# Patient Record
Sex: Female | Born: 1960 | Race: Black or African American | Hispanic: No
Health system: Midwestern US, Community
[De-identification: ages and names within clinical notes are randomized; demographics above are authoritative.]

## PROBLEM LIST (undated history)

## (undated) DIAGNOSIS — Z1231 Encounter for screening mammogram for malignant neoplasm of breast: Secondary | ICD-10-CM

## (undated) DIAGNOSIS — I4892 Unspecified atrial flutter: Secondary | ICD-10-CM

## (undated) DIAGNOSIS — G43909 Migraine, unspecified, not intractable, without status migrainosus: Secondary | ICD-10-CM

## (undated) DIAGNOSIS — T7840XA Allergy, unspecified, initial encounter: Secondary | ICD-10-CM

## (undated) DIAGNOSIS — F319 Bipolar disorder, unspecified: Secondary | ICD-10-CM

## (undated) DIAGNOSIS — K5790 Diverticulosis of intestine, part unspecified, without perforation or abscess without bleeding: Secondary | ICD-10-CM

## (undated) DIAGNOSIS — I2510 Atherosclerotic heart disease of native coronary artery without angina pectoris: Secondary | ICD-10-CM

## (undated) DIAGNOSIS — R0600 Dyspnea, unspecified: Secondary | ICD-10-CM

## (undated) DIAGNOSIS — J302 Other seasonal allergic rhinitis: Secondary | ICD-10-CM

## (undated) DIAGNOSIS — M069 Rheumatoid arthritis, unspecified: Secondary | ICD-10-CM

## (undated) DIAGNOSIS — B019 Varicella without complication: Secondary | ICD-10-CM

## (undated) DIAGNOSIS — F32A Depression, unspecified: Secondary | ICD-10-CM

## (undated) DIAGNOSIS — K760 Fatty (change of) liver, not elsewhere classified: Secondary | ICD-10-CM

## (undated) DIAGNOSIS — F329 Major depressive disorder, single episode, unspecified: Secondary | ICD-10-CM

## (undated) DIAGNOSIS — M1990 Unspecified osteoarthritis, unspecified site: Secondary | ICD-10-CM

## (undated) DIAGNOSIS — R7611 Nonspecific reaction to tuberculin skin test without active tuberculosis: Secondary | ICD-10-CM

## (undated) DIAGNOSIS — M2550 Pain in unspecified joint: Secondary | ICD-10-CM

## (undated) DIAGNOSIS — I219 Acute myocardial infarction, unspecified: Secondary | ICD-10-CM

## (undated) HISTORY — PX: TUBAL LIGATION: SHX77

## (undated) HISTORY — DX: Bipolar disorder, unspecified: F31.9

## (undated) HISTORY — DX: Other seasonal allergic rhinitis: J30.2

## (undated) HISTORY — DX: Depression, unspecified: F32.A

## (undated) HISTORY — PX: BREAST CYST EXCISION: SHX579

## (undated) HISTORY — PX: APPENDECTOMY: SHX54

## (undated) HISTORY — DX: Fatty (change of) liver, not elsewhere classified: K76.0

## (undated) HISTORY — DX: Pain in unspecified joint: M25.50

## (undated) HISTORY — DX: Dyspnea, unspecified: R06.00

## (undated) HISTORY — PX: INNER EAR SURGERY: SHX679

## (undated) HISTORY — DX: Major depressive disorder, single episode, unspecified: F32.9

## (undated) HISTORY — DX: Migraine, unspecified, not intractable, without status migrainosus: G43.909

## (undated) HISTORY — DX: Allergy, unspecified, initial encounter: T78.40XA

## (undated) HISTORY — DX: Acute myocardial infarction, unspecified: I21.9

## (undated) HISTORY — PX: ABDOMINAL HYSTERECTOMY: SHX81

## (undated) HISTORY — DX: Varicella without complication: B01.9

## (undated) HISTORY — DX: Nonspecific reaction to tuberculin skin test without active tuberculosis: R76.11

## (undated) HISTORY — PX: MYOMECTOMY: SHX85

---

## 2005-08-15 ENCOUNTER — Emergency Department (HOSPITAL_COMMUNITY): Admission: EM | Admit: 2005-08-15 | Discharge: 2005-08-15 | Payer: Self-pay | Admitting: Emergency Medicine

## 2006-02-05 ENCOUNTER — Emergency Department (HOSPITAL_COMMUNITY): Admission: EM | Admit: 2006-02-05 | Discharge: 2006-02-06 | Payer: Self-pay | Admitting: Emergency Medicine

## 2006-10-25 ENCOUNTER — Inpatient Hospital Stay (HOSPITAL_COMMUNITY): Admission: AD | Admit: 2006-10-25 | Discharge: 2006-10-25 | Payer: Self-pay | Admitting: Gynecology

## 2007-06-01 ENCOUNTER — Emergency Department (HOSPITAL_COMMUNITY): Admission: EM | Admit: 2007-06-01 | Discharge: 2007-06-01 | Payer: Self-pay | Admitting: Family Medicine

## 2008-04-06 ENCOUNTER — Emergency Department (HOSPITAL_COMMUNITY): Admission: EM | Admit: 2008-04-06 | Discharge: 2008-04-06 | Payer: Self-pay | Admitting: Emergency Medicine

## 2008-09-24 ENCOUNTER — Emergency Department (HOSPITAL_COMMUNITY): Admission: EM | Admit: 2008-09-24 | Discharge: 2008-09-24 | Payer: Self-pay | Admitting: Emergency Medicine

## 2009-03-11 ENCOUNTER — Emergency Department (HOSPITAL_COMMUNITY): Admission: EM | Admit: 2009-03-11 | Discharge: 2009-03-11 | Payer: Self-pay | Admitting: Emergency Medicine

## 2009-08-25 ENCOUNTER — Emergency Department (HOSPITAL_COMMUNITY): Admission: EM | Admit: 2009-08-25 | Discharge: 2009-08-25 | Payer: Self-pay | Admitting: Emergency Medicine

## 2009-10-15 ENCOUNTER — Emergency Department (HOSPITAL_COMMUNITY): Admission: EM | Admit: 2009-10-15 | Discharge: 2009-10-15 | Payer: Self-pay | Admitting: Emergency Medicine

## 2010-02-15 ENCOUNTER — Emergency Department (HOSPITAL_COMMUNITY): Admission: EM | Admit: 2010-02-15 | Discharge: 2010-02-15 | Payer: Self-pay | Admitting: Emergency Medicine

## 2010-06-18 ENCOUNTER — Emergency Department (HOSPITAL_COMMUNITY): Admission: EM | Admit: 2010-06-18 | Discharge: 2010-06-18 | Payer: Self-pay | Admitting: Emergency Medicine

## 2010-06-20 ENCOUNTER — Emergency Department (HOSPITAL_COMMUNITY): Admission: EM | Admit: 2010-06-20 | Discharge: 2010-06-20 | Payer: Self-pay | Admitting: Emergency Medicine

## 2010-06-22 ENCOUNTER — Emergency Department (HOSPITAL_COMMUNITY): Admission: EM | Admit: 2010-06-22 | Discharge: 2010-06-22 | Payer: Self-pay | Admitting: Emergency Medicine

## 2010-08-28 ENCOUNTER — Emergency Department (HOSPITAL_COMMUNITY): Admission: EM | Admit: 2010-08-28 | Discharge: 2010-08-28 | Payer: Self-pay | Admitting: Emergency Medicine

## 2010-09-10 ENCOUNTER — Emergency Department (HOSPITAL_COMMUNITY): Admission: EM | Admit: 2010-09-10 | Discharge: 2010-09-10 | Payer: Self-pay | Admitting: Emergency Medicine

## 2011-01-09 ENCOUNTER — Encounter: Payer: Self-pay | Admitting: Internal Medicine

## 2011-02-12 ENCOUNTER — Emergency Department (HOSPITAL_COMMUNITY)
Admission: EM | Admit: 2011-02-12 | Discharge: 2011-02-12 | Disposition: A | Discharge status: Home / Self Care | Attending: Emergency Medicine | Admitting: Emergency Medicine

## 2011-02-12 ENCOUNTER — Emergency Department (HOSPITAL_COMMUNITY)

## 2011-02-12 DIAGNOSIS — R071 Chest pain on breathing: Secondary | ICD-10-CM | POA: Insufficient documentation

## 2011-02-12 DIAGNOSIS — J45909 Unspecified asthma, uncomplicated: Secondary | ICD-10-CM | POA: Insufficient documentation

## 2011-02-12 DIAGNOSIS — E119 Type 2 diabetes mellitus without complications: Secondary | ICD-10-CM | POA: Insufficient documentation

## 2011-02-12 DIAGNOSIS — Z79899 Other long term (current) drug therapy: Secondary | ICD-10-CM | POA: Insufficient documentation

## 2011-02-12 DIAGNOSIS — F319 Bipolar disorder, unspecified: Secondary | ICD-10-CM | POA: Insufficient documentation

## 2011-02-12 LAB — DIFFERENTIAL
Basophils Relative: 0 % (ref 0–1)
Eosinophils Absolute: 0.2 10*3/uL (ref 0.0–0.7)
Eosinophils Relative: 1 % (ref 0–5)
Lymphs Abs: 5.3 10*3/uL — ABNORMAL HIGH (ref 0.7–4.0)
Neutrophils Relative %: 50 % (ref 43–77)

## 2011-02-12 LAB — POCT CARDIAC MARKERS
CKMB, poc: <1 ng/mL — ABNORMAL LOW (ref 1.0–8.0)
Myoglobin, poc: 50 ng/mL (ref 12–200)
Troponin i, poc: <0.05 ng/mL (ref 0.00–0.09)

## 2011-02-12 LAB — CBC
MCV: 84.4 fL (ref 78.0–100.0)
Platelets: 222 10*3/uL (ref 150–400)
RDW: 13.4 % (ref 11.5–15.5)
WBC: 12.5 10*3/uL — ABNORMAL HIGH (ref 4.0–10.5)

## 2011-02-12 LAB — BASIC METABOLIC PANEL
BUN: 17 mg/dL (ref 6–23)
Creatinine, Ser: 0.77 mg/dL (ref 0.4–1.2)
GFR calc Af Amer: >60 mL/min (ref >60)
GFR calc non Af Amer: >60 mL/min (ref >60)
Potassium: 3.8 mEq/L (ref 3.5–5.1)

## 2011-02-22 ENCOUNTER — Emergency Department (HOSPITAL_COMMUNITY)

## 2011-02-22 ENCOUNTER — Emergency Department (HOSPITAL_COMMUNITY)
Admission: EM | Admit: 2011-02-22 | Discharge: 2011-02-22 | Disposition: A | Discharge status: Home / Self Care | Attending: Emergency Medicine | Admitting: Emergency Medicine

## 2011-02-22 DIAGNOSIS — R112 Nausea with vomiting, unspecified: Secondary | ICD-10-CM | POA: Insufficient documentation

## 2011-02-22 DIAGNOSIS — F319 Bipolar disorder, unspecified: Secondary | ICD-10-CM | POA: Insufficient documentation

## 2011-02-22 DIAGNOSIS — R079 Chest pain, unspecified: Secondary | ICD-10-CM | POA: Insufficient documentation

## 2011-02-22 DIAGNOSIS — J45909 Unspecified asthma, uncomplicated: Secondary | ICD-10-CM | POA: Insufficient documentation

## 2011-02-22 DIAGNOSIS — Z79899 Other long term (current) drug therapy: Secondary | ICD-10-CM | POA: Insufficient documentation

## 2011-02-22 DIAGNOSIS — E119 Type 2 diabetes mellitus without complications: Secondary | ICD-10-CM | POA: Insufficient documentation

## 2011-02-22 DIAGNOSIS — R1011 Right upper quadrant pain: Secondary | ICD-10-CM | POA: Insufficient documentation

## 2011-02-22 LAB — CK TOTAL AND CKMB (NOT AT ARMC)
Relative Index: 0.6 (ref 0.0–2.5)
Total CK: 160 U/L (ref 7–177)

## 2011-02-22 LAB — DIFFERENTIAL
Basophils Absolute: 0 K/uL (ref 0.0–0.1)
Basophils Relative: 0 % (ref 0–1)
Eosinophils Absolute: 0.1 10*3/uL (ref 0.0–0.7)
Eosinophils Relative: 1 % (ref 0–5)
Lymphocytes Relative: 37 % (ref 12–46)
Lymphs Abs: 3.6 10*3/uL (ref 0.7–4.0)
Monocytes Absolute: 0.6 K/uL (ref 0.1–1.0)
Monocytes Relative: 6 % (ref 3–12)
Neutro Abs: 5.5 10*3/uL (ref 1.7–7.7)
Neutrophils Relative %: 56 % (ref 43–77)

## 2011-02-22 LAB — URINALYSIS, ROUTINE W REFLEX MICROSCOPIC
Bilirubin Urine: NEGATIVE
Glucose, UA: NEGATIVE mg/dL
Hgb urine dipstick: NEGATIVE
Ketones, ur: NEGATIVE mg/dL
Leukocytes, UA: NEGATIVE
Nitrite: NEGATIVE
Protein, ur: 30 mg/dL — AB
Specific Gravity, Urine: 1.023 (ref 1.005–1.030)
Urobilinogen, UA: 0.2 mg/dL (ref 0.0–1.0)
pH: 8 (ref 5.0–8.0)

## 2011-02-22 LAB — COMPREHENSIVE METABOLIC PANEL WITH GFR
BUN: 9 mg/dL (ref 6–23)
CO2: 28 meq/L (ref 19–32)
Calcium: 9.5 mg/dL (ref 8.4–10.5)
Chloride: 105 meq/L (ref 96–112)
Creatinine, Ser: 0.79 mg/dL (ref 0.4–1.2)
GFR calc Af Amer: >60 mL/min (ref >60)
GFR calc non Af Amer: >60 mL/min (ref >60)
Glucose, Bld: 126 mg/dL — ABNORMAL HIGH (ref 70–99)
Total Bilirubin: 0.8 mg/dL (ref 0.3–1.2)

## 2011-02-22 LAB — CBC
HCT: 40.7 % (ref 36.0–46.0)
Hemoglobin: 13.3 g/dL (ref 12.0–15.0)
MCH: 27.2 pg (ref 26.0–34.0)
MCHC: 32.7 g/dL (ref 30.0–36.0)
MCV: 83.2 fL (ref 78.0–100.0)
Platelets: 216 10*3/uL (ref 150–400)
RBC: 4.89 MIL/uL (ref 3.87–5.11)
RDW: 13 % (ref 11.5–15.5)
WBC: 9.8 10*3/uL (ref 4.0–10.5)

## 2011-02-22 LAB — LIPASE, BLOOD: Lipase: 23 U/L (ref 11–59)

## 2011-02-22 LAB — COMPREHENSIVE METABOLIC PANEL
ALT: 29 U/L (ref 0–35)
AST: 23 U/L (ref 0–37)
Albumin: 3.8 g/dL (ref 3.5–5.2)
Alkaline Phosphatase: 75 U/L (ref 39–117)
Potassium: 3.9 mEq/L (ref 3.5–5.1)
Sodium: 141 mEq/L (ref 135–145)
Total Protein: 7.2 g/dL (ref 6.0–8.3)

## 2011-02-22 LAB — URINE MICROSCOPIC-ADD ON

## 2011-02-22 LAB — POCT PREGNANCY, URINE: Preg Test, Ur: NEGATIVE

## 2011-02-22 LAB — TROPONIN I: Troponin I: 0.01 ng/mL (ref 0.00–0.06)

## 2011-03-02 LAB — PULMONARY FUNCTION TEST

## 2011-03-06 LAB — URINALYSIS, ROUTINE W REFLEX MICROSCOPIC
Bilirubin Urine: NEGATIVE
Ketones, ur: NEGATIVE mg/dL
Nitrite: NEGATIVE
pH: 5.5 (ref 5.0–8.0)

## 2011-03-06 LAB — POCT CARDIAC MARKERS
CKMB, poc: <1 ng/mL — ABNORMAL LOW (ref 1.0–8.0)
CKMB, poc: <1 ng/mL — ABNORMAL LOW (ref 1.0–8.0)
Myoglobin, poc: 56.1 ng/mL (ref 12–200)
Troponin i, poc: <0.05 ng/mL (ref 0.00–0.09)

## 2011-03-06 LAB — HEPATIC FUNCTION PANEL
Albumin: 3.5 g/dL (ref 3.5–5.2)
Indirect Bilirubin: 0.4 mg/dL (ref 0.3–0.9)
Total Bilirubin: 0.7 mg/dL (ref 0.3–1.2)
Total Protein: 6.8 g/dL (ref 6.0–8.3)

## 2011-03-06 LAB — POCT I-STAT, CHEM 8
Creatinine, Ser: 0.9 mg/dL (ref 0.4–1.2)
HCT: 41 % (ref 36.0–46.0)
Hemoglobin: 13.9 g/dL (ref 12.0–15.0)
Potassium: 3.8 mEq/L (ref 3.5–5.1)
Sodium: 141 mEq/L (ref 135–145)

## 2011-03-06 LAB — CBC
HCT: 42.1 % (ref 36.0–46.0)
Hemoglobin: 14.2 g/dL (ref 12.0–15.0)
RBC: 4.95 MIL/uL (ref 3.87–5.11)
WBC: 12.5 10*3/uL — ABNORMAL HIGH (ref 4.0–10.5)

## 2011-03-06 LAB — DIFFERENTIAL
Lymphocytes Relative: 31 % (ref 12–46)
Monocytes Absolute: 0.7 10*3/uL (ref 0.1–1.0)
Monocytes Relative: 5 % (ref 3–12)
Neutro Abs: 7.8 10*3/uL — ABNORMAL HIGH (ref 1.7–7.7)
Neutrophils Relative %: 62 % (ref 43–77)

## 2011-03-06 LAB — BASIC METABOLIC PANEL
GFR calc Af Amer: >60 mL/min (ref >60)
GFR calc non Af Amer: >60 mL/min (ref >60)
Glucose, Bld: 130 mg/dL — ABNORMAL HIGH (ref 70–99)
Potassium: 3.7 mEq/L (ref 3.5–5.1)
Sodium: 138 mEq/L (ref 135–145)

## 2011-03-06 LAB — LIPASE, BLOOD: Lipase: 48 U/L (ref 11–59)

## 2011-03-06 LAB — URINE MICROSCOPIC-ADD ON

## 2011-03-15 ENCOUNTER — Emergency Department (HOSPITAL_COMMUNITY)
Admission: EM | Admit: 2011-03-15 | Discharge: 2011-03-15 | Disposition: A | Discharge status: Home / Self Care | Attending: Emergency Medicine | Admitting: Emergency Medicine

## 2011-03-15 ENCOUNTER — Emergency Department (HOSPITAL_COMMUNITY)

## 2011-03-15 DIAGNOSIS — L298 Other pruritus: Secondary | ICD-10-CM | POA: Insufficient documentation

## 2011-03-15 DIAGNOSIS — J309 Allergic rhinitis, unspecified: Secondary | ICD-10-CM | POA: Insufficient documentation

## 2011-03-15 DIAGNOSIS — L2989 Other pruritus: Secondary | ICD-10-CM | POA: Insufficient documentation

## 2011-03-15 DIAGNOSIS — R071 Chest pain on breathing: Secondary | ICD-10-CM | POA: Insufficient documentation

## 2011-03-15 DIAGNOSIS — J45909 Unspecified asthma, uncomplicated: Secondary | ICD-10-CM | POA: Insufficient documentation

## 2011-03-15 DIAGNOSIS — Z79899 Other long term (current) drug therapy: Secondary | ICD-10-CM | POA: Insufficient documentation

## 2011-03-15 DIAGNOSIS — F319 Bipolar disorder, unspecified: Secondary | ICD-10-CM | POA: Insufficient documentation

## 2011-03-15 DIAGNOSIS — R05 Cough: Secondary | ICD-10-CM | POA: Insufficient documentation

## 2011-03-15 DIAGNOSIS — E119 Type 2 diabetes mellitus without complications: Secondary | ICD-10-CM | POA: Insufficient documentation

## 2011-03-15 DIAGNOSIS — R059 Cough, unspecified: Secondary | ICD-10-CM | POA: Insufficient documentation

## 2011-03-15 DIAGNOSIS — R0989 Other specified symptoms and signs involving the circulatory and respiratory systems: Secondary | ICD-10-CM | POA: Insufficient documentation

## 2011-03-15 DIAGNOSIS — J3489 Other specified disorders of nose and nasal sinuses: Secondary | ICD-10-CM | POA: Insufficient documentation

## 2011-03-15 DIAGNOSIS — R0609 Other forms of dyspnea: Secondary | ICD-10-CM | POA: Insufficient documentation

## 2011-03-20 ENCOUNTER — Encounter (HOSPITAL_COMMUNITY): Payer: Self-pay | Admitting: Radiology

## 2011-03-20 ENCOUNTER — Emergency Department (HOSPITAL_COMMUNITY)

## 2011-03-20 ENCOUNTER — Emergency Department (HOSPITAL_COMMUNITY)
Admission: EM | Admit: 2011-03-20 | Discharge: 2011-03-20 | Disposition: A | Discharge status: Home / Self Care | Attending: Emergency Medicine | Admitting: Emergency Medicine

## 2011-03-20 DIAGNOSIS — R0989 Other specified symptoms and signs involving the circulatory and respiratory systems: Secondary | ICD-10-CM | POA: Insufficient documentation

## 2011-03-20 DIAGNOSIS — E119 Type 2 diabetes mellitus without complications: Secondary | ICD-10-CM | POA: Insufficient documentation

## 2011-03-20 DIAGNOSIS — Z79899 Other long term (current) drug therapy: Secondary | ICD-10-CM | POA: Insufficient documentation

## 2011-03-20 DIAGNOSIS — F319 Bipolar disorder, unspecified: Secondary | ICD-10-CM | POA: Insufficient documentation

## 2011-03-20 DIAGNOSIS — R0602 Shortness of breath: Secondary | ICD-10-CM | POA: Insufficient documentation

## 2011-03-20 DIAGNOSIS — R071 Chest pain on breathing: Secondary | ICD-10-CM | POA: Insufficient documentation

## 2011-03-20 DIAGNOSIS — R0609 Other forms of dyspnea: Secondary | ICD-10-CM | POA: Insufficient documentation

## 2011-03-20 DIAGNOSIS — J45909 Unspecified asthma, uncomplicated: Secondary | ICD-10-CM | POA: Insufficient documentation

## 2011-03-20 MED ORDER — IOHEXOL 300 MG/ML  SOLN
100.0000 mL | Freq: Once | INTRAMUSCULAR | Status: AC | PRN
  Administered 2011-03-20: 100 mL via INTRAVENOUS

## 2011-03-24 LAB — URINE CULTURE: Colony Count: 30000

## 2011-03-24 LAB — URINALYSIS, ROUTINE W REFLEX MICROSCOPIC
Bilirubin Urine: NEGATIVE
Ketones, ur: 15 mg/dL — AB
Leukocytes, UA: NEGATIVE
Nitrite: NEGATIVE
Urobilinogen, UA: 1 mg/dL (ref 0.0–1.0)
pH: 5.5 (ref 5.0–8.0)

## 2011-03-24 LAB — PREGNANCY, URINE: Preg Test, Ur: NEGATIVE

## 2011-03-24 LAB — URINE MICROSCOPIC-ADD ON

## 2011-03-31 LAB — POCT CARDIAC MARKERS
Myoglobin, poc: 47.8 ng/mL (ref 12–200)
Troponin i, poc: <0.05 ng/mL (ref 0.00–0.09)
Troponin i, poc: <0.05 ng/mL (ref 0.00–0.09)

## 2011-03-31 LAB — POCT I-STAT, CHEM 8
BUN: 16 mg/dL (ref 6–23)
Calcium, Ion: 1.18 mmol/L (ref 1.12–1.32)
Chloride: 105 mEq/L (ref 96–112)
Creatinine, Ser: 0.9 mg/dL (ref 0.4–1.2)
Glucose, Bld: 91 mg/dL (ref 70–99)
TCO2: 27 mmol/L (ref 0–100)

## 2011-03-31 LAB — CBC
HCT: 39.4 % (ref 36.0–46.0)
Hemoglobin: 13.5 g/dL (ref 12.0–15.0)
RBC: 4.66 MIL/uL (ref 3.87–5.11)
WBC: 8.9 10*3/uL (ref 4.0–10.5)

## 2011-03-31 LAB — DIFFERENTIAL
Eosinophils Absolute: 0.1 10*3/uL (ref 0.0–0.7)
Eosinophils Relative: 2 % (ref 0–5)
Lymphocytes Relative: 34 % (ref 12–46)
Lymphs Abs: 3 10*3/uL (ref 0.7–4.0)
Monocytes Absolute: 0.6 10*3/uL (ref 0.1–1.0)
Monocytes Relative: 7 % (ref 3–12)

## 2011-03-31 LAB — POCT PREGNANCY, URINE: Preg Test, Ur: NEGATIVE

## 2011-03-31 LAB — D-DIMER, QUANTITATIVE: D-Dimer, Quant: 0.23 ug/mL-FEU (ref 0.00–0.48)

## 2011-04-12 ENCOUNTER — Encounter: Payer: Self-pay | Admitting: Pulmonary Disease

## 2011-04-13 ENCOUNTER — Encounter: Payer: Self-pay | Admitting: Pulmonary Disease

## 2011-04-13 ENCOUNTER — Ambulatory Visit (INDEPENDENT_AMBULATORY_CARE_PROVIDER_SITE_OTHER): Admitting: Pulmonary Disease

## 2011-04-13 VITALS — BP 114/64 | HR 73 | Temp 98.4°F

## 2011-04-13 DIAGNOSIS — R0600 Dyspnea, unspecified: Secondary | ICD-10-CM

## 2011-04-13 DIAGNOSIS — R0609 Other forms of dyspnea: Secondary | ICD-10-CM

## 2011-04-13 HISTORY — DX: Dyspnea, unspecified: R06.00

## 2011-04-13 NOTE — Assessment & Plan Note (Addendum)
It is really unclear from the pt's history whether she truly has asthma, or whether this is more of an issue with reflux disease given her atypical chest pain and now hoarseness (although the dysphonia could be from qvar).  I would be hard pressed to commit her to lifelong asthma therapy unless we have something to indicate she has airflow obstruction.  Her recent spiro is normal.  At this point, would like to treat her more aggressively for LPR, and will also set up for methacholine challenge testing after she has been off qvar for 2 weeks. The pt is agreeable to this approach.

## 2011-04-13 NOTE — Progress Notes (Signed)
  Subjective:    Patient ID: Kerri Osborn, female    DOB: [birth year], 50 y.o.   MRN: [id number]  HPI The pt is a 49y/o female who I have been asked to see for dyspnea and atypical cp.  She has a h/o "asthma" dating back 29yrs, but doesn't remember if she has ever had abnormal pfts/spiro.  She has been on prn albuterol only since that time, with breathing issues only periodically.  Starting in Feb of this year, she began to notice worsening sob along with sharp mid chest discomfort.  She had a ct chest recently with no PE, and totally clear lung fields.  She has had spirometry as part of recent Met-test, and these were normal.  She has upcoming cardiac evaluation with Dr. Jacinto Halim.  She has been tried on qvar most recently for last 2 1/2 weeks, and is really unsure if it has helped.  She currently describes a 1 1/2 block doe at moderate pace on flat ground, and will get winded bringing groceries in from the car but not sweeping.  She has had a recent cough that is dry, and now has developed hoarseness/dysphonia.  She denies reflux symptoms, but does have postnasal drip and feels there is something in her throat "rattling".  She denies significant sinus or allergy symptoms.  She does state her weight is up 35 pounds the last one year, but down 15 the last 3 mos.    Review of Systems  Constitutional: Positive for unexpected weight change. Negative for fever.  HENT: Negative for ear pain, nosebleeds, congestion, sore throat, rhinorrhea, sneezing, trouble swallowing, dental problem, postnasal drip and sinus pressure.   Eyes: Negative for redness and itching.  Respiratory: Positive for cough and shortness of breath. Negative for chest tightness and wheezing.   Cardiovascular: Positive for chest pain. Negative for palpitations and leg swelling.  Gastrointestinal: Negative for nausea and vomiting.  Genitourinary: Negative for dysuria.  Musculoskeletal: Negative for joint swelling.  Skin: Negative for rash.    Neurological: Negative for headaches.  Hematological: Does not bruise/bleed easily.  Psychiatric/Behavioral: Positive for dysphoric mood. The patient is nervous/anxious.        Objective:   Physical Exam Constitutional:  Obese female, no acute distress  HENT:  Nares patent without discharge, but deviated septum to right with inflammed mucosa bilat  Oropharynx without exudate, palate and uvula are normal  Eyes:  Perrla, eomi, no scleral icterus  Neck:  No JVD, no TMG  Cardiovascular:  Normal rate, regular rhythm, no rubs or gallops.  No murmurs        Intact distal pulses  Pulmonary :  Normal breath sounds, no stridor or respiratory distress   No rales, rhonchi, or wheezing  Abdominal:  Soft, nondistended, bowel sounds present.  No tenderness noted.   Musculoskeletal:  No lower extremity edema noted.  Lymph Nodes:  No cervical lymphadenopathy noted  Skin:  No cyanosis noted  Neurologic:  Alert, appropriate, moves all 4 extremities without obvious deficit.         Assessment & Plan:

## 2011-04-13 NOTE — Patient Instructions (Signed)
Stop qvar, but can continue albuterol if needed nexium 40mg  one in am and pm for next 2 weeks Take chlorpheniramine 8mg  at bedtime each night for next 2 weeks.  Will schedule for methacholine challenge testing to put the issue of asthma to rest. I will call you with results. Please call if you have worsening breathing issues leading up to your testing.

## 2011-04-18 ENCOUNTER — Ambulatory Visit (INDEPENDENT_AMBULATORY_CARE_PROVIDER_SITE_OTHER): Admitting: Pulmonary Disease

## 2011-04-18 ENCOUNTER — Encounter: Payer: Self-pay | Admitting: Pulmonary Disease

## 2011-04-18 ENCOUNTER — Telehealth: Payer: Self-pay | Admitting: Pulmonary Disease

## 2011-04-18 VITALS — BP 122/70 | HR 82 | Temp 98.5°F | Ht 63.0 in | Wt 204.0 lb

## 2011-04-18 DIAGNOSIS — R0989 Other specified symptoms and signs involving the circulatory and respiratory systems: Secondary | ICD-10-CM

## 2011-04-18 DIAGNOSIS — R05 Cough: Secondary | ICD-10-CM

## 2011-04-18 DIAGNOSIS — R0600 Dyspnea, unspecified: Secondary | ICD-10-CM

## 2011-04-18 MED ORDER — HYDROCOD POLST-CPM POLST ER 10-8 MG PO CP12: 1.0000 | ORAL_CAPSULE | Freq: Two times a day (BID) | ORAL | Status: DC | PRN

## 2011-04-18 MED ORDER — BENZONATATE 100 MG PO CAPS: 200.0000 mg | ORAL_CAPSULE | Freq: Four times a day (QID) | ORAL | Status: AC | PRN

## 2011-04-18 NOTE — Telephone Encounter (Signed)
Pt walked in and requested to be seen.  Therefore pt put on Kerri Osborn's schedule to see Kerri Osborn at 2:15pm.

## 2011-04-18 NOTE — Patient Instructions (Addendum)
See cyclical cough protocol Keep hard candy in your mouth at all times Will cancel methacholine challenge testing Please call me with how things are going one week after completing protocol

## 2011-04-18 NOTE — Progress Notes (Signed)
  Subjective:    Patient ID: Kerri Osborn, female    DOB: Dec 08, 1961, 50 y.o.   MRN: [id number]  HPI The pt comes in today for an acute sick visit.  Last visit, she had symptoms that were primarily suggestive of upper airway disease, but also had a longstanding h/o "asthma".  She was treated for LPR and PND, and scheduled for a methacholine challenge test off qvar.  She comes in today where she has had worsening dry cough, to the point of vomiting.  She has also had both chest heaviness and sharp pain, and feels she is getting more sob.  She has been taking PPI and antihistamine religiously .    Review of Systems  Constitutional: Negative for fever and unexpected weight change.  HENT: Positive for sore throat and sneezing. Negative for ear pain, nosebleeds, congestion, rhinorrhea, trouble swallowing, dental problem, postnasal drip and sinus pressure.   Eyes: Negative for redness and itching.  Respiratory: Positive for cough, chest tightness, shortness of breath and wheezing.   Cardiovascular: Positive for leg swelling. Negative for palpitations.  Gastrointestinal: Positive for nausea. Negative for vomiting.  Genitourinary: Negative for dysuria.  Musculoskeletal: Positive for joint swelling.  Skin: Positive for rash.  Neurological: Positive for light-headedness and headaches.  Hematological: Does not bruise/bleed easily.  Psychiatric/Behavioral: Positive for dysphoric mood. The patient is nervous/anxious.        Objective:   Physical Exam Ow female in nad Nares without purulence or discharge Chest with mild decrease in bs, no wheezing or rhonchi Cor with rrr,  LE without edema, no cyanosis noted.  Alert and oriented, moves all 4        Assessment & Plan:

## 2011-04-18 NOTE — Telephone Encounter (Signed)
Spoke w/ pt and she states she is not feeling any better from 04/13/11 OV w/ KC. Pt c/o hoarseness, dry cough, chest tightness, wheezing. Pt states her cough is constant and does not go away. Pt states she stopped her QVAR, is taking Nexium bid, chlorpheniramine 8 mg at bedtime. Pt states she has been using her albuterol prn but feels it's not really helping. Please advise recs for pt. Thanks  Allergies  Allergen Reactions  . Amoxicillin     Carver Fila, CMA

## 2011-04-24 NOTE — Assessment & Plan Note (Signed)
I suspect is related to her constant coughing and MSK chest discomfort.

## 2011-04-24 NOTE — Assessment & Plan Note (Signed)
I continue to believe the pt's cough is upper airway in origin, and now has taken on a cyclical nature.  I have found no evidence to support the diagnosis of asthma thus far.  I would like her to continue with her PPI and antihistamine, and will now put her on the cyclical cough protocol to try and "break the cycle".  With her current severe coughing, she will not be able to perform a methacholine challenge test, and therefore will cancel for now.

## 2011-04-27 ENCOUNTER — Encounter (HOSPITAL_COMMUNITY)

## 2011-08-29 ENCOUNTER — Emergency Department (HOSPITAL_COMMUNITY)
Admission: EM | Admit: 2011-08-29 | Discharge: 2011-08-30 | Disposition: A | Discharge status: Home / Self Care | Attending: Emergency Medicine | Admitting: Emergency Medicine

## 2011-08-29 DIAGNOSIS — X58XXXA Exposure to other specified factors, initial encounter: Secondary | ICD-10-CM | POA: Insufficient documentation

## 2011-08-29 DIAGNOSIS — R109 Unspecified abdominal pain: Secondary | ICD-10-CM | POA: Insufficient documentation

## 2011-08-29 DIAGNOSIS — E119 Type 2 diabetes mellitus without complications: Secondary | ICD-10-CM | POA: Insufficient documentation

## 2011-08-29 DIAGNOSIS — J45909 Unspecified asthma, uncomplicated: Secondary | ICD-10-CM | POA: Insufficient documentation

## 2011-08-29 DIAGNOSIS — IMO0002 Reserved for concepts with insufficient information to code with codable children: Secondary | ICD-10-CM | POA: Insufficient documentation

## 2011-08-29 DIAGNOSIS — F319 Bipolar disorder, unspecified: Secondary | ICD-10-CM | POA: Insufficient documentation

## 2011-09-04 ENCOUNTER — Emergency Department (HOSPITAL_COMMUNITY)
Admission: EM | Admit: 2011-09-04 | Discharge: 2011-09-04 | Disposition: A | Discharge status: Home / Self Care | Attending: Emergency Medicine | Admitting: Emergency Medicine

## 2011-09-04 DIAGNOSIS — E119 Type 2 diabetes mellitus without complications: Secondary | ICD-10-CM | POA: Insufficient documentation

## 2011-09-04 DIAGNOSIS — F319 Bipolar disorder, unspecified: Secondary | ICD-10-CM | POA: Insufficient documentation

## 2011-09-04 DIAGNOSIS — Z79899 Other long term (current) drug therapy: Secondary | ICD-10-CM | POA: Insufficient documentation

## 2011-09-04 LAB — DIFFERENTIAL
Lymphs Abs: 5.2 10*3/uL — ABNORMAL HIGH (ref 0.7–4.0)
Monocytes Absolute: 0.6 10*3/uL (ref 0.1–1.0)
Monocytes Relative: 6 % (ref 3–12)
Neutro Abs: 3.7 10*3/uL (ref 1.7–7.7)
Neutrophils Relative %: 38 % — ABNORMAL LOW (ref 43–77)

## 2011-09-04 LAB — POCT I-STAT, CHEM 8
BUN: 11 mg/dL (ref 6–23)
Calcium, Ion: 1.28 mmol/L (ref 1.12–1.32)
Creatinine, Ser: 0.6 mg/dL (ref 0.50–1.10)
Glucose, Bld: 214 mg/dL — ABNORMAL HIGH (ref 70–99)
TCO2: 27 mmol/L (ref 0–100)

## 2011-09-04 LAB — CBC
HCT: 43.2 % (ref 36.0–46.0)
Hemoglobin: 14.8 g/dL (ref 12.0–15.0)
MCH: 28 pg (ref 26.0–34.0)
MCHC: 34.3 g/dL (ref 30.0–36.0)
MCV: 81.7 fL (ref 78.0–100.0)
RBC: 5.29 MIL/uL — ABNORMAL HIGH (ref 3.87–5.11)

## 2011-09-04 LAB — URINALYSIS, ROUTINE W REFLEX MICROSCOPIC
Bilirubin Urine: NEGATIVE
Nitrite: NEGATIVE
Protein, ur: 100 mg/dL — AB
Urobilinogen, UA: 0.2 mg/dL (ref 0.0–1.0)

## 2011-09-04 LAB — GLUCOSE, CAPILLARY: Glucose-Capillary: 297 mg/dL — ABNORMAL HIGH (ref 70–99)

## 2011-09-04 LAB — URINE MICROSCOPIC-ADD ON

## 2011-10-27 ENCOUNTER — Encounter (HOSPITAL_COMMUNITY): Payer: Self-pay | Admitting: *Deleted

## 2011-10-27 ENCOUNTER — Emergency Department (HOSPITAL_COMMUNITY)
Admission: EM | Admit: 2011-10-27 | Discharge: 2011-10-27 | Disposition: A | Discharge status: Home / Self Care | Attending: Emergency Medicine | Admitting: Emergency Medicine

## 2011-10-27 DIAGNOSIS — K029 Dental caries, unspecified: Secondary | ICD-10-CM | POA: Insufficient documentation

## 2011-10-27 DIAGNOSIS — F319 Bipolar disorder, unspecified: Secondary | ICD-10-CM | POA: Insufficient documentation

## 2011-10-27 DIAGNOSIS — E119 Type 2 diabetes mellitus without complications: Secondary | ICD-10-CM | POA: Insufficient documentation

## 2011-10-27 DIAGNOSIS — J45909 Unspecified asthma, uncomplicated: Secondary | ICD-10-CM | POA: Insufficient documentation

## 2011-10-27 DIAGNOSIS — K089 Disorder of teeth and supporting structures, unspecified: Secondary | ICD-10-CM | POA: Insufficient documentation

## 2011-10-27 MED ORDER — HYDROCODONE-ACETAMINOPHEN 5-325 MG PO TABS: 1.0000 | ORAL_TABLET | ORAL | Status: AC | PRN

## 2011-10-27 NOTE — ED Provider Notes (Signed)
History     CSN: [id number] Arrival date & time: 10/27/2011  4:40 PM   First MD Initiated Contact with Patient 10/27/11 1807      Chief Complaint  Patient presents with  . Dental Pain    (Consider location/radiation/quality/duration/timing/severity/associated sxs/prior treatment) Patient is a 50 y.o. female presenting with tooth pain. The history is provided by the patient.  Dental PainMouth pain: patient states she has had tooth pain for a week now.  By her dentist, who had her set up with [facility] for a root canal and now will be done tomorrow.  She.   she is here because the pain got significant and would like pain control until she can see them tomorrow.  Patient denies any throat swelling difficulty breathing, difficulty swallowing, chest pain, shortness of breath, fever, nausea, vomiting, or diarrhea.    Past Medical History  Diagnosis Date  . Diabetes mellitus   . Bipolar disorder   . Asthma     Past Surgical History  Procedure Date  . Abdominal hysterectomy   . Tubal ligation   . Appendectomy   . Myomectomy   . Cesarean section   . Breast cyst excision   . Inner ear surgery     tubes in ears    Family History  Problem Relation Age of Onset  . Allergies Sister   . Allergies Daughter   . Allergies Daughter   . Asthma Daughter   . Asthma Daughter     History  Substance Use Topics  . Smoking status: Never Smoker   . Smokeless tobacco: Not on file  . Alcohol Use: No    OB History    Grav Para Term Preterm Abortions TAB SAB Ect Mult Living                  Review of Systems  All other systems reviewed and are negative.    Allergies  Amoxicillin  Home Medications   Current Outpatient Rx  Name Route Sig Dispense Refill  . ASPIRIN 81 MG PO TABS Oral Take 81 mg by mouth daily.      [name] Kitchen ESTRADIOL 0.5 MG/0.5GM TD GEL Topical Apply 1 application topically daily.     . IBUPROFEN 200 MG PO TABS Oral Take 400 mg by mouth every 6 (six) hours as  needed. For pain     . LAMOTRIGINE 100 MG PO TABS Oral Take 100 mg by mouth daily.      [name] Kitchen METOPROLOL SUCCINATE 25 MG PO TB24 Oral Take 25 mg by mouth daily.      [name] Kitchen NAPROXEN SODIUM 220 MG PO CAPS Oral Take 1 capsule by mouth daily as needed. For pain    . PIOGLITAZONE HCL 15 MG PO TABS Oral Take 15 mg by mouth daily.      . SERTRALINE HCL 25 MG PO TABS Oral Take 25 mg by mouth daily.      [name] Kitchen SITAGLIPTIN-METFORMIN HCL 50-1000 MG PO TABS Oral Take 1 tablet by mouth 2 (two) times daily with a meal.      . ALBUTEROL SULFATE HFA 108 (90 BASE) MCG/ACT IN AERS Inhalation Inhale 2 puffs into the lungs 2 (two) times daily as needed. For shortness of breath    . ALBUTEROL SULFATE (2.5 MG/3ML) 0.083% IN NEBU Nebulization Take 2.5 mg by nebulization every 6 (six) hours as needed. For shortness of breath      BP 187/95  Pulse 87  Resp 22  SpO2 99%  Physical  Exam  Constitutional: She appears well-developed and well-nourished. She appears distressed.  HENT:  Head: Normocephalic and atraumatic. No trismus in the jaw.  Mouth/Throat: Uvula is midline. Dental caries present. No uvula swelling.      ED Course  Procedures (including critical care time)  Labs Reviewed - No data to display No results found.   No diagnosis found.  The patient will be seen tomorrow by a dentist.  MDM  The patient has no abscess noted. No swelling in the mouth.        Carlyle Dolly, [place] 10/27/11 1825

## 2011-10-27 NOTE — ED Notes (Signed)
Patient complains of toothache on the left side that started today.  Patient state the pain is in her upper tooth.  She is scheduled for root canal tomorrow but states she cannot wait

## 2011-10-27 NOTE — ED Provider Notes (Signed)
Medical screening examination/treatment/procedure(s) were performed by non-physician practitioner and as supervising physician I was immediately available for consultation/collaboration.   Celene Kras, MD 10/27/11 [phone]

## 2011-10-27 NOTE — ED Notes (Signed)
Family at bedside. 

## 2011-12-19 ENCOUNTER — Emergency Department (HOSPITAL_COMMUNITY)
Admission: EM | Admit: 2011-12-19 | Discharge: 2011-12-19 | Disposition: A | Discharge status: Home / Self Care | Attending: Emergency Medicine | Admitting: Emergency Medicine

## 2011-12-19 ENCOUNTER — Encounter (HOSPITAL_COMMUNITY): Payer: Self-pay | Admitting: *Deleted

## 2011-12-19 DIAGNOSIS — F319 Bipolar disorder, unspecified: Secondary | ICD-10-CM | POA: Insufficient documentation

## 2011-12-19 DIAGNOSIS — R209 Unspecified disturbances of skin sensation: Secondary | ICD-10-CM | POA: Insufficient documentation

## 2011-12-19 DIAGNOSIS — M2550 Pain in unspecified joint: Secondary | ICD-10-CM

## 2011-12-19 DIAGNOSIS — M25539 Pain in unspecified wrist: Secondary | ICD-10-CM | POA: Insufficient documentation

## 2011-12-19 DIAGNOSIS — L298 Other pruritus: Secondary | ICD-10-CM | POA: Insufficient documentation

## 2011-12-19 DIAGNOSIS — M79609 Pain in unspecified limb: Secondary | ICD-10-CM | POA: Insufficient documentation

## 2011-12-19 DIAGNOSIS — H5789 Other specified disorders of eye and adnexa: Secondary | ICD-10-CM | POA: Insufficient documentation

## 2011-12-19 DIAGNOSIS — E119 Type 2 diabetes mellitus without complications: Secondary | ICD-10-CM | POA: Insufficient documentation

## 2011-12-19 DIAGNOSIS — J45909 Unspecified asthma, uncomplicated: Secondary | ICD-10-CM | POA: Insufficient documentation

## 2011-12-19 DIAGNOSIS — H11429 Conjunctival edema, unspecified eye: Secondary | ICD-10-CM | POA: Insufficient documentation

## 2011-12-19 DIAGNOSIS — L2989 Other pruritus: Secondary | ICD-10-CM | POA: Insufficient documentation

## 2011-12-19 DIAGNOSIS — Z794 Long term (current) use of insulin: Secondary | ICD-10-CM | POA: Insufficient documentation

## 2011-12-19 LAB — BASIC METABOLIC PANEL
CO2: 26 mEq/L (ref 19–32)
Calcium: 9.4 mg/dL (ref 8.4–10.5)
GFR calc Af Amer: >90 mL/min (ref >90)
GFR calc non Af Amer: >90 mL/min (ref >90)
Sodium: 140 mEq/L (ref 135–145)

## 2011-12-19 LAB — CBC
MCV: 84.2 fL (ref 78.0–100.0)
Platelets: 215 10*3/uL (ref 150–400)
RDW: 13.4 % (ref 11.5–15.5)
WBC: 12.7 10*3/uL — ABNORMAL HIGH (ref 4.0–10.5)

## 2011-12-19 LAB — CK: Total CK: 228 U/L — ABNORMAL HIGH (ref 7–177)

## 2011-12-19 LAB — DIFFERENTIAL
Basophils Absolute: 0 10*3/uL (ref 0.0–0.1)
Eosinophils Absolute: 0.2 10*3/uL (ref 0.0–0.7)
Eosinophils Relative: 2 % (ref 0–5)
Lymphocytes Relative: 45 % (ref 12–46)

## 2011-12-19 LAB — SEDIMENTATION RATE: Sed Rate: 9 mm/hr (ref 0–22)

## 2011-12-19 MED ORDER — OXYCODONE-ACETAMINOPHEN 5-325 MG PO TABS: 1.0000 | ORAL_TABLET | Freq: Four times a day (QID) | ORAL | Status: AC | PRN

## 2011-12-19 MED ORDER — OXYCODONE-ACETAMINOPHEN 5-325 MG PO TABS
1.0000 | ORAL_TABLET | Freq: Once | ORAL | Status: AC
  Administered 2011-12-19: 1 via ORAL
  Filled 2011-12-19: qty 1

## 2011-12-19 MED ORDER — PREDNISONE 20 MG PO TABS: 40.0000 mg | ORAL_TABLET | Freq: Every day | ORAL | Status: AC

## 2011-12-19 NOTE — ED Provider Notes (Signed)
History     CSN: [id number]  Arrival date & time 12/19/11  1218   First MD Initiated Contact with Patient 12/19/11 1500      Chief Complaint  Patient presents with  . Arm Pain  . Numbness    (Consider location/radiation/quality/duration/timing/severity/associated sxs/prior treatment) HPI Comments: Patient with worsening joint pain most severe in the bilateral hands and wrists that is now a 9/10 with stiffness in the morning then improves as the day goes on. Pain with the use but no specific weakness or numbness. She states at times her hands feel tight and swollen and better when she raises them. Also several days ago she developed redness and itching of her right eye. She denies any visual changes, fever, cough, shortness of breath, abdominal pain, nausea or vomiting. She states her blood sugar has been good.  Patient is a 50 y.o. female presenting with arm pain. The history is provided by the patient.  Arm Pain This is a new problem. The current episode started more than 1 week ago. The problem occurs constantly. The problem has been gradually worsening.    Past Medical History  Diagnosis Date  . Diabetes mellitus   . Bipolar disorder   . Asthma     Past Surgical History  Procedure Date  . Abdominal hysterectomy   . Tubal ligation   . Appendectomy   . Myomectomy   . Cesarean section   . Breast cyst excision   . Inner ear surgery     tubes in ears    Family History  Problem Relation Age of Onset  . Allergies Sister   . Allergies Daughter   . Allergies Daughter   . Asthma Daughter   . Asthma Daughter     History  Substance Use Topics  . Smoking status: Never Smoker   . Smokeless tobacco: Not on file  . Alcohol Use: No    OB History    Grav Para Term Preterm Abortions TAB SAB Ect Mult Living                  Review of Systems  Eyes: Positive for pain, redness and itching. Negative for discharge.  All other systems reviewed and are  negative.    Allergies  Amoxicillin  Home Medications   Current Outpatient Rx  Name Route Sig Dispense Refill  . ALBUTEROL SULFATE HFA 108 (90 BASE) MCG/ACT IN AERS Inhalation Inhale 2 puffs into the lungs 2 (two) times daily as needed. For shortness of breath    . ASPIRIN 81 MG PO TABS Oral Take 81 mg by mouth daily.      [name] Kitchen ESTRADIOL 0.5 MG/0.5GM TD GEL Topical Apply 1 application topically daily.     . INSULIN DETEMIR 100 UNIT/ML [place] SOLN Subcutaneous Inject into the skin at bedtime. Sliding scale     . LAMOTRIGINE 100 MG PO TABS Oral Take 100 mg by mouth daily.      [name] Kitchen NAPROXEN SODIUM 220 MG PO CAPS Oral Take 1 capsule by mouth daily as needed. For pain    . PIOGLITAZONE HCL 15 MG PO TABS Oral Take 15 mg by mouth daily.      . SERTRALINE HCL 25 MG PO TABS Oral Take 25 mg by mouth daily.      [name] Kitchen SITAGLIPTIN-METFORMIN HCL 50-1000 MG PO TABS Oral Take 1 tablet by mouth 2 (two) times daily with a meal.        BP 108/69  Pulse 67  Temp(Src) 98.1  F (36.7 C) (Oral)  Resp 16  SpO2 100%  Physical Exam  Nursing note and vitals reviewed. Constitutional: She is oriented to person, place, and time. She appears well-developed and well-nourished. No distress.  HENT:  Head: Normocephalic and atraumatic.  Eyes: EOM are normal. Pupils are equal, round, and reactive to light. Right eye exhibits chemosis. Right conjunctiva is injected.  Cardiovascular: Normal rate, regular rhythm, normal heart sounds and intact distal pulses.  Exam reveals no friction rub.   No murmur heard. Pulmonary/Chest: Effort normal and breath sounds normal. She has no wheezes. She has no rales.  Abdominal: Soft. Bowel sounds are normal. She exhibits no distension. There is no tenderness. There is no rebound and no guarding.  Musculoskeletal: Normal range of motion. She exhibits tenderness. She exhibits no edema.       No edema. Tenderness over MCP and PIP joints in both hands and wrists. No notable swelling   Neurological: She is alert and oriented to person, place, and time. No cranial nerve deficit.  Skin: Skin is warm and dry. No rash noted.  Psychiatric: She has a normal mood and affect. Her behavior is normal.    ED Course  Procedures (including critical care time)  Labs Reviewed  CBC - Abnormal; Notable for the following:    WBC 12.7 (*)    All other components within normal limits  DIFFERENTIAL - Abnormal; Notable for the following:    Lymphs Abs 5.8 (*)    All other components within normal limits  BASIC METABOLIC PANEL - Abnormal; Notable for the following:    Glucose, Bld 102 (*)    All other components within normal limits  CK - Abnormal; Notable for the following:    Total CK 228 (*)    All other components within normal limits  SEDIMENTATION RATE   No results found.   No diagnosis found.    MDM   Pain shape with joint pain worse in the hands and wrists, mild swelling and evidence of right episcleritis. Concern for possible autoimmune etiology such as lupus or rheumatoid as patient's has not had any infectious symptoms, trauma or other issues.  Sedimentation rate, BMP, CBC within normal limits. The CK only mildly elevated at 228. No recent medication changes or stopping medications. Will try a short course of prednisone and pain control to see if that improves symptoms. Patient is to followup with her doctor on Wednesday for further testing.      Gwyneth Sprout, MD 12/19/11 [phone]

## 2011-12-19 NOTE — ED Notes (Signed)
See triage note.  edp in seeing the pt  Now.  Pt alert no distress

## 2011-12-19 NOTE — ED Notes (Signed)
Pt waiting for a disposition.  familly

## 2011-12-19 NOTE — ED Notes (Signed)
The pts pain and discomfort is not  Much better.

## 2011-12-19 NOTE — ED Notes (Signed)
The pt is playing on her i-pad.  She says she has pain and numbness in both her arms.  Pain med given

## 2011-12-19 NOTE — ED Notes (Signed)
Pt reports having bilateral arm pain x 1 week, has progressed to a numb tingling to bil arms, now reports burning sensation and redness to hands, redness to bil eyes. No neuro deficits noted at triage.

## 2012-02-19 ENCOUNTER — Encounter (HOSPITAL_COMMUNITY): Payer: Self-pay | Admitting: *Deleted

## 2012-02-19 ENCOUNTER — Emergency Department (HOSPITAL_COMMUNITY)

## 2012-02-19 ENCOUNTER — Emergency Department (HOSPITAL_COMMUNITY)
Admission: EM | Admit: 2012-02-19 | Discharge: 2012-02-19 | Disposition: A | Discharge status: Home / Self Care | Attending: Emergency Medicine | Admitting: Emergency Medicine

## 2012-02-19 DIAGNOSIS — F319 Bipolar disorder, unspecified: Secondary | ICD-10-CM | POA: Insufficient documentation

## 2012-02-19 DIAGNOSIS — J45909 Unspecified asthma, uncomplicated: Secondary | ICD-10-CM | POA: Insufficient documentation

## 2012-02-19 DIAGNOSIS — M25449 Effusion, unspecified hand: Secondary | ICD-10-CM | POA: Insufficient documentation

## 2012-02-19 DIAGNOSIS — M2550 Pain in unspecified joint: Secondary | ICD-10-CM

## 2012-02-19 DIAGNOSIS — Z79899 Other long term (current) drug therapy: Secondary | ICD-10-CM | POA: Insufficient documentation

## 2012-02-19 DIAGNOSIS — Z794 Long term (current) use of insulin: Secondary | ICD-10-CM | POA: Insufficient documentation

## 2012-02-19 DIAGNOSIS — M25539 Pain in unspecified wrist: Secondary | ICD-10-CM | POA: Insufficient documentation

## 2012-02-19 DIAGNOSIS — Z7982 Long term (current) use of aspirin: Secondary | ICD-10-CM | POA: Insufficient documentation

## 2012-02-19 DIAGNOSIS — M25519 Pain in unspecified shoulder: Secondary | ICD-10-CM | POA: Insufficient documentation

## 2012-02-19 DIAGNOSIS — M25529 Pain in unspecified elbow: Secondary | ICD-10-CM | POA: Insufficient documentation

## 2012-02-19 DIAGNOSIS — E119 Type 2 diabetes mellitus without complications: Secondary | ICD-10-CM | POA: Insufficient documentation

## 2012-02-19 DIAGNOSIS — M7989 Other specified soft tissue disorders: Secondary | ICD-10-CM | POA: Insufficient documentation

## 2012-02-19 LAB — POCT I-STAT, CHEM 8
BUN: 22 mg/dL (ref 6–23)
Calcium, Ion: 1.26 mmol/L (ref 1.12–1.32)
Creatinine, Ser: 0.5 mg/dL (ref 0.50–1.10)
Glucose, Bld: 279 mg/dL — ABNORMAL HIGH (ref 70–99)
TCO2: 26 mmol/L (ref 0–100)

## 2012-02-19 MED ORDER — HYDROCODONE-ACETAMINOPHEN 5-325 MG PO TABS: 2.0000 | ORAL_TABLET | Freq: Four times a day (QID) | ORAL | Status: AC | PRN

## 2012-02-19 MED ORDER — HYDROCODONE-ACETAMINOPHEN 5-325 MG PO TABS
1.0000 | ORAL_TABLET | Freq: Once | ORAL | Status: AC
  Administered 2012-02-19: 1 via ORAL
  Filled 2012-02-19: qty 1

## 2012-02-19 MED ORDER — NAPROXEN 500 MG PO TABS: 500.0000 mg | ORAL_TABLET | Freq: Two times a day (BID) | ORAL | Status: DC

## 2012-02-19 NOTE — ED Notes (Signed)
PT reports pain started today  When she reached to pick up her Bible . Pt reports she felt something pop in her wrist. Pt now reports pain from the RT shoulder into the elbow and wrist.  Pt reports she can not raise her arm above her head.

## 2012-02-19 NOTE — Discharge Instructions (Signed)
Your lab tests and x-ray today have not shown any signs for concerning or emergent cause of your symptoms. At this time your providers recommend symptomatic treatment with rest, ice, compression and elevation. You have also been given a prescription for medication to use for your pain and inflammation. Please followup with your primary care provider for continued evaluation and treatment of your symptoms.    Arthralgia Your caregiver has diagnosed you as suffering from an arthralgia. Arthralgia means there is pain in a joint. This can come from many reasons including:  Bruising the joint which causes soreness (inflammation) in the joint.   Wear and tear on the joints which occur as we grow older (osteoarthritis).   Overusing the joint.   Various forms of arthritis.   Infections of the joint.  Regardless of the cause of pain in your joint, most of these different pains respond to anti-inflammatory drugs and rest. The exception to this is when a joint is infected, and these cases are treated with antibiotics, if it is a bacterial infection. HOME CARE INSTRUCTIONS   Rest the injured area for as long as directed by your caregiver. Then slowly start using the joint as directed by your caregiver and as the pain allows. Crutches as directed may be useful if the ankles, knees or hips are involved. If the knee was splinted or casted, continue use and care as directed. If an stretchy or elastic wrapping bandage has been applied today, it should be removed and re-applied every 3 to 4 hours. It should not be applied tightly, but firmly enough to keep swelling down. Watch toes and feet for swelling, bluish discoloration, coldness, numbness or excessive pain. If any of these problems (symptoms) occur, remove the ace bandage and re-apply more loosely. If these symptoms persist, contact your caregiver or return to this location.   For the first 24 hours, keep the injured extremity elevated on pillows while  lying down.   Apply ice for 15 to 20 minutes to the sore joint every couple hours while awake for the first half day. Then 3 to 4 times per day for the first 48 hours. Put the ice in a plastic bag and place a towel between the bag of ice and your skin.   Wear any splinting, casting, elastic bandage applications, or slings as instructed.   Only take over-the-counter or prescription medicines for pain, discomfort, or fever as directed by your caregiver. Do not use aspirin immediately after the injury unless instructed by your physician. Aspirin can cause increased bleeding and bruising of the tissues.   If you were given crutches, continue to use them as instructed and do not resume weight bearing on the sore joint until instructed.  Persistent pain and inability to use the sore joint as directed for more than 2 to 3 days are warning signs indicating that you should see a caregiver for a follow-up visit as soon as possible. Initially, a hairline fracture (break in bone) may not be evident on X-rays. Persistent pain and swelling indicate that further evaluation, non-weight bearing or use of the joint (use of crutches or slings as instructed), or further X-rays are indicated. X-rays may sometimes not show a small fracture until a week or 10 days later. Make a follow-up appointment with your own caregiver or one to whom we have referred you. A radiologist (specialist in reading X-rays) may read your X-rays. Make sure you know how you are to obtain your X-ray results. Do not assume everything  is normal if you do not hear from [place]. SEEK MEDICAL CARE IF: Bruising, swelling, or pain increases. SEEK IMMEDIATE MEDICAL CARE IF:   Your fingers or toes are numb or blue.   The pain is not responding to medications and continues to stay the same or get worse.   The pain in your joint becomes severe.   You develop a fever over 102 F (38.9 C).   It becomes impossible to move or use the joint.  MAKE SURE YOU:     Understand these instructions.   Will watch your condition.   Will get help right away if you are not doing well or get worse.  Document Released: 12/05/2005 Document Revised: 11/24/2011 Document Reviewed: 07/23/2008 [facility] Patient Information 2012 [place], [place].    RESOURCE GUIDE  Dental Problems  Patients with Medicaid: [facility] [id number]                                           [id number] Phone:  [phone]                                                  Phone:  [phone]  If unable to pay or uninsured, contact:  [facility] or [facility]. to become qualified for the adult dental clinic.  Chronic Pain Problems Contact [facility]  [phone] Patients need to be referred by their primary care doctor.  Insufficient Money for Medicine Contact [employer]:  call "211" or [facility] 640-590-0489.  No Primary Care Doctor Call [facility]  [phone] Other agencies that provide inexpensive medical care    [facility]  [phone]    [facility]  [phone]    [facility]  [phone]    [facility]  [phone]    [facility]  [phone]    [facility]  [phone]  Psychological Services [facility]  [phone] [facility]  [phone] [facility]   [phone] (emergency services [phone])  Substance Abuse Resources [facility]  ([phone] [facility] 731-321-9019 [facility] [place] ([phone] Floydene Flock [phone] Residential & Outpatient Substance Abuse Program  [phone]  Abuse/Neglect [facility] Child Abuse Hotline 8205915632 [facility] Child Abuse Hotline 6513610505 (After Hours)  Emergency Shelter [facility] [phone]  Maternity Homes [facility] [place] [facility] [phone] [facility] [phone]  MRSA Hotline #:   785-515-8112    [facility] Resources  [facility]     [employer]                          [facility] [address] [place]                       [address]      [address]  [address]  Sela Hua Phone:  [phone]                                   Phone:  [phone]                 Phone:  [place] Phone:  [place] [phone] (After Hours)

## 2012-02-19 NOTE — Progress Notes (Signed)
Orthopedic Tech Progress Note Patient Details:  Kerri Osborn 12-17-1961 [id number]  Other Ortho Devices Type of Ortho Device: Other (comment) (foam arm sling) Ortho Device Location: right arm Ortho Device Interventions: Application   Nikki Dom 02/19/2012, 10:46 PM

## 2012-02-19 NOTE — ED Notes (Signed)
Ortho tech notified of order  For Rt ARM sling

## 2012-02-19 NOTE — ED Provider Notes (Signed)
History     CSN: [id number]  Arrival date & time 02/19/12  2046   First MD Initiated Contact with Patient 02/19/12 2113      Chief Complaint  Patient presents with  . Arm Pain    HPI  History provided by the patient. Patient is a 51 year old African American female history of diabetes who presents with complaints of right upper extremity joint pains and any increasing today. Patient reports having multiple joint pains she was evaluated for the emergency room over one month ago patient states at that time she had pains throughout both knees ankles wrists and upper arms. Today patient states that she was at church and was holding a Bible in her right hand when she felt a pop in the wrist area causing increasing pains to her wrist and right elbow. Pain also radiates up to the shoulder area. Pain is a throbbing constant ache described as moderate to severe. Patient did not take any medications for her symptoms. Symptoms are made worse by movements and improved with rest. Patient also reports having some initial swelling to her hand and finger joints but states that this has improved with rest. She reports slight numbness to her fingertips on all fingers. Patient denies any other aggravating or alleviating factors. Patient denies any history of gout or rheumatoid arthritis.    Past Medical History  Diagnosis Date  . Diabetes mellitus   . Bipolar disorder   . Asthma     Past Surgical History  Procedure Date  . Abdominal hysterectomy   . Tubal ligation   . Appendectomy   . Myomectomy   . Cesarean section   . Breast cyst excision   . Inner ear surgery     tubes in ears    Family History  Problem Relation Age of Onset  . Allergies Sister   . Allergies Daughter   . Allergies Daughter   . Asthma Daughter   . Asthma Daughter     History  Substance Use Topics  . Smoking status: Never Smoker   . Smokeless tobacco: Not on file  . Alcohol Use: No    OB History    Grav Para Term  Preterm Abortions TAB SAB Ect Mult Living                  Review of Systems  Constitutional: Negative for fever and chills.  Musculoskeletal: Positive for joint swelling and arthralgias. Negative for back pain.  Skin: Negative for rash.  All other systems reviewed and are negative.    Allergies  Amoxicillin  Home Medications   Current Outpatient Rx  Name Route Sig Dispense Refill  . ALBUTEROL SULFATE HFA 108 (90 BASE) MCG/ACT IN AERS Inhalation Inhale 2 puffs into the lungs 2 (two) times daily as needed. For shortness of breath    . ASPIRIN 81 MG PO TABS Oral Take 81 mg by mouth daily.      [name] Kitchen ESTRADIOL 0.5 MG/0.5GM TD GEL Topical Apply 1 application topically daily.     . INSULIN DETEMIR 100 UNIT/ML [place] SOLN Subcutaneous Inject 12-22 Units into the skin at bedtime. Sliding scale    . LAMOTRIGINE 100 MG PO TABS Oral Take 100 mg by mouth daily.      [name] Kitchen NAPROXEN SODIUM 220 MG PO CAPS Oral Take 1 capsule by mouth daily as needed. For pain    . PIOGLITAZONE HCL 15 MG PO TABS Oral Take 15 mg by mouth daily.      [name] Kitchen  SERTRALINE HCL 25 MG PO TABS Oral Take 25 mg by mouth daily.      [name] Kitchen SITAGLIPTIN-METFORMIN HCL 50-1000 MG PO TABS Oral Take 1 tablet by mouth 2 (two) times daily with a meal.        BP 134/67  Pulse 63  Temp(Src) 97.7 F (36.5 C) (Oral)  Resp 18  SpO2 97%  Physical Exam  Nursing note and vitals reviewed. Constitutional: She is oriented to person, place, and time. She appears well-developed and well-nourished. No distress.  HENT:  Head: Normocephalic and atraumatic.  Neck: Normal range of motion. Neck supple.       No cervical midline tenderness  Cardiovascular: Normal rate and regular rhythm.   Pulmonary/Chest: Effort normal and breath sounds normal. No respiratory distress. She has no wheezes.  Abdominal: Soft.  Musculoskeletal: Normal range of motion. She exhibits no edema.       Tenderness to palpation over dorsal right wrist on the ulnar side. No deformity or  swelling. Normal range of motion. Normal distal sensations to light touch with normal cap refill less than 2 seconds. No deformity of elbow, normal range of motion, mild tenderness to palpation over medial epicondyle.  Neurological: She is alert and oriented to person, place, and time.  Skin: Skin is warm and dry. No rash noted.  Psychiatric: She has a normal mood and affect. Her behavior is normal.    ED Course  Procedures  Results for orders placed during the hospital encounter of 02/19/12  SEDIMENTATION RATE      Component Value Range   Sed Rate 15  0 - 22 (mm/hr)  URIC ACID      Component Value Range   Uric Acid, Serum 3.7  2.4 - 7.0 (mg/dL)  CK      Component Value Range   Total CK 180 (*) 7 - 177 (U/L)  POCT I-STAT, CHEM 8      Component Value Range   Sodium 140  135 - 145 (mEq/L)   Potassium 4.1  3.5 - 5.1 (mEq/L)   Chloride 105  96 - 112 (mEq/L)   BUN 22  6 - 23 (mg/dL)   Creatinine, Ser 1.47  0.50 - 1.10 (mg/dL)   Glucose, Bld 829 (*) 70 - 99 (mg/dL)   Calcium, Ion 5.62  1.30 - 1.32 (mmol/L)   TCO2 26  0 - 100 (mmol/L)   Hemoglobin 13.6  12.0 - 15.0 (g/dL)   HCT 86.5  78.4 - 69.6 (%)      Dg Wrist Complete Right  02/19/2012  *RADIOLOGY REPORT*  Clinical Data: Heard a pop in wrist after picking up a book, swelling, numbness, pain  RIGHT WRIST - COMPLETE 3+ VIEW  Comparison: None  Findings: Bone mineralization normal. Joint spaces preserved. No fracture, dislocation, or bone destruction.  IMPRESSION: No acute osseous abnormalities.  Original Report Authenticated By: Lollie Marrow, M.D.     1. Arthralgia       MDM  9:35 PM patient seen and evaluated. Patient in no acute distress.  Patient discussed with attending physician. Will add on i-STAT and CK. He agrees with workup and treatment plan.  Labs with hyperglycemia and slightly elevated CK but no other significant findings. Patient having improvement of symptoms after Vicodin. At this time will have patient  return home and followup with PCP. She was advised of elevated blood sugar. She will return home and use her medications.    Angus Seller, PA 02/20/12 0157

## 2012-02-20 NOTE — ED Provider Notes (Signed)
Medical screening examination/treatment/procedure(s) were performed by non-physician practitioner and as supervising physician I was immediately available for consultation/collaboration.   Glynn Octave, MD 02/20/12 [phone]

## 2012-03-25 ENCOUNTER — Encounter (HOSPITAL_COMMUNITY): Payer: Self-pay | Admitting: *Deleted

## 2012-03-25 ENCOUNTER — Emergency Department (HOSPITAL_COMMUNITY)
Admission: EM | Admit: 2012-03-25 | Discharge: 2012-03-25 | Disposition: A | Discharge status: Home / Self Care | Attending: Emergency Medicine | Admitting: Emergency Medicine

## 2012-03-25 DIAGNOSIS — M79609 Pain in unspecified limb: Secondary | ICD-10-CM | POA: Insufficient documentation

## 2012-03-25 DIAGNOSIS — I2510 Atherosclerotic heart disease of native coronary artery without angina pectoris: Secondary | ICD-10-CM | POA: Insufficient documentation

## 2012-03-25 DIAGNOSIS — J45909 Unspecified asthma, uncomplicated: Secondary | ICD-10-CM | POA: Insufficient documentation

## 2012-03-25 DIAGNOSIS — M6281 Muscle weakness (generalized): Secondary | ICD-10-CM | POA: Insufficient documentation

## 2012-03-25 DIAGNOSIS — M069 Rheumatoid arthritis, unspecified: Secondary | ICD-10-CM | POA: Insufficient documentation

## 2012-03-25 DIAGNOSIS — R29898 Other symptoms and signs involving the musculoskeletal system: Secondary | ICD-10-CM

## 2012-03-25 DIAGNOSIS — E119 Type 2 diabetes mellitus without complications: Secondary | ICD-10-CM | POA: Insufficient documentation

## 2012-03-25 DIAGNOSIS — F319 Bipolar disorder, unspecified: Secondary | ICD-10-CM | POA: Insufficient documentation

## 2012-03-25 HISTORY — DX: Atherosclerotic heart disease of native coronary artery without angina pectoris: I25.10

## 2012-03-25 MED ORDER — OXYCODONE-ACETAMINOPHEN 5-325 MG PO TABS
1.0000 | ORAL_TABLET | Freq: Once | ORAL | Status: AC
  Administered 2012-03-25: 1 via ORAL
  Filled 2012-03-25: qty 1

## 2012-03-25 MED ORDER — OXYCODONE-ACETAMINOPHEN 5-325 MG PO TABS: 1.0000 | ORAL_TABLET | ORAL | Status: AC | PRN

## 2012-03-25 NOTE — Discharge Instructions (Signed)
I believe you may possibly have a pinched nerve in your neck versus a problem with the nerves or joints in your forearm, wrist or hand.  The radiology Department will call you to schedule your MRI of your neck.  I believe you should also followup with the hand specialist Dr. Izora Ribas.  If you're weakness or numbness gets worse please seek reevaluation by your primary care physician or here in the emergency department.  Peripheral Nerve Problems Peripheral nerve disorders are problems with the nerves in your arms or legs. CAUSES  There are many different causes of these disorders. They include:  Injury.   Diabetes.   Chronic alcoholism.   Toxic chemicals and drugs.   Vitamin deficiencies.   Tumors.   Liver or kidney diseases.  SYMPTOMS  Some of the problems caused are:  Tingling, burning, pain, and numbness in the extremities and feet.   Weakness, loss of muscle tone, and size.  DIAGNOSIS  Sometimes blood tests and studies to examine nerve function are needed to make the diagnosis.  TREATMENT  Sometimes peripheral nerve problems can be treated with vitamins, medication, and avoiding known toxins such as alcohol. Please make a follow-up appointment to be sure you are getting better with treatment.  SEEK MEDICAL CARE IF:   You are not better after one week of treatment.   You have worsening of problems or have breathing trouble.  Document Released: 01/12/2005 Document Revised: 11/24/2011 Document Reviewed: 12/05/2005 [facility] Patient Information 2012 [place], [place].

## 2012-03-25 NOTE — ED Notes (Signed)
Pt is here with right arm pain that affects mid right forearm down to fingers.  Pt has pain with bending wrist

## 2012-03-25 NOTE — ED Provider Notes (Signed)
History     CSN: [id number]  Arrival date & time 03/25/12  1407   First MD Initiated Contact with Patient 03/25/12 1551      Chief Complaint  Patient presents with  . Arm Pain    (Consider location/radiation/quality/duration/timing/severity/associated sxs/prior treatment) HPI Comments: Patient with several days of right forearm wrist and hand pain.  Patient notes she's been getting worked up for joint pain by her primary care physician is and has been told she has rheumatoid arthritis.  Over the last 2 days patient notes that she has increasing ulnar wrist swelling on the right hand with swelling of her right hand.  No known injury or trauma.  Patient notes some weakness in that hand today as she dropped a pan she was trying to hold him drop the cup earlier this morning.  She notes pain associated with this that is worse with palpation over certain aspects of her forearm and with flexion and extension of her wrist.  No neck pain shoulder pain or upper arm pain.  No trauma to her elbow that she is aware of.  No other weakness or numbness  Patient is a 51 y.o. female presenting with arm pain. The history is provided by the patient. No language interpreter was used.  Arm Pain This is a new problem. The problem occurs constantly. The problem has been gradually worsening. Pertinent negatives include no chest pain, no abdominal pain, no headaches and no shortness of breath. The symptoms are aggravated by bending. The symptoms are relieved by nothing. She has tried nothing for the symptoms.    Past Medical History  Diagnosis Date  . Diabetes mellitus   . Bipolar disorder   . Asthma   . Coronary artery disease     Past Surgical History  Procedure Date  . Abdominal hysterectomy   . Tubal ligation   . Appendectomy   . Myomectomy   . Cesarean section   . Breast cyst excision   . Inner ear surgery     tubes in ears    Family History  Problem Relation Age of Onset  . Allergies Sister     . Allergies Daughter   . Allergies Daughter   . Asthma Daughter   . Asthma Daughter     History  Substance Use Topics  . Smoking status: Never Smoker   . Smokeless tobacco: Not on file  . Alcohol Use: No    OB History    Grav Para Term Preterm Abortions TAB SAB Ect Mult Living                  Review of Systems  Constitutional: Negative for fever and chills.  HENT: Negative.   Eyes: Negative.  Negative for discharge and redness.  Respiratory: Negative.  Negative for cough and shortness of breath.   Cardiovascular: Negative.  Negative for chest pain.  Gastrointestinal: Negative.  Negative for nausea, vomiting, abdominal pain and diarrhea.  Genitourinary: Negative.  Negative for dysuria and vaginal discharge.  Musculoskeletal: Positive for joint swelling. Negative for back pain.  Skin: Negative.  Negative for color change and rash.  Neurological: Positive for weakness. Negative for syncope and headaches.  Hematological: Negative.  Negative for adenopathy.  Psychiatric/Behavioral: Negative.  Negative for confusion.  All other systems reviewed and are negative.    Allergies  Amoxicillin  Home Medications   Current Outpatient Rx  Name Route Sig Dispense Refill  . ALBUTEROL SULFATE HFA 108 (90 BASE) MCG/ACT IN AERS Inhalation Inhale  2 puffs into the lungs 2 (two) times daily as needed. For shortness of breath    . ASPIRIN 81 MG PO CHEW Oral Chew 81 mg by mouth daily.    [name] Kitchen ESTRADIOL 0.5 MG/0.5GM TD GEL Topical Apply 1 application topically daily.     . INSULIN DETEMIR 100 UNIT/ML [place] SOLN Subcutaneous Inject 25 Units into the skin at bedtime. Sliding scale    . LAMOTRIGINE 100 MG PO TABS Oral Take 100 mg by mouth daily.      [name] Kitchen PIOGLITAZONE HCL 15 MG PO TABS Oral Take 15 mg by mouth daily.      . SERTRALINE HCL 25 MG PO TABS Oral Take 25 mg by mouth daily.      [name] Kitchen SITAGLIPTIN-METFORMIN HCL 50-1000 MG PO TABS Oral Take 1 tablet by mouth 2 (two) times daily with a meal.         BP 123/66  Pulse 93  Temp(Src) 98.4 F (36.9 C) (Oral)  Resp 20  SpO2 94%  Physical Exam  Nursing note and vitals reviewed. Constitutional: She is oriented to person, place, and time. She appears well-developed and well-nourished.  Non-toxic appearance. She does not have a sickly appearance.  HENT:  Head: Normocephalic and atraumatic.  Eyes: Conjunctivae, EOM and lids are normal. Pupils are equal, round, and reactive to light. No scleral icterus.  Neck: Trachea normal and normal range of motion. Neck supple.  Cardiovascular: Normal rate.   Pulmonary/Chest: Effort normal.  Abdominal: Normal appearance. There is no CVA tenderness.  Musculoskeletal: She exhibits edema and tenderness.       Patient has no C-spine tenderness.  No shoulder tenderness or upper arm or elbow tenderness.  Patient does have a noted area of swelling over the ulnar aspect of her wrist.  There is no erythema, warmth or crepitus there.  There is a palpable radial pulse.  Capillary refill is less than 2 seconds in her fingertips.  Patient does have diffuse swelling of her hands without redness or warmth.  She notes some subjectively decreased sensation on the ulnar aspect of her hand compared with the median nerve and radial nerve distributions.  Patient can oppose her thumb to all of her digits.  Her grip strength is not as strong as the left hand.  Her biceps strength is normal.  She has a 2+ biceps reflex on exam.  Patient does have some increased pain in her hand with flexion of the wrist  Neurological: She is alert and oriented to person, place, and time. She has normal strength.  Skin: Skin is warm, dry and intact. No rash noted.  Psychiatric: She has a normal mood and affect. Her behavior is normal. Judgment and thought content normal.    ED Course  Procedures (including critical care time)  Labs Reviewed - No data to display No results found.   No diagnosis found.    MDM  Patient with a possible  peripheral nerve problem versus possible rheumatoid arthritis.  Patient's symptoms are not consistent with a central cause for stroke is there localized to her hand and wrist and there is pain associated with her symptoms.  Patient shows no signs of infection as she has no redness, fevers or crepitus on exam.  Patient has no known injury or trauma.  Patient doesn't hand and wrist swelling which may be related to rheumatoid arthritis process.  I'm going to order an outpatient C-spine MRI to further evaluate the patient's peripheral nerve symptoms and rule out cervical radiculopathy.  I'm going to advise the patient to followup with a hand specialist we'll give her that information.  I will also give her information for a neurologist in case she needs to see a neurologist after the hand specialist.  Patient also is a primary care physician she can use to help coordinate her further care        Nat Christen, MD 03/25/12 1624

## 2012-03-25 NOTE — Progress Notes (Signed)
Orthopedic Tech Progress Note Patient Details:  Kerri Osborn 1961/10/01 [id number]  Type of Splint: Other (comment) (velcro wrist splint) Splint Location: (R) UE Splint Interventions: Application    Jennye Moccasin 03/25/2012, 4:32 PM

## 2012-03-25 NOTE — Progress Notes (Signed)
Orthopedic Tech Progress Note Patient Details:  Kerri Osborn 16-Jul-1961 [id number]  Patient ID: [name], female   DOB: 11-11-1961, 51 y.o.   MRN: [id number]   [name] 03/25/2012, 4:39 PManthony

## 2012-08-22 ENCOUNTER — Emergency Department (HOSPITAL_COMMUNITY)
Admission: EM | Admit: 2012-08-22 | Discharge: 2012-08-22 | Disposition: A | Discharge status: Home / Self Care | Attending: Emergency Medicine | Admitting: Emergency Medicine

## 2012-08-22 ENCOUNTER — Emergency Department (HOSPITAL_COMMUNITY)

## 2012-08-22 ENCOUNTER — Encounter (HOSPITAL_COMMUNITY): Payer: Self-pay

## 2012-08-22 DIAGNOSIS — Z79899 Other long term (current) drug therapy: Secondary | ICD-10-CM | POA: Insufficient documentation

## 2012-08-22 DIAGNOSIS — R071 Chest pain on breathing: Secondary | ICD-10-CM | POA: Insufficient documentation

## 2012-08-22 DIAGNOSIS — F319 Bipolar disorder, unspecified: Secondary | ICD-10-CM | POA: Insufficient documentation

## 2012-08-22 DIAGNOSIS — J45909 Unspecified asthma, uncomplicated: Secondary | ICD-10-CM | POA: Insufficient documentation

## 2012-08-22 DIAGNOSIS — E119 Type 2 diabetes mellitus without complications: Secondary | ICD-10-CM | POA: Insufficient documentation

## 2012-08-22 DIAGNOSIS — Z9089 Acquired absence of other organs: Secondary | ICD-10-CM | POA: Insufficient documentation

## 2012-08-22 DIAGNOSIS — I2510 Atherosclerotic heart disease of native coronary artery without angina pectoris: Secondary | ICD-10-CM | POA: Insufficient documentation

## 2012-08-22 DIAGNOSIS — R0789 Other chest pain: Secondary | ICD-10-CM

## 2012-08-22 LAB — CBC WITH DIFFERENTIAL/PLATELET
Basophils Absolute: 0 10*3/uL (ref 0.0–0.1)
Eosinophils Absolute: 0.2 10*3/uL (ref 0.0–0.7)
Eosinophils Relative: 2 % (ref 0–5)
MCH: 27.4 pg (ref 26.0–34.0)
MCV: 82.2 fL (ref 78.0–100.0)
Monocytes Absolute: 0.6 10*3/uL (ref 0.1–1.0)
Platelets: 206 10*3/uL (ref 150–400)
RDW: 13.4 % (ref 11.5–15.5)

## 2012-08-22 LAB — POCT I-STAT, CHEM 8
BUN: 15 mg/dL (ref 6–23)
Creatinine, Ser: 0.7 mg/dL (ref 0.50–1.10)
Hemoglobin: 14.3 g/dL (ref 12.0–15.0)
Potassium: 4.2 mEq/L (ref 3.5–5.1)
Sodium: 139 mEq/L (ref 135–145)

## 2012-08-22 MED ORDER — KETOROLAC TROMETHAMINE 30 MG/ML IJ SOLN
30.0000 mg | Freq: Once | INTRAMUSCULAR | Status: AC
  Administered 2012-08-22: 30 mg via INTRAMUSCULAR

## 2012-08-22 MED ORDER — ASPIRIN 81 MG PO CHEW
324.0000 mg | CHEWABLE_TABLET | Freq: Once | ORAL | Status: AC
  Administered 2012-08-22: 324 mg via ORAL
  Filled 2012-08-22 (×2): qty 4

## 2012-08-22 MED ORDER — GI COCKTAIL ~~LOC~~
30.0000 mL | Freq: Once | ORAL | Status: AC
  Administered 2012-08-22: 30 mL via ORAL
  Filled 2012-08-22: qty 30

## 2012-08-22 MED ORDER — KETOROLAC TROMETHAMINE 30 MG/ML IJ SOLN
30.0000 mg | Freq: Once | INTRAMUSCULAR | Status: DC
  Filled 2012-08-22: qty 1

## 2012-08-22 NOTE — ED Notes (Signed)
Patient upset with long wait times, apologized to patient, gave food and drink, Heather PA saw patient and will be discharging soon.

## 2012-08-22 NOTE — ED Notes (Signed)
Chart check

## 2012-08-22 NOTE — ED Provider Notes (Signed)
History     CSN: [id number]  Arrival date & time 08/22/12  1425   First MD Initiated Contact with Patient 08/22/12 2036      Chief Complaint  Patient presents with  . Chest Pain    (Consider location/radiation/quality/duration/timing/severity/associated sxs/prior treatment) HPI Comments: Patient with a history of DM presents today with a chief complaint of chest pain.  Chest pain has been constant since 1 PM.  Pain is located in the substernal area and does not radiate.  Nothing makes the pain better or worse. She has had similar pain in the past, but states that pain today is not as severe as pain she has had in the past.   Pain not associated with numbness, tingling, shortness of breath, dizziness, lightheadedness, or syncope.  She does not smoke.  No history of HTN or Hyperlipidemia.  No prior cardiac history.  No prior history of DVT or PE.  She is not on any estrogen containing medications.  She denies LE edema or pain.  No prolonged travel or surgery in the past 4 weeks.  No history of cancer.  She states that she has never had a stress test or cardiac cath.  She does not have a Development worker, international aid.  Her PCP is Velna Hatchet.  The history is provided by the patient.    Past Medical History  Diagnosis Date  . Diabetes mellitus   . Bipolar disorder   . Asthma   . Coronary artery disease     Past Surgical History  Procedure Date  . Abdominal hysterectomy   . Tubal ligation   . Appendectomy   . Myomectomy   . Cesarean section   . Breast cyst excision   . Inner ear surgery     tubes in ears    Family History  Problem Relation Age of Onset  . Allergies Sister   . Allergies Daughter   . Allergies Daughter   . Asthma Daughter   . Asthma Daughter     History  Substance Use Topics  . Smoking status: Never Smoker   . Smokeless tobacco: Not on file  . Alcohol Use: No    OB History    Grav Para Term Preterm Abortions TAB SAB Ect Mult Living                  Review of  Systems  Constitutional: Negative for fever and chills.  Respiratory: Positive for cough. Negative for shortness of breath and wheezing.   Cardiovascular: Positive for chest pain. Negative for palpitations and leg swelling.  Gastrointestinal: Negative for nausea, vomiting and abdominal pain.  Skin: Negative for rash.  Neurological: Negative for dizziness, syncope and light-headedness.    Allergies  Amoxicillin  Home Medications   Current Outpatient Rx  Name Route Sig Dispense Refill  . ALBUTEROL SULFATE HFA 108 (90 BASE) MCG/ACT IN AERS Inhalation Inhale 2 puffs into the lungs 2 (two) times daily as needed. For shortness of breath    . ASPIRIN 81 MG PO CHEW Oral Chew 81 mg by mouth daily.    [name] Kitchen ESTRADIOL 0.5 MG/0.5GM TD GEL Topical Apply 1 application topically daily.     . INSULIN DETEMIR 100 UNIT/ML [place] SOLN Subcutaneous Inject 25 Units into the skin at bedtime. Sliding scale    . LAMOTRIGINE 100 MG PO TABS Oral Take 100 mg by mouth daily.      [name] Kitchen PIOGLITAZONE HCL 15 MG PO TABS Oral Take 15 mg by mouth daily.      [name] Kitchen  SERTRALINE HCL 25 MG PO TABS Oral Take 25 mg by mouth daily.      [name] Kitchen SITAGLIPTIN-METFORMIN HCL 50-1000 MG PO TABS Oral Take 1 tablet by mouth 2 (two) times daily with a meal.        BP 133/72  Pulse 78  Temp 97.7 F (36.5 C) (Oral)  Resp 15  SpO2 100%  Physical Exam  Nursing note and vitals reviewed. Constitutional: She appears well-developed and well-nourished. No distress.  HENT:  Head: Normocephalic and atraumatic.  Mouth/Throat: Oropharynx is clear and moist.  Neck: Normal range of motion. Neck supple.  Cardiovascular: Normal rate, regular rhythm, normal heart sounds and intact distal pulses.   No murmur heard. Pulmonary/Chest: Effort normal and breath sounds normal. No respiratory distress. She has no wheezes. She has no rales. She exhibits tenderness.  Abdominal: Soft. There is no tenderness.  Musculoskeletal: Normal range of motion.  Neurological: She is  alert.  Skin: Skin is warm and dry. She is not diaphoretic.  Psychiatric: Her affect is angry. She is agitated.    ED Course  Procedures (including critical care time)  Labs Reviewed  CBC WITH DIFFERENTIAL - Abnormal; Notable for the following:    WBC 10.8 (*)     Lymphocytes Relative 48 (*)     Lymphs Abs 5.2 (*)     All other components within normal limits  POCT I-STAT, CHEM 8 - Abnormal; Notable for the following:    Glucose, Bld 225 (*)     All other components within normal limits  POCT I-STAT TROPONIN I   Dg Chest 2 View  08/22/2012  *RADIOLOGY REPORT*  Clinical Data: Chest pain, diabetes, history of asthma  CHEST - 2 VIEW  Comparison: Chest x-ray of 03/20/2011 and CT angio chest of the same date  Findings: The lungs are clear.  Mediastinal contours are stable. The heart is within normal limits in size.  No bony abnormality is seen.  IMPRESSION: No active lung disease.   Original Report Authenticated By: Juline Patch, M.D.      Date: 08/23/2012  Rate: 72  Rhythm: normal sinus rhythm  QRS Axis: normal  Intervals: normal  ST/T Wave abnormalities: normal  Conduction Disutrbances:none  Narrative Interpretation:   Old EKG Reviewed: unchanged   No diagnosis found.  Patient is requesting to be discharged.  She states that she is sick of waiting and does not want to wait any longer.  She was refusing any interventions until she can get food.    MDM  Patient is to be discharged with recommendation to follow up with PCP in regards to today's hospital visit. Chest wall tender to palpation.  Chest pain is not likely of cardiac or pulmonary etiology d/t presentation, perc negative, VSS, no tracheal deviation, no JVD or new murmur, RRR, breath sounds equal bilaterally, EKG without acute abnormalities, negative troponin, and negative CXR. Pt has been advised start a PPI and return to the ED is CP becomes exertional, associated with diaphoresis or nausea, radiates to left jaw/arm,  worsens or becomes concerning in any way. Pt appears reliable for follow up and is agreeable to discharge. She does have a PCP.  Case has been discussed with Dr. Rulon Abide who agrees with the above plan to discharge.         Pascal Lux [place], PA-C 08/23/12 1112  Pascal Lux [place], PA-C 08/23/12 1129

## 2012-08-22 NOTE — ED Notes (Signed)
Chest pain that started during class, sts dropped her to her knees, light headed, pressure in chest, problems breathing, and thirsty.

## 2012-08-23 NOTE — ED Provider Notes (Signed)
Medical screening examination/treatment/procedure(s) were performed by non-physician practitioner and as supervising physician I was immediately available for consultation/collaboration.  Jones Skene, M.D.     Jones Skene, MD 08/23/12 1310

## 2012-09-03 ENCOUNTER — Encounter (HOSPITAL_COMMUNITY): Payer: Self-pay

## 2012-09-03 ENCOUNTER — Emergency Department (HOSPITAL_COMMUNITY)
Admission: EM | Admit: 2012-09-03 | Discharge: 2012-09-03 | Disposition: A | Discharge status: Home / Self Care | Attending: Emergency Medicine | Admitting: Emergency Medicine

## 2012-09-03 DIAGNOSIS — I2510 Atherosclerotic heart disease of native coronary artery without angina pectoris: Secondary | ICD-10-CM | POA: Insufficient documentation

## 2012-09-03 DIAGNOSIS — E119 Type 2 diabetes mellitus without complications: Secondary | ICD-10-CM | POA: Insufficient documentation

## 2012-09-03 DIAGNOSIS — Z794 Long term (current) use of insulin: Secondary | ICD-10-CM | POA: Insufficient documentation

## 2012-09-03 DIAGNOSIS — J039 Acute tonsillitis, unspecified: Secondary | ICD-10-CM

## 2012-09-03 DIAGNOSIS — M129 Arthropathy, unspecified: Secondary | ICD-10-CM | POA: Insufficient documentation

## 2012-09-03 DIAGNOSIS — J45909 Unspecified asthma, uncomplicated: Secondary | ICD-10-CM | POA: Insufficient documentation

## 2012-09-03 DIAGNOSIS — J029 Acute pharyngitis, unspecified: Secondary | ICD-10-CM

## 2012-09-03 DIAGNOSIS — F319 Bipolar disorder, unspecified: Secondary | ICD-10-CM | POA: Insufficient documentation

## 2012-09-03 DIAGNOSIS — Z79899 Other long term (current) drug therapy: Secondary | ICD-10-CM | POA: Insufficient documentation

## 2012-09-03 HISTORY — DX: Unspecified osteoarthritis, unspecified site: M19.90

## 2012-09-03 LAB — RAPID STREP SCREEN (MED CTR MEBANE ONLY): Streptococcus, Group A Screen (Direct): NEGATIVE

## 2012-09-03 MED ORDER — CEPHALEXIN 500 MG PO CAPS: 500.0000 mg | ORAL_CAPSULE | Freq: Four times a day (QID) | ORAL | Status: DC

## 2012-09-03 MED ORDER — HYDROCODONE-ACETAMINOPHEN 5-325 MG PO TABS: 2.0000 | ORAL_TABLET | ORAL | Status: DC | PRN

## 2012-09-03 NOTE — ED Provider Notes (Signed)
Kerri Osborn is a 51 y.o. female who has had a sore throat for several days with a concretion on the right tonsil. She has malaise, and achiness is no trismus. On exam, she has peritonsillar erythema, and right tonsillar hypertrophy with a large concretion of the upper aspect of her right tonsil. Neck is supple.  Medical screening examination/treatment/procedure(s) were conducted as a shared visit with non-physician practitioner(s) and myself.  I personally evaluated the patient during the encounter  Flint Melter, MD 09/03/12 1704

## 2012-09-03 NOTE — ED Notes (Signed)
Patient states that she began having a sore throat 3 days ago and the pain has gotten progressively worse. Patient states swallowing is difficulty due to pain. Ptioent denies any problems breathing. Patient states she has taken Motrin and gargling with salt water with no relief.

## 2012-09-03 NOTE — ED Provider Notes (Signed)
History     CSN: [id number]  Arrival date & time 09/03/12  1012   First MD Initiated Contact with Patient 09/03/12 1224      Chief Complaint  Patient presents with  . Sore Throat    (Consider location/radiation/quality/duration/timing/severity/associated sxs/prior treatment) The history is provided by the patient and medical records.   Kerri Osborn is a 51 y.o. female presents to the emergency department complaining of sore throat.  The onset of the symptoms was  gradual starting 3 days ago.  The patient has associated painful swollowing and white nodule.  The symptoms have been  persistent, gradually worsened.  nothing makes the symptoms worse and nothing makes symptoms better.  The patient denies , chills, headache, neck pain, chest pain, breath, nausea, vomiting, diarrhea.  Pt has tried gargling with arm salt water, cold things neither of which helped.  Pt's daughter was sick at home with a cough, but no other sick contacts.  Pt does not work in a daycare.  Patient states she can see white spots on her tonsils. She scraped off it hurts and then came back.  She is concerned about strep throat  Past Medical History  Diagnosis Date  . Diabetes mellitus   . Bipolar disorder   . Asthma   . Coronary artery disease   . Arthritis     Past Surgical History  Procedure Date  . Abdominal hysterectomy   . Tubal ligation   . Appendectomy   . Myomectomy   . Cesarean section   . Breast cyst excision   . Inner ear surgery     tubes in ears    Family History  Problem Relation Age of Onset  . Allergies Sister   . Allergies Daughter   . Allergies Daughter   . Asthma Daughter   . Asthma Daughter     History  Substance Use Topics  . Smoking status: Never Smoker   . Smokeless tobacco: Never Used  . Alcohol Use: No    OB History    Grav Para Term Preterm Abortions TAB SAB Ect Mult Living                  Review of Systems  Constitutional: Negative for fever, chills and  fatigue.  HENT: Positive for sore throat. Negative for ear pain, congestion, facial swelling, rhinorrhea, drooling, mouth sores, trouble swallowing, neck pain, neck stiffness, dental problem, voice change and postnasal drip.   Eyes: Negative for pain.  Respiratory: Negative for cough, chest tightness and shortness of breath.   Cardiovascular: Negative for chest pain.  Gastrointestinal: Negative for nausea, vomiting and abdominal pain.  Musculoskeletal: Positive for myalgias.  Skin: Negative for rash.  Neurological: Negative for facial asymmetry and headaches.  Hematological: Negative for adenopathy.  Psychiatric/Behavioral: The patient is not nervous/anxious.     Allergies  Amoxicillin  Home Medications   Current Outpatient Rx  Name Route Sig Dispense Refill  . ALBUTEROL SULFATE HFA 108 (90 BASE) MCG/ACT IN AERS Inhalation Inhale 2 puffs into the lungs 2 (two) times daily as needed. For shortness of breath    . ALBUTEROL SULFATE (2.5 MG/3ML) 0.083% IN NEBU Nebulization Take 2.5 mg by nebulization every 6 (six) hours as needed. For shortness of breath or wheezing    . ASPIRIN EC 81 MG PO TBEC Oral Take 81 mg by mouth daily.    . INSULIN DETEMIR 100 UNIT/ML [place] SOLN Subcutaneous Inject 26 Units into the skin at bedtime. Sliding scale    .  LAMOTRIGINE 100 MG PO TABS Oral Take 100 mg by mouth daily.     . SERTRALINE HCL 25 MG PO TABS Oral Take 25 mg by mouth daily.     [name] Kitchen SITAGLIPTIN-METFORMIN HCL 50-1000 MG PO TABS Oral Take 1 tablet by mouth 2 (two) times daily with a meal.     . CEPHALEXIN 500 MG PO CAPS Oral Take 1 capsule (500 mg total) by mouth 4 (four) times daily. 40 capsule 0  . HYDROCODONE-ACETAMINOPHEN 5-325 MG PO TABS Oral Take 2 tablets by mouth every 4 (four) hours as needed for pain. 6 tablet 0    BP 144/76  Pulse 71  Temp 98.4 F (36.9 C) (Oral)  Resp 16  SpO2 99%  Physical Exam  Nursing note and vitals reviewed. Constitutional: She appears well-developed and  well-nourished. No distress.  HENT:  Head: Normocephalic and atraumatic.  Right Ear: Tympanic membrane, external ear and ear canal normal.  Left Ear: Tympanic membrane, external ear and ear canal normal.  Nose: Nose normal.  Mouth/Throat: Uvula is midline and mucous membranes are normal. No uvula swelling or lacerations. Posterior oropharyngeal erythema present. No oropharyngeal exudate, posterior oropharyngeal edema or tonsillar abscesses.    Eyes: Conjunctivae normal are normal. No scleral icterus.  Neck: Normal range of motion. Neck supple.  Cardiovascular: Normal rate, regular rhythm, normal heart sounds and intact distal pulses.  Exam reveals no gallop and no friction rub.   No murmur heard. Pulmonary/Chest: Effort normal and breath sounds normal. No respiratory distress. She has no wheezes.  Musculoskeletal: Normal range of motion. She exhibits no edema.  Neurological: She is alert.       Speech is clear and goal oriented Moves extremities without ataxia  Skin: Skin is warm and dry. She is not diaphoretic.  Psychiatric: She has a normal mood and affect.    ED Course  Procedures (including critical care time)   Labs Reviewed  RAPID STREP SCREEN   No results found.  Results for orders placed during the hospital encounter of 09/03/12  RAPID STREP SCREEN      Component Value Range   Streptococcus, Group A Screen (Direct) NEGATIVE  NEGATIVE   Dg Chest 2 View  08/22/2012  *RADIOLOGY REPORT*  Clinical Data: Chest pain, diabetes, history of asthma  CHEST - 2 VIEW  Comparison: Chest x-ray of 03/20/2011 and CT angio chest of the same date  Findings: The lungs are clear.  Mediastinal contours are stable. The heart is within normal limits in size.  No bony abnormality is seen.  IMPRESSION: No active lung disease.   Original Report Authenticated By: Juline Patch, M.D.    1. Pharyngitis   2. Tonsillitis       MDM  [name] resents with sore throat. Concern for strep versus  viral tonsillitis.  Strep test negative. Patient with exudate on the right tonsil. Mild tonsillar lymphadenopathy and tenderness.   Pt febrile with tonsillar exudate, cervical lymphadenopathy, & dysphagia; diagnosis of strep. Presentation non concerning for PTA or infxn spread to soft tissue. No trismus or uvula deviation. Specific return precautions discussed. Pt able to drink water in ED without difficulty with intact air way. Recommended PCP follow up. Encouraged gargling with warm salt water. Will give pain medication will treat with cephalexin as patient is pen allergic. I have also discussed reasons to return immediately to the ER.  Patient expresses understanding and agrees with plan.  1. Medications: Norco, cephalexin 2. Treatment: Warm salt gargle 3. Follow  Up: ENT if symptoms persist           Dierdre Forth, PA-C 09/03/12 1613

## 2012-10-21 ENCOUNTER — Emergency Department (HOSPITAL_COMMUNITY)
Admission: EM | Admit: 2012-10-21 | Discharge: 2012-10-22 | Disposition: A | Discharge status: Home / Self Care | Attending: Emergency Medicine | Admitting: Emergency Medicine

## 2012-10-21 ENCOUNTER — Encounter (HOSPITAL_COMMUNITY): Payer: Self-pay

## 2012-10-21 DIAGNOSIS — E119 Type 2 diabetes mellitus without complications: Secondary | ICD-10-CM | POA: Insufficient documentation

## 2012-10-21 DIAGNOSIS — Z79899 Other long term (current) drug therapy: Secondary | ICD-10-CM | POA: Insufficient documentation

## 2012-10-21 DIAGNOSIS — R7309 Other abnormal glucose: Secondary | ICD-10-CM | POA: Insufficient documentation

## 2012-10-21 DIAGNOSIS — Z7982 Long term (current) use of aspirin: Secondary | ICD-10-CM | POA: Insufficient documentation

## 2012-10-21 DIAGNOSIS — R739 Hyperglycemia, unspecified: Secondary | ICD-10-CM

## 2012-10-21 DIAGNOSIS — Z794 Long term (current) use of insulin: Secondary | ICD-10-CM | POA: Insufficient documentation

## 2012-10-21 DIAGNOSIS — J45909 Unspecified asthma, uncomplicated: Secondary | ICD-10-CM | POA: Insufficient documentation

## 2012-10-21 DIAGNOSIS — F319 Bipolar disorder, unspecified: Secondary | ICD-10-CM | POA: Insufficient documentation

## 2012-10-21 DIAGNOSIS — Z8739 Personal history of other diseases of the musculoskeletal system and connective tissue: Secondary | ICD-10-CM | POA: Insufficient documentation

## 2012-10-21 DIAGNOSIS — I2510 Atherosclerotic heart disease of native coronary artery without angina pectoris: Secondary | ICD-10-CM | POA: Insufficient documentation

## 2012-10-21 LAB — CBC WITH DIFFERENTIAL/PLATELET
Basophils Absolute: 0 10*3/uL (ref 0.0–0.1)
Basophils Relative: 0 % (ref 0–1)
Eosinophils Absolute: 0.2 10*3/uL (ref 0.0–0.7)
Eosinophils Relative: 2 % (ref 0–5)
MCH: 27.4 pg (ref 26.0–34.0)
MCHC: 33.7 g/dL (ref 30.0–36.0)
MCV: 81.4 fL (ref 78.0–100.0)
Platelets: 220 10*3/uL (ref 150–400)
RDW: 12.9 % (ref 11.5–15.5)
WBC: 10.2 10*3/uL (ref 4.0–10.5)

## 2012-10-21 LAB — GLUCOSE, CAPILLARY: Glucose-Capillary: 590 mg/dL (ref 70–99)

## 2012-10-21 MED ORDER — DEXTROSE-NACL 5-0.9 % IV SOLN: Freq: Once | INTRAVENOUS | Status: DC

## 2012-10-21 MED ORDER — SODIUM CHLORIDE 0.9 % IV BOLUS (SEPSIS)
1000.0000 mL | Freq: Once | INTRAVENOUS | Status: AC
Start: 2012-10-21 — End: 2012-10-22
  Administered 2012-10-21: 1000 mL via INTRAVENOUS

## 2012-10-21 MED ORDER — ONDANSETRON HCL 4 MG/2ML IJ SOLN: 4.0000 mg | Freq: Once | INTRAMUSCULAR | Status: DC

## 2012-10-21 NOTE — ED Notes (Addendum)
Patient reports urinary frequency x3-4 days, patient denies any pain or other urinary changes except increased frequency. Also, patient states she attempted to see her primary care for Rx for diabetic medications. Primary refused to see patient due to balance on account. Patient states she has been without diabetic medications or supplies to monitor blood sugar approx 30 days.

## 2012-10-21 NOTE — ED Notes (Signed)
RN to bedside. Patient not in room.

## 2012-10-21 NOTE — ED Notes (Signed)
Pt presents with NAD- c/o of urinary frequency x 3 days no other complaints

## 2012-10-22 LAB — URINE MICROSCOPIC-ADD ON

## 2012-10-22 LAB — URINALYSIS, ROUTINE W REFLEX MICROSCOPIC
Glucose, UA: >1000 mg/dL — AB
Leukocytes, UA: NEGATIVE
Nitrite: NEGATIVE
Protein, ur: NEGATIVE mg/dL
Urobilinogen, UA: 0.2 mg/dL (ref 0.0–1.0)

## 2012-10-22 LAB — BASIC METABOLIC PANEL
Calcium: 10.3 mg/dL (ref 8.4–10.5)
Creatinine, Ser: 0.53 mg/dL (ref 0.50–1.10)
GFR calc non Af Amer: >90 mL/min (ref >90)
Sodium: 132 mEq/L — ABNORMAL LOW (ref 135–145)

## 2012-10-22 MED ORDER — SODIUM CHLORIDE 0.9 % IV BOLUS (SEPSIS)
1000.0000 mL | Freq: Once | INTRAVENOUS | Status: AC
  Administered 2012-10-22: 1000 mL via INTRAVENOUS

## 2012-10-22 MED ORDER — BLOOD GLUCOSE TEST VI STRP: 1.0000 | ORAL_STRIP | Freq: Every day | Status: DC

## 2012-10-22 MED ORDER — INSULIN ASPART 100 UNIT/ML ~~LOC~~ SOLN
10.0000 [IU] | Freq: Once | SUBCUTANEOUS | Status: AC
  Administered 2012-10-22: 10 [IU] via SUBCUTANEOUS
  Filled 2012-10-22: qty 1

## 2012-10-22 MED ORDER — SODIUM CHLORIDE 0.9 % IV SOLN: INTRAVENOUS | Status: DC

## 2012-10-22 MED ORDER — INSULIN DETEMIR 100 UNIT/ML ~~LOC~~ SOLN: 26.0000 [IU] | Freq: Every day | SUBCUTANEOUS | Status: DC

## 2012-10-22 MED ORDER — SITAGLIPTIN PHOS-METFORMIN HCL 50-1000 MG PO TABS: 1.0000 | ORAL_TABLET | Freq: Two times a day (BID) | ORAL | Status: DC

## 2012-10-22 NOTE — ED Notes (Signed)
Patient requesting Rx for glucometer strips. She states her machine is "Precision Xtra"

## 2012-10-22 NOTE — ED Notes (Signed)
Discharge instructions reviewed. All questions answered. Rx given x3.

## 2012-10-22 NOTE — ED Provider Notes (Signed)
History     CSN: [id number]  Arrival date & time 10/21/12  2056   First MD Initiated Contact with Patient 10/21/12 2357      Chief Complaint  Patient presents with  . Urinary Frequency    (Consider location/radiation/quality/duration/timing/severity/associated sxs/prior treatment) HPI This 51 year old diabetic states her primary care doctor will not refill her insulin and her diabetes medicines until the patient pacer bill, the patient has been out of her diabetes medicine for several weeks and has had gradual onset of polydipsia and polyuria. She has no fever no confusion no vomiting no abdominal pain chest pain cough or shortness of breath. She has no dysuria. She started hysterectomy. She feels fine but knows her blood sugars out-of-control and she has no testing strips and no diabetes medicines. No treatment PTA. Past Medical History  Diagnosis Date  . Diabetes mellitus   . Bipolar disorder   . Asthma   . Coronary artery disease   . Arthritis     Past Surgical History  Procedure Date  . Abdominal hysterectomy   . Tubal ligation   . Appendectomy   . Myomectomy   . Cesarean section   . Breast cyst excision   . Inner ear surgery     tubes in ears    Family History  Problem Relation Age of Onset  . Allergies Sister   . Allergies Daughter   . Allergies Daughter   . Asthma Daughter   . Asthma Daughter     History  Substance Use Topics  . Smoking status: Never Smoker   . Smokeless tobacco: Never Used  . Alcohol Use: No    OB History    Grav Para Term Preterm Abortions TAB SAB Ect Mult Living                  Review of Systems 10 Systems reviewed and are negative for acute change except as noted in the HPI. Allergies  Amoxicillin  Home Medications   Current Outpatient Rx  Name  Route  Sig  Dispense  Refill  . ALBUTEROL SULFATE HFA 108 (90 BASE) MCG/ACT IN AERS   Inhalation   Inhale 2 puffs into the lungs 2 (two) times daily as needed. For shortness  of breath         . ALBUTEROL SULFATE (2.5 MG/3ML) 0.083% IN NEBU   Nebulization   Take 2.5 mg by nebulization every 6 (six) hours as needed. For shortness of breath or wheezing         . ASPIRIN EC 81 MG PO TBEC   Oral   Take 81 mg by mouth daily.         [name] Kitchen HYDROCODONE-ACETAMINOPHEN 5-325 MG PO TABS   Oral   Take 2 tablets by mouth every 4 (four) hours as needed for pain.   6 tablet   0   . LAMOTRIGINE 100 MG PO TABS   Oral   Take 100 mg by mouth daily.          . SERTRALINE HCL 25 MG PO TABS   Oral   Take 25 mg by mouth daily.          [name] Kitchen BLOOD GLUCOSE TEST VI STRP   Does not apply   1 each by Does not apply route daily.   100 each   0   . INSULIN DETEMIR 100 UNIT/ML [place] SOLN   Subcutaneous   Inject 26 Units into the skin at bedtime. Sliding scale   3  mL   10   . SITAGLIPTIN-METFORMIN HCL 50-1000 MG PO TABS   Oral   Take 1 tablet by mouth 2 (two) times daily with a meal.   60 tablet   1     BP 157/84  Pulse 85  Temp 98.3 F (36.8 C) (Oral)  Resp 16  Ht 5\' 3"  (1.6 m)  Wt 198 lb (89.812 kg)  BMI 35.07 kg/m2  SpO2 97%  Physical Exam  Nursing note and vitals reviewed. Constitutional:       Awake, alert, nontoxic appearance.  HENT:  Head: Atraumatic.  Eyes: Right eye exhibits no discharge. Left eye exhibits no discharge.  Neck: Neck supple.  Cardiovascular: Normal rate and regular rhythm.   No murmur heard. Pulmonary/Chest: Effort normal and breath sounds normal. No respiratory distress. She has no wheezes. She has no rales. She exhibits no tenderness.  Abdominal: Soft. There is no tenderness. There is no rebound.  Musculoskeletal: She exhibits no tenderness.       Baseline ROM, no obvious new focal weakness.  Neurological: She is alert.       Mental status and motor strength appears baseline for patient and situation.  Skin: No rash noted.  Psychiatric: She has a normal mood and affect.    ED Course  Procedures (including critical care  time) 0105: Lab reports still does not have Pt's urine collected at 2200 on chart. Labs Reviewed  URINALYSIS, ROUTINE W REFLEX MICROSCOPIC - Abnormal; Notable for the following:    Specific Gravity, Urine 1.039 (*)     Glucose, UA >1000 (*)     All other components within normal limits  GLUCOSE, CAPILLARY - Abnormal; Notable for the following:    Glucose-Capillary 590 (*)     All other components within normal limits  CBC WITH DIFFERENTIAL - Abnormal; Notable for the following:    Lymphs Abs 4.2 (*)     All other components within normal limits  BASIC METABOLIC PANEL - Abnormal; Notable for the following:    Sodium 132 (*)     Chloride 95 (*)     Glucose, Bld 574 (*)     All other components within normal limits  GLUCOSE, CAPILLARY - Abnormal; Notable for the following:    Glucose-Capillary 410 (*)     All other components within normal limits  URINE MICROSCOPIC-ADD ON   No results found.   1. Hyperglycemia   2. Diabetes mellitus       MDM   Pt stable in ED with no significant deterioration in condition.  Patient / Family / Caregiver informed of clinical course, understand medical decision-making process, and agree with plan.  I doubt any other EMC precluding discharge at this time including, but not necessarily limited to the following:DKA, SBI.      Hurman Horn, MD 10/22/12 1902

## 2012-10-22 NOTE — ED Notes (Signed)
Critical Glucose of 574. MD aware.

## 2013-01-02 ENCOUNTER — Emergency Department (HOSPITAL_COMMUNITY)

## 2013-01-02 ENCOUNTER — Emergency Department (HOSPITAL_COMMUNITY)
Admission: EM | Admit: 2013-01-02 | Discharge: 2013-01-02 | Disposition: A | Discharge status: Home / Self Care | Attending: Emergency Medicine | Admitting: Emergency Medicine

## 2013-01-02 ENCOUNTER — Encounter (HOSPITAL_COMMUNITY): Payer: Self-pay | Admitting: Emergency Medicine

## 2013-01-02 DIAGNOSIS — I2510 Atherosclerotic heart disease of native coronary artery without angina pectoris: Secondary | ICD-10-CM | POA: Insufficient documentation

## 2013-01-02 DIAGNOSIS — Y929 Unspecified place or not applicable: Secondary | ICD-10-CM | POA: Insufficient documentation

## 2013-01-02 DIAGNOSIS — W2209XA Striking against other stationary object, initial encounter: Secondary | ICD-10-CM | POA: Insufficient documentation

## 2013-01-02 DIAGNOSIS — S93609A Unspecified sprain of unspecified foot, initial encounter: Secondary | ICD-10-CM | POA: Insufficient documentation

## 2013-01-02 DIAGNOSIS — S93509A Unspecified sprain of unspecified toe(s), initial encounter: Secondary | ICD-10-CM

## 2013-01-02 DIAGNOSIS — M129 Arthropathy, unspecified: Secondary | ICD-10-CM | POA: Insufficient documentation

## 2013-01-02 DIAGNOSIS — Y9389 Activity, other specified: Secondary | ICD-10-CM | POA: Insufficient documentation

## 2013-01-02 DIAGNOSIS — Z7982 Long term (current) use of aspirin: Secondary | ICD-10-CM | POA: Insufficient documentation

## 2013-01-02 DIAGNOSIS — F319 Bipolar disorder, unspecified: Secondary | ICD-10-CM | POA: Insufficient documentation

## 2013-01-02 DIAGNOSIS — Z79899 Other long term (current) drug therapy: Secondary | ICD-10-CM | POA: Insufficient documentation

## 2013-01-02 DIAGNOSIS — Z794 Long term (current) use of insulin: Secondary | ICD-10-CM | POA: Insufficient documentation

## 2013-01-02 DIAGNOSIS — E119 Type 2 diabetes mellitus without complications: Secondary | ICD-10-CM | POA: Insufficient documentation

## 2013-01-02 DIAGNOSIS — J45909 Unspecified asthma, uncomplicated: Secondary | ICD-10-CM | POA: Insufficient documentation

## 2013-01-02 MED ORDER — IBUPROFEN 600 MG PO TABS: 600.0000 mg | ORAL_TABLET | Freq: Four times a day (QID) | ORAL | Status: DC | PRN

## 2013-01-02 NOTE — ED Provider Notes (Signed)
History     CSN: [id number]  Arrival date & time 01/02/13  1353   First MD Initiated Contact with Patient 01/02/13 1505      Chief Complaint  Patient presents with  . Toe Pain    (Consider location/radiation/quality/duration/timing/severity/associated sxs/prior treatment) HPI Comments: Patient is a 52 y.o. Women who presents to the ED complaining of right toe pain.  Patient states that at 8:00 am this morning she stubbed her right toe on her daughters snow boot.  Pain is radiating to the top of the foot.  Pain is rated as a 7/10.  The pain is relieved slightly with elevation, and is made worse with movement.  The patient also requests that she be given information for referral to another PCP for her diabetes management.    Patient is a 52 y.o. female presenting with toe pain. The history is provided by the patient. No language interpreter was used.  Toe Pain Associated symptoms include arthralgias.    Past Medical History  Diagnosis Date  . Diabetes mellitus   . Bipolar disorder   . Asthma   . Coronary artery disease   . Arthritis     Past Surgical History  Procedure Date  . Abdominal hysterectomy   . Tubal ligation   . Appendectomy   . Myomectomy   . Cesarean section   . Breast cyst excision   . Inner ear surgery     tubes in ears    Family History  Problem Relation Age of Onset  . Allergies Sister   . Allergies Daughter   . Allergies Daughter   . Asthma Daughter   . Asthma Daughter     History  Substance Use Topics  . Smoking status: Never Smoker   . Smokeless tobacco: Never Used  . Alcohol Use: No    OB History    Grav Para Term Preterm Abortions TAB SAB Ect Mult Living                  Review of Systems  Musculoskeletal: Positive for arthralgias.  All other systems reviewed and are negative.    Allergies  Amoxicillin  Home Medications   Current Outpatient Rx  Name  Route  Sig  Dispense  Refill  . ALBUTEROL SULFATE HFA 108 (90 BASE)  MCG/ACT IN AERS   Inhalation   Inhale 2 puffs into the lungs 2 (two) times daily as needed. For shortness of breath         . ALBUTEROL SULFATE (2.5 MG/3ML) 0.083% IN NEBU   Nebulization   Take 2.5 mg by nebulization every 6 (six) hours as needed. For shortness of breath or wheezing         . ASPIRIN EC 81 MG PO TBEC   Oral   Take 81 mg by mouth daily.         [name] Kitchen BLOOD GLUCOSE TEST VI STRP   Does not apply   1 each by Does not apply route daily.   100 each   0   . INSULIN DETEMIR 100 UNIT/ML [place] SOLN   Subcutaneous   Inject 26 Units into the skin at bedtime. Sliding scale   3 mL   10   . LAMOTRIGINE 100 MG PO TABS   Oral   Take 100 mg by mouth daily.          . SERTRALINE HCL 25 MG PO TABS   Oral   Take 25 mg by mouth daily.          [name] Kitchen  SITAGLIPTIN-METFORMIN HCL 50-1000 MG PO TABS   Oral   Take 1 tablet by mouth 2 (two) times daily with a meal.   60 tablet   1     BP 127/74  Pulse 81  Temp 98.2 F (36.8 C) (Oral)  Resp 18  SpO2 100%  Physical Exam  Nursing note and vitals reviewed. Constitutional: She is oriented to person, place, and time. She appears well-developed and well-nourished. No distress.  HENT:  Head: Normocephalic and atraumatic.  Eyes: Conjunctivae normal and EOM are normal. Right eye exhibits no discharge. Left eye exhibits no discharge. No scleral icterus.  Neck: Normal range of motion.  Cardiovascular: Normal rate, regular rhythm and intact distal pulses.   Pulmonary/Chest: Effort normal and breath sounds normal.  Musculoskeletal: She exhibits no edema.       Right ankle: Normal.       Feet:       Good capillary refill.  Neurological: She is alert and oriented to person, place, and time.  Skin: Skin is warm, dry and intact. No bruising and no ecchymosis noted.  Psychiatric: She has a normal mood and affect. Her behavior is normal.    ED Course  Procedures (including critical care time)  Labs Reviewed - No data to display Dg  Foot Complete Right  01/02/2013  *RADIOLOGY REPORT*  Clinical Data: Foot injury.  Pain in the third digit.  RIGHT FOOT COMPLETE - 3+ VIEW  Comparison: None.  Findings: No acute bone or soft tissue abnormality is present.  IMPRESSION: Negative right foot.   Original Report Authenticated By: Marin Roberts, M.D.      1. Toe sprain       MDM  -year-old female with right toe sprain. X-ray negative for any acute bony abnormality. Advised rest, ice and elevation. Postop boot and crutches given for comfort. Also advised ibuprofen every 6-8 hours. Resource list to establish care with a new primary care physician given. Return precautions discussed. Patient states her understanding of plan and is agreeable.        Trevor Mace, PA-C 01/02/13 1542

## 2013-01-02 NOTE — ED Notes (Signed)
Ortho tech at bedside 

## 2013-01-02 NOTE — ED Notes (Signed)
Ortho tech paged for post-op boot and crutches.

## 2013-01-02 NOTE — ED Notes (Signed)
Pt states that she stubbed her right 4th toe this morning.  C/o pain 7/10.

## 2013-01-02 NOTE — ED Provider Notes (Signed)
Medical screening examination/treatment/procedure(s) were performed by non-physician practitioner and as supervising physician I was immediately available for consultation/collaboration.   Dione Booze, MD 01/02/13 [phone]

## 2013-01-31 ENCOUNTER — Ambulatory Visit: Admitting: Family Medicine

## 2013-01-31 ENCOUNTER — Emergency Department (HOSPITAL_COMMUNITY)
Admission: EM | Admit: 2013-01-31 | Discharge: 2013-01-31 | Disposition: A | Discharge status: Home / Self Care | Attending: Emergency Medicine | Admitting: Emergency Medicine

## 2013-01-31 ENCOUNTER — Encounter (HOSPITAL_COMMUNITY): Payer: Self-pay | Admitting: Emergency Medicine

## 2013-01-31 DIAGNOSIS — F319 Bipolar disorder, unspecified: Secondary | ICD-10-CM | POA: Insufficient documentation

## 2013-01-31 DIAGNOSIS — I2510 Atherosclerotic heart disease of native coronary artery without angina pectoris: Secondary | ICD-10-CM | POA: Insufficient documentation

## 2013-01-31 DIAGNOSIS — Z79899 Other long term (current) drug therapy: Secondary | ICD-10-CM | POA: Insufficient documentation

## 2013-01-31 DIAGNOSIS — Z794 Long term (current) use of insulin: Secondary | ICD-10-CM | POA: Insufficient documentation

## 2013-01-31 DIAGNOSIS — M2550 Pain in unspecified joint: Secondary | ICD-10-CM

## 2013-01-31 DIAGNOSIS — J45909 Unspecified asthma, uncomplicated: Secondary | ICD-10-CM | POA: Insufficient documentation

## 2013-01-31 DIAGNOSIS — M2559 Pain in other specified joint: Secondary | ICD-10-CM | POA: Insufficient documentation

## 2013-01-31 DIAGNOSIS — Z7982 Long term (current) use of aspirin: Secondary | ICD-10-CM | POA: Insufficient documentation

## 2013-01-31 DIAGNOSIS — E119 Type 2 diabetes mellitus without complications: Secondary | ICD-10-CM | POA: Insufficient documentation

## 2013-01-31 MED ORDER — HYDROCODONE-ACETAMINOPHEN 5-325 MG PO TABS: ORAL_TABLET | ORAL | Status: DC

## 2013-01-31 MED ORDER — IBUPROFEN 600 MG PO TABS: 600.0000 mg | ORAL_TABLET | Freq: Four times a day (QID) | ORAL | Status: DC | PRN

## 2013-01-31 MED ORDER — KETOROLAC TROMETHAMINE 30 MG/ML IJ SOLN
30.0000 mg | Freq: Once | INTRAMUSCULAR | Status: AC
  Administered 2013-01-31: 30 mg via INTRAMUSCULAR
  Filled 2013-01-31: qty 1

## 2013-01-31 NOTE — ED Notes (Signed)
Pt states that she has rheumatoid arthritis and had an appt with her doctor this morning but it was cancelled due to the weather.  States that everything hurts.

## 2013-01-31 NOTE — ED Provider Notes (Signed)
History     CSN: [id number]  Arrival date & time 01/31/13  0844   First MD Initiated Contact with Patient 01/31/13 [phone]      Chief Complaint  Patient presents with  . Arthritis    (Consider location/radiation/quality/duration/timing/severity/associated sxs/prior treatment) HPI Comments: Patient with history of diabetes, normal renal function as of 3 months ago -- presents with complaint of joint pain. Patient states that she was diagnosed with rheumatoid arthritis approximately 3 months ago. She is not currently on any preventative medications for this. Patient states that she has had worsening joint pain over the past 3 days. The pain is worse in her bilateral wrists, elbows, shoulders, knees, and ankles. Patient states that she could not sleep last night to the pain. She's been taking 800 mg of ibuprofen which has helped temporarily. She denies fever, nausea, or vomiting. No skin rashes or redness. No joint swelling. Onset of symptoms gradual. Course is gradually worsening. Movement makes the symptoms worse.  Patient is a 52 y.o. female presenting with arthritis. The history is provided by the patient.  Arthritis Associated symptoms include arthralgias. Pertinent negatives include no fever, joint swelling, nausea, neck pain, numbness, rash, vomiting or weakness.    Past Medical History  Diagnosis Date  . Diabetes mellitus   . Bipolar disorder   . Asthma   . Coronary artery disease   . Arthritis     Past Surgical History  Procedure Laterality Date  . Abdominal hysterectomy    . Tubal ligation    . Appendectomy    . Myomectomy    . Cesarean section    . Breast cyst excision    . Inner ear surgery      tubes in ears    Family History  Problem Relation Age of Onset  . Allergies Sister   . Allergies Daughter   . Allergies Daughter   . Asthma Daughter   . Asthma Daughter     History  Substance Use Topics  . Smoking status: Never Smoker   . Smokeless tobacco: Never  Used  . Alcohol Use: No    OB History   Grav Para Term Preterm Abortions TAB SAB Ect Mult Living                  Review of Systems  Constitutional: Negative for fever and activity change.  HENT: Negative for neck pain.   Gastrointestinal: Negative for nausea and vomiting.  Musculoskeletal: Positive for arthralgias and arthritis. Negative for back pain and joint swelling.  Skin: Negative for color change, rash and wound.  Neurological: Negative for weakness and numbness.    Allergies  Amoxicillin  Home Medications   Current Outpatient Rx  Name  Route  Sig  Dispense  Refill  . albuterol (PROVENTIL HFA) 108 (90 BASE) MCG/ACT inhaler   Inhalation   Inhale 2 puffs into the lungs 2 (two) times daily as needed. For shortness of breath         . albuterol (PROVENTIL) (2.5 MG/3ML) 0.083% nebulizer solution   Nebulization   Take 2.5 mg by nebulization every 6 (six) hours as needed. For shortness of breath or wheezing         . aspirin EC 81 MG tablet   Oral   Take 81 mg by mouth daily.         . Glucose Blood (BLOOD GLUCOSE TEST STRIPS) STRP   Does not apply   1 each by Does not apply route daily.  100 each   0   . HYDROcodone-acetaminophen (NORCO/VICODIN) 5-325 MG per tablet      Take 1-2 tablets every 6 hours as needed for severe pain   10 tablet   0   . ibuprofen (ADVIL,MOTRIN) 600 MG tablet   Oral   Take 1 tablet (600 mg total) by mouth every 6 (six) hours as needed for pain.   20 tablet   0   . insulin detemir (LEVEMIR) 100 UNIT/ML injection   Subcutaneous   Inject 26 Units into the skin at bedtime. Sliding scale   3 mL   10   . lamoTRIgine (LAMICTAL) 100 MG tablet   Oral   Take 100 mg by mouth daily.          . sertraline (ZOLOFT) 25 MG tablet   Oral   Take 25 mg by mouth daily.          . sitaGLIPtan-metformin (JANUMET) 50-1000 MG per tablet   Oral   Take 1 tablet by mouth 2 (two) times daily with a meal.   60 tablet   1     BP  138/86  Pulse 94  Temp(Src) 98 F (36.7 C) (Oral)  Resp 16  SpO2 100%  Physical Exam  Nursing note and vitals reviewed. Constitutional: She appears well-developed and well-nourished.  HENT:  Head: Normocephalic and atraumatic.  Eyes: Pupils are equal, round, and reactive to light.  Neck: Normal range of motion. Neck supple.  Cardiovascular: Intact distal pulses.  Exam reveals no decreased pulses.   Musculoskeletal: She exhibits tenderness. She exhibits no edema.  Generalized tenderness with worsening pain with movement of bilateral wrists, elbows, shoulders, knees, and ankles. There is no swelling of these joints. There is no overlying redness or erythema. Patient has full active range of motion with pain.  Neurological: She is alert. No sensory deficit.  Motor, sensation, and vascular distal to the injury is fully intact.   Skin: Skin is warm and dry.  Psychiatric: She has a normal mood and affect.    ED Course  Procedures (including critical care time)  Labs Reviewed - No data to display No results found.   1. Joint pain    9:07 AM Patient seen and examined. Labs from 3 months ago reviewed. Normal crt at that time. Toradol in ED.    Vital signs reviewed and are as follows: Filed Vitals:   01/31/13 0849  BP: 138/86  Pulse: 94  Temp: 98 F (36.7 C)  Resp: 16   Patient counseled on use of narcotic pain medications. Counseled not to combine these medications with others containing tylenol. Urged not to drink alcohol, drive, or perform any other activities that requires focus while taking these medications. The patient verbalizes understanding and agrees with the plan.     MDM  Arthritis pain, possibly rheumatoid? No redness, fever, or joint swelling to suggest septic arthritis or systemic illness. Patient appears well.         [place], [place] 01/31/13 [phone]

## 2013-02-01 NOTE — ED Provider Notes (Signed)
Medical screening examination/treatment/procedure(s) were performed by non-physician practitioner and as supervising physician I was immediately available for consultation/collaboration.  Mckaylah Bettendorf, MD 02/01/13 0707 

## 2013-02-05 ENCOUNTER — Encounter: Payer: Self-pay | Admitting: Family Medicine

## 2013-02-05 ENCOUNTER — Ambulatory Visit (INDEPENDENT_AMBULATORY_CARE_PROVIDER_SITE_OTHER): Admitting: Family Medicine

## 2013-02-05 VITALS — BP 124/82 | HR 89 | Temp 98.2°F | Ht 64.75 in | Wt 205.0 lb

## 2013-02-05 DIAGNOSIS — M2550 Pain in unspecified joint: Secondary | ICD-10-CM

## 2013-02-05 DIAGNOSIS — Z7689 Persons encountering health services in other specified circumstances: Secondary | ICD-10-CM

## 2013-02-05 DIAGNOSIS — E1149 Type 2 diabetes mellitus with other diabetic neurological complication: Secondary | ICD-10-CM

## 2013-02-05 DIAGNOSIS — Z23 Encounter for immunization: Secondary | ICD-10-CM

## 2013-02-05 DIAGNOSIS — F319 Bipolar disorder, unspecified: Secondary | ICD-10-CM | POA: Insufficient documentation

## 2013-02-05 DIAGNOSIS — Z7189 Other specified counseling: Secondary | ICD-10-CM

## 2013-02-05 HISTORY — DX: Pain in unspecified joint: M25.50

## 2013-02-05 LAB — MICROALBUMIN / CREATININE URINE RATIO
Creatinine,U: 180.9 mg/dL
Microalb, Ur: 14.7 mg/dL — ABNORMAL HIGH (ref 0.0–1.9)

## 2013-02-05 LAB — BASIC METABOLIC PANEL
Calcium: 9.4 mg/dL (ref 8.4–10.5)
Creatinine, Ser: 0.6 mg/dL (ref 0.4–1.2)

## 2013-02-05 LAB — LIPID PANEL
HDL: 48.1 mg/dL (ref >39.00)
Total CHOL/HDL Ratio: 4
Triglycerides: 86 mg/dL (ref 0.0–149.0)
VLDL: 17.2 mg/dL (ref 0.0–40.0)

## 2013-02-05 LAB — HEMOGLOBIN A1C: Hgb A1c MFr Bld: 8 % — ABNORMAL HIGH (ref 4.6–6.5)

## 2013-02-05 MED ORDER — ONETOUCH DELICA LANCETS FINE MISC: 1.0000 | Freq: Two times a day (BID) | Status: DC

## 2013-02-05 MED ORDER — GLUCOSE BLOOD VI STRP: ORAL_STRIP | Status: DC

## 2013-02-05 MED ORDER — SITAGLIPTIN PHOS-METFORMIN HCL 50-1000 MG PO TABS: 1.0000 | ORAL_TABLET | Freq: Two times a day (BID) | ORAL | Status: DC

## 2013-02-05 MED ORDER — INSULIN DETEMIR 100 UNIT/ML ~~LOC~~ SOLN: 74.0000 [IU] | Freq: Every day | SUBCUTANEOUS | Status: DC

## 2013-02-05 MED ORDER — INSULIN PEN NEEDLE 32G X 4 MM MISC: Status: DC

## 2013-02-05 NOTE — Patient Instructions (Addendum)
-  We have ordered labs or studies at this visit. It can take up to 1-2 weeks for results and processing. We will contact you with instructions IF your results are abnormal. Normal results will be released to your [facility]. If you have not heard from [place] or can not find your results in [facility] in 2 weeks please contact our office.  -PLEASE SIGN UP FOR MYCHART TODAY   We recommend the following healthy lifestyle measures: - eat a healthy diet consisting of lots of vegetables, fruits, beans, nuts, seeds, healthy meats such as white chicken and fish and whole grains.  - avoid fried foods, fast food, processed foods, sodas, red meet and other fattening foods.  - get a least 150 minutes of aerobic exercise per week.   -We placed a referral for you as discussed. It usually takes about 1-2 weeks to process and schedule this referral. If you have not heard from [place] regarding this appointment in 2 weeks please contact our office.  Please keep log of blood sugars: FASTING (before eating anything in the morning) POSTPRANDIAL (2 hours after meals) Bring log to all appointments  Follow up in: 1 month

## 2013-02-05 NOTE — Progress Notes (Signed)
Chief Complaint  Patient presents with  . Establish Care  . Rheumatoid Arthritis    HPI:  Kerri PIGGOTT is here to establish care. Per review of chart she has frequented the Emergency Department for several years for many minor complaints. It appears per her report in notes that she was discharged from her prior PCP office for not paying her bills. Her bloods sugars on most checks in the ED have been very high for a long time. She reports she did exercise and a healthy diet but this did not work. She also was upset because she had to take 3 medications for her diabetes. She reports she preferred to try something herbal for her diabetes. She would like to see an endocrinologist.   Last PCP and physical: Velna Hatchet  Has the following chronic problems and concerns today:  Patient Active Problem List  Diagnosis  . Type II or unspecified type diabetes mellitus with neurological manifestations, uncontrolled(250.62)  . Bipolar affective disorder  . Polyarthralgia   DM: -taking janumet 50-1000 bid -levemir 76 units before bed -lifestyle: kickboxing and personal training MWF -vegetarian and trying to go vegan  -fasting 64-207 over last several week -on ASA  Polyarthralgia: -reports knee pain for years - and loss of cartilage in knees -over last year has had chronic pain in bilat knees, ankles, wrists, elbows and shoulders -about 2 years ago went to ED in [place] - for tingling in fingers, reports she was told she had RA - reports given oxycontin and vicodin - she doesn't want to take these -she wants to see a rheumatologist - she is sick of taking medications  Psych/Bipolar: -followed by [facility]  Health Maintenance: ROS: See pertinent positives and negatives per HPI.  Past Medical History  Diagnosis Date  . Diabetes mellitus   . Bipolar disorder   . Asthma   . Coronary artery disease   . Arthritis   . Dyspnea 04/13/2011    pfts 03/2011:  Normal FEV1 and FEV1/FVC Ct chest  2012:  No PE, normal parenchyma Cleda Daub 04/18/2011 with active symptoms:  Normal FEV1%, mild decrease in FVC due to restriction vs airtrapping, normal appearing       flow volume loop.    . Depression   . Chicken pox   . Seasonal allergies   . Migraines   . 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 Allergies Sister   . Allergies Daughter   . Allergies Daughter   . Asthma Daughter   . Asthma Daughter   . Arthritis Maternal Grandmother   . Hyperlipidemia Maternal Grandmother   . Hypertension Maternal Aunt   . Sudden death Mother   . Sudden death Father   . Kidney failure Mother   . Mental illness      History   Social History  . Marital Status: Married    Spouse Name: N/A    Number of Children: 2  . Years of Education: N/A   Occupational History  . massage therapist and Esthetician    Social History Main Topics  . Smoking status: Never Smoker   . Smokeless tobacco: Never Used  . Alcohol Use: No  . Drug Use: No  . Sexually Active: None   Other Topics Concern  . 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      Spiritual Beliefs:       Lifestyle: MWF does kickboxing, vegetarian - trying to go vegan             Current outpatient prescriptions:aspirin EC 81 MG tablet, Take 81 mg by mouth daily., Disp: , Rfl: ;  Glucose Blood (BLOOD GLUCOSE TEST STRIPS) STRP, 1 each by Does not apply route daily., Disp: 100 each, Rfl: 0;  insulin detemir (LEVEMIR) 100 UNIT/ML injection, Inject 74 Units into the skin at bedtime. Sliding scale, Disp: , Rfl: ;  lamoTRIgine (LAMICTAL) 100 MG tablet, Take 100 mg by mouth daily. , Disp: , Rfl:  sertraline (ZOLOFT) 25 MG tablet, Take 25 mg by mouth daily. , Disp: , Rfl: ;  sitaGLIPtan-metformin (JANUMET) 50-1000 MG per tablet, Take 1 tablet by mouth 2 (two) times daily with a meal., Disp:  60 tablet, Rfl: 1;  albuterol (PROVENTIL HFA) 108 (90 BASE) MCG/ACT inhaler, Inhale 2 puffs into the lungs 2 (two) times daily as needed. For shortness of breath, Disp: , Rfl:   EXAM:  Filed Vitals:   02/05/13 0904  BP: 124/82  Pulse: 89  Temp: 98.2 F (36.8 C)    Body mass index is 34.36 kg/(m^2).  GENERAL: vitals reviewed and listed above, alert, oriented, appears well hydrated and in no acute distress  HEENT: atraumatic, conjunttiva clear, no obvious abnormalities on inspection of external nose and ears  NECK: no obvious masses on inspection  LUNGS: clear to auscultation bilaterally, no wheezes, rales or rhonchi, good air movement  CV: HRRR, no peripheral edema  MS: moves all extremities without noticeable abnormality  PSYCH: pleasant and cooperative, no obvious depression or anxiety  Diabetic foot exam: see chart, abnormal  ASSESSMENT AND PLAN:  Discussed the following assessment and plan:  Type II or unspecified type diabetes mellitus with neurological manifestations, uncontrolled(250.62) - Plan: Hemoglobin A1c, Microalbumin/Creatinine Ratio, Urine  Bipolar affective disorder  Polyarthralgia - Plan: Ambulatory referral to Rheumatology  Establishing care with new doctor, encounter for - Plan: Lipid Panel, Basic metabolic panel -We reviewed the PMH, PSH, FH, SH, Meds and Allergies. -pneumovax given today -she is very frustrated with prior care for her diabetes and polyarthralgias and wants to see endocrine and rheum for recs. Referrals placed. -FASTING LABS per orders -will obtain records from workup in [place] and prior PCP -follow up in 1 month ->45 minutes spent in face to face interaction, most time spent on education regarding diabetes and diabetes management   -Patient advised to return or notify a doctor immediately if symptoms worsen or persist or new concerns arise.  Patient Instructions  -We have ordered labs or studies at this visit. It can take up  to 1-2 weeks for results and processing. We will contact you with instructions IF your results are abnormal. Normal results will be released to your [facility]. If you have not heard from [place] or can not find your results in [facility] in 2 weeks please contact our office.  -PLEASE SIGN UP FOR MYCHART TODAY   We recommend the following healthy lifestyle measures: - eat a healthy diet consisting of lots of vegetables, fruits, beans, nuts, seeds, healthy meats such as white chicken and fish and whole grains.  - avoid fried foods, fast food, processed foods, sodas, red meet and other fattening foods.  - get a least 150 minutes of aerobic exercise per week.   -We placed a referral for you as discussed. It usually takes  about 1-2 weeks to process and schedule this referral. If you have not heard from [place] regarding this appointment in 2 weeks please contact our office.  Please keep log of blood sugars: FASTING (before eating anything in the morning) POSTPRANDIAL (2 hours after meals) Bring log to all appointments  Follow up in: 1 month      KIM, HANNAH R.

## 2013-02-05 NOTE — Addendum Note (Signed)
Addended by: Azucena Freed on: 02/05/2013 10:39 AM   Modules accepted: Orders, Medications

## 2013-02-05 NOTE — Addendum Note (Signed)
Addended by: Azucena Freed on: 02/05/2013 10:19 AM   Modules accepted: Orders

## 2013-02-06 ENCOUNTER — Telehealth: Payer: Self-pay | Admitting: Family Medicine

## 2013-02-06 DIAGNOSIS — E1149 Type 2 diabetes mellitus with other diabetic neurological complication: Secondary | ICD-10-CM

## 2013-02-06 NOTE — Telephone Encounter (Signed)
Please let her know:  Her HgbA1c (diabetes lab) is 8.0 (goal is less then 7) -this is likely an improvement looking back at her blood sugars in the past - so keep up the good work with the exercise and with eating healthy. As we discussed we had placed referral to the endocrinologist and they will help her further with her medications for her diabetes. Keep the log of her BS per instructions I gave her and take log to her appointments.  Cholesterol is high. We can discuss this at her follow up appointment with me.  Continue to work on increasing days of exercise and on the healthy diet.

## 2013-02-06 NOTE — Telephone Encounter (Signed)
Called and spoke with pt and pt is aware.  

## 2013-02-12 ENCOUNTER — Encounter: Payer: Self-pay | Admitting: Family Medicine

## 2013-02-12 ENCOUNTER — Ambulatory Visit (INDEPENDENT_AMBULATORY_CARE_PROVIDER_SITE_OTHER): Admitting: Internal Medicine

## 2013-02-12 ENCOUNTER — Encounter: Payer: Self-pay | Admitting: Internal Medicine

## 2013-02-12 VITALS — BP 134/88 | HR 86 | Temp 98.1°F | Resp 16 | Ht 64.0 in | Wt 205.0 lb

## 2013-02-12 DIAGNOSIS — E1149 Type 2 diabetes mellitus with other diabetic neurological complication: Secondary | ICD-10-CM

## 2013-02-12 NOTE — Progress Notes (Addendum)
Subjective:     Patient ID: Kerri Osborn, female   DOB: [birth year], 52 y.o.   MRN: [id number]  HPI Kerri Osborn is a 52 year old African American woman, referred by her PCP, Dr. Selena Batten, for management of DM2, without complications, uncontrolled, insulin-dependent.  Pt was dx with DM2 2008. She was part of a a diabetic study and has started Janumet which she has been on since then. She started insulin 2 years ago. Her last A1c was 8.0% on 02/05/2013. At that time BUN/creatinine ratio was 14/0.6, and an albumin to creatinine ratio was 8.1. She is on a regimen of: - Levemir 76-80, depending on morning sugars (!) - Janumet 50/1000 twice a day On this regimen, her fasting sugars are between: - am: 139-241, most 150s  She has hypoglycemia awareness at 60. Had 1 value at 60 in the last 2 months. She has had elevated blood sugars whenever she presented to the ED, which was fairly frequently for minor complaints in the last year.   She is exercising frequently, doing kickboxing and having a personal trainer on Monday, Wednesday, and Friday. She will start 2 days a week biking. She also tries to eat healthy, she is a vegetarian (started 2 years ago) but trying to get vegan. She is frustrated about her complicated diabetes regimen and would like to try herbal medicines or something simpler.   Meals: - breakfast: oatmeal, miso-soup, grits, oatmeal toast - lunch: salad + soy meats, noodles, miso soup, humus, veggies - dinner: largest meal of the day: soup + salad (+ dressing bought)  + soy meats - snacks: 2x a day - apple, protein shakes - whey proteins, humus, vegetables, almonds (not salted)  Last eye appt: >1 year. Reportedly, no DR. No CKD. She does have heel burning when touches the ground, no pain at night.  She also has a history of being bipolar, having rheumatoid arthritis, being overweight. having migraines, and asthma.  Review of Systems Constitutional: + weight gain, + fatigue, hot flushes,  poor sleep Eyes: no blurry vision, no xerophthalmia ENT: no sore throat, no nodules palpated in throat, no dysphagia/odynophagia, no hoarseness Cardiovascular: no CP/+SOB/palpitations/leg swelling Respiratory: no cough/+ SOB/ + wheezing Gastrointestinal: no + N/V/+D/C Musculoskeletal: + muscle/joint aches Skin: Rash Neurological: no tremors/numbness/tingling/dizziness, + HA Psychiatric: no depression/anxiety Low libido Past Medical History  Diagnosis Date  . Diabetes mellitus   . Bipolar disorder   . Asthma   . Coronary artery disease   . Arthritis   . Dyspnea 04/13/2011    pfts 03/2011:  Normal FEV1 and FEV1/FVC Ct chest 2012:  No PE, normal parenchyma Cleda Daub 04/18/2011 with active symptoms:  Normal FEV1%, mild decrease in FVC due to restriction vs airtrapping, normal appearing       flow volume loop.    . Depression   . Chicken pox   . Seasonal allergies   . Migraines   . 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 Abdominal hysterectomy    . Tubal ligation    . Appendectomy    . Myomectomy    . Cesarean section    . Breast cyst excision    . Inner ear surgery      tubes in ears   History   Social History  . Marital Status: Married    Spouse Name: N/A    Number of Children: 2  . Years of Education: N/A   Occupational  History  . massage therapist and Esthetician    Social History Main Topics  . Smoking status: Never Smoker   . Smokeless tobacco: Never Used  . Alcohol Use: No  . Drug Use: No  . Sexually Active: Not on file   Other Topics Concern  . 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      Spiritual Beliefs:       Lifestyle: MWF does kickboxing, vegetarian - trying to go vegan            Current Outpatient Prescriptions on File Prior to Visit  Medication Sig Dispense Refill  .  albuterol (PROVENTIL HFA) 108 (90 BASE) MCG/ACT inhaler Inhale 2 puffs into the lungs 2 (two) times daily as needed. For shortness of breath      . aspirin EC 81 MG tablet Take 81 mg by mouth daily.      [name] Kitchen glucose blood test strip Test twice daily.  100 each  12  . insulin detemir (LEVEMIR) 100 UNIT/ML injection Inject 74 Units into the skin at bedtime. Sliding scale  10 mL  3  . Insulin Pen Needle 32G X 4 MM MISC Use as directed.  100 each  12  . lamoTRIgine (LAMICTAL) 100 MG tablet Take 100 mg by mouth daily.       Letta Pate DELICA LANCETS FINE MISC 1 Device by Does not apply route 2 (two) times daily.  100 each  12  . sertraline (ZOLOFT) 25 MG tablet Take 25 mg by mouth daily.       . sitaGLIPtan-metformin (JANUMET) 50-1000 MG per tablet Take 1 tablet by mouth 2 (two) times daily with a meal.  60 tablet  3   No current facility-administered medications on file prior to visit.   Allergies  Allergen Reactions  . Amoxicillin Itching and Rash   Family History  Problem Relation Age of Onset  . Allergies Sister   . Allergies Daughter   . Allergies Daughter   . Asthma Daughter   . Asthma Daughter   . Arthritis Maternal Grandmother   . Hyperlipidemia Maternal Grandmother   . Hypertension Maternal Aunt   . Sudden death Mother   . Kidney failure Mother   . Sudden death Father   . Mental illness     Objective:   Physical Exam BP 134/88  Pulse 86  Temp(Src) 98.1 F (36.7 C) (Oral)  Resp 16  Ht 5\' 4"  (1.626 m)  Wt 205 lb (92.987 kg)  BMI 35.17 kg/m2  SpO2 97% Wt Readings from Last 3 Encounters:  02/12/13 205 lb (92.987 kg)  02/05/13 205 lb (92.987 kg)  10/21/12 198 lb (89.812 kg)  Constitutional: overweight, in NAD Eyes: PERRLA, EOMI, no exophthalmos ENT: moist mucous membranes, no thyromegaly, no cervical lymphadenopathy Cardiovascular: RRR, No MRG Respiratory: CTA B Gastrointestinal: abdomen soft, NT, ND, BS+ Musculoskeletal: no deformities, strength intact in all  4 Skin: moist, warm, no rashes Neurological: no tremor with outstretched hands, DTR normal in all 4  Assessment:     1. DM2, without apparent complications, uncontrolled, insulin-dependent    Plan:     1. Patient has uncontrolled DM2, on a fairly high dose of insulin daily, so we discussed that it is probably unlikely that she would be able to come off insulin completely, but decreasing the dose  would be a reasonable target. - For now, I would like to maintain her on her Janumet, and keep her on a stable dose of Levemir, 80 units nightly. I advised her not to vary the dose until I see her back. - I advised her to check her sugars at least twice a day, in a.m., and before meals or at bedtime, rotating check times - since she is interested in alternative medications for diabetes, I advised her to start cinnamon 1000 mg daily - I also suggested that she might try a VGo system and she appears very interested in this - the challenge is whether the maximum VGo reservoir of 40 units of insulin would be enough for her in the day - I discussed with Pincus Large with diabetes education; pt will be called with an appointment for training - I will see her back in 3 weeks with her log and hopefully by then she will have the VGo system in place - I strongly encouraged her to continue her healthy eating habits, and I offered her advice regarding food choices (also please see patient instruction section) - I offered her references for materials about a plant-based diet - I advised her to start a B complex vitamin  - I advised her to schedule a new ophthalmology appointment

## 2013-02-12 NOTE — Patient Instructions (Addendum)
Please take a B complex vitamin every day.  You can start taking Cinnamon 1000 mg daily. Please stay on Levemir 80 units and Janumet for now. DM education will call you with an appointment for the VGo system. Please return in 3 weeks for an appointment with me.  - substitute whole grain for white bread or pasta - substitute brown rice for white rice - substitute 90-calorie flat bread pieces for slices of bread when possible - substitute sweet potatoes or yams for white potatoes - substitute humus for margarine - substitute tofu for cheese when possible - substitute almond or rice milk for regular milk (would not drink soy milk daily due to concern for soy estrogen influence on breast cancer risk) - substitute dark chocolate for other sweets when possible - substitute water - can add lemon or orange slices for taste - for diet sodas (artificial sweeteners will trick your body that you can eat sweets without getting calories and will lead you to overeating and weight gain in the long run) - do not skip breakfast or other meals (this will slow down the metabolism and will result in more weight gain over time)  - can try smoothies made from fruit and almond/rice milk in am instead of regular breakfast - can also try old-fashioned (not instant) oatmeal made with almond/rice milk in am - order the dressing on the side when eating salad at a restaurant - eat as little meat as possible - can try juicing, but should not forget that juicing will get rid of the fiber, so would alternate with eating raw veg./fruits or drinking smoothies - use as little oil as possible, even when using olive oil - can dress a salad with a mix of balsamic vinegar and lemon juice, for e.g. - use agave nectar, stevia sugar, or regular sugar rather than artificial sweateners - steam or broil/roast veggies  - snack on veggies/fruit/nuts (unsalted, preferably) when possible, rather than processed foods - reduce or eliminate  aspartame in diet (it is in diet sodas, chewing gum, etc) Read the labels!  Please consider a plant-based diet. To help you with this, you can start by watching/reading the following: - lectures (you tube):  Lequita Asal: "Breaking the Food Seduction"  Doug Lisle: "How to Lose Weight, without Losing Your Mind"  Lucile Crater: "What is Insulin Resistance" [email or web address] - documentaries:  Supersize Me  [employer]  Forks over [employer], Sick and Nearly Dead  [employer] - books:  Lequita Asal: "Program for Reversing Diabetes"  Ferol Luz: "The [place] Study"  Konrad Penta: "Supermarket Vegan" (cookbook)      .

## 2013-02-12 NOTE — Progress Notes (Signed)
Labs from prior PCP scanned in. No rheum workup labs - but CK and ESR normal in 08/2011.

## 2013-02-15 ENCOUNTER — Encounter: Attending: Internal Medicine | Admitting: *Deleted

## 2013-02-15 NOTE — Progress Notes (Signed)
Introduction to Insulin Pump Therapy:  Appt start time: 1300 end time:  1430.  Assessment:  This patient has DM 2 and their primary concerns today: to start on V-Go Insulin Delivery System.   MEDICATIONS: Basal Insulin: 80 units of Levimir at night via pen Bolus Insulin: 0 units Total of insulin doses per day 80 units Other diabetes medications: Janumet  This patient is not currently adjusting bolus insulin based BG at a correction ratio  This patient is not currently adjusting bolus insulin based on carb intake at ratio   Patient states knowledge of Carb Counting is not yet assessed. Follow up visit planned for carb counting and further diabetes education.  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  V-Go Starter Kit and sample bottle of Humalog insulin was provided for the V-Go 40 which provides a constant basal rate of 1.67 units per hour for 24 hours a day and a total basal insulin dose of 40 units.  Since patient took her Levemir last night, we directed her to insert the first V-Go tomorrow AM by 8:00 so she can change it out each morning during her normal routine which was also her preference.  Patient also instructed to give a Correction Bolus of 1 unit/30 mg/dl above a target of 409 mg/dl. A correction insulin chart and Log Sheet was provided for patient to complete when they test pre-meal and at bedtime each day.   Patient will be contacted by Cristy Folks within 24 hours and again at 5 days to follow up with patient per V-Go company criteria  Patient to communicate with me  by Monday, 02/18/13 with BG data to assess BG control at current doses.   Handouts given during visit include:  V-Go Starter Kit  Follow up with [facility]  in 2 weeks for Carb Counting instruction and further BG data assessment.[name] Kitchen

## 2013-02-18 ENCOUNTER — Telehealth: Payer: Self-pay | Admitting: *Deleted

## 2013-02-20 ENCOUNTER — Other Ambulatory Visit: Payer: Self-pay | Admitting: Internal Medicine

## 2013-02-20 DIAGNOSIS — E1149 Type 2 diabetes mellitus with other diabetic neurological complication: Secondary | ICD-10-CM

## 2013-02-20 MED ORDER — V-GO 30 KIT: 1.0000 | PACK | Freq: Every day | Status: DC

## 2013-02-20 MED ORDER — INSULIN LISPRO 100 UNIT/ML ~~LOC~~ SOLN: 30.0000 [IU] | Freq: Three times a day (TID) | SUBCUTANEOUS | Status: DC

## 2013-03-04 ENCOUNTER — Encounter: Attending: Internal Medicine | Admitting: *Deleted

## 2013-03-04 DIAGNOSIS — Z713 Dietary counseling and surveillance: Secondary | ICD-10-CM | POA: Insufficient documentation

## 2013-03-04 DIAGNOSIS — E1149 Type 2 diabetes mellitus with other diabetic neurological complication: Secondary | ICD-10-CM | POA: Insufficient documentation

## 2013-03-04 NOTE — Progress Notes (Signed)
  Medical Nutrition Therapy:  Appt start time: 1630 end time:  1730.  Assessment:  Primary concerns today: patient here for follow up after starting on V-Go insulin delivery device almost 3 weeks ago and for further diabetes education. She is here with her husband and one of her grown daughters. Both appear very supportive. Patient continues to do very well with V-Go 30 insulin delivery device with reported BG's between 92 and 173 and most well under 150 mg/dl. She has not had to give any correction insulin yet, so is receiving 30 units / day of  Humalog via Basal delivery only. She states she has much more energy, is not hungry for food much anymore and has resumed exercising about an hour every day during the week.  MEDICATIONS: see list   DIETARY INTAKE:  Usual eating pattern includes 3 meals and 1-2 snacks per day.  Everyday foods include good variety of all food groups except for meat.  Avoided foods include meats, is vegetarian, sweets and sodas.    24-hr recall:  B ( AM): protein shake powder mixed with Almond Milk OR Miso soup OR grits x 1 cup OR unsweetened cereal with Almond Milk, water Snk ( AM): fresh fruit  L ( PM): salad with [place] meat, [place] dressing, water OR soup Snk ( PM): occasional Protein Bar D ( PM): vegetarian: pasta with tomato sauce OR [place] Salad etc Snk ( PM): not lately Beverages: water  Usual physical activity: Kick boxing 3 days a week for an hour, walks or bike riding on other days.,  Estimated energy needs: 1400 calories 158 g carbohydrates 105 g protein 39 g fat  Progress Towards Goal(s):  In progress.   Nutritional Diagnosis:  NB-1.1 Food and nutrition-related knowledge deficit As related to diabetes management.  As evidenced by A1c of 8.0 on 02/05/13.    Intervention:  Nutrition counseling and diabetes education initiated. Discussed basic physiology of diabetes, SMBG and rationale of checking BG at alternate times of day, A1c, Carb Counting and  reading food labels, and commended here on her increased activity level. She verbalized very good understanding of Carb Counting based on food groups and was excited to see information available from APP: Calorie Brooke Dare. Due to her improved BG management with V-Go 30 I would like to suggest that she move to the V-Go 20 with next Rx to allow for some Bolus insulin to be delivered at meal times with new understanding of Carb Counting or correction if needed. (the V-Go has to be completely filled so if she isn't using any Bolus insulin, it is discarded each day when changed out.)  Handouts given during visit include: Living Well with Diabetes Carb Counting and Food Label handouts Meal Plan Card  [employer] for phone  Monitoring/Evaluation:  Dietary intake, exercise, reading food labels, and body weight in 4 week(s).

## 2013-03-04 NOTE — Patient Instructions (Signed)
Plan:  Aim for 3 Carb Choices per meal (45 grams) +/- 1 either way  Aim for 0-1 Carbs per snack if hungry  This will provide you with about 1400 calories per day Consider reading food labels for Total Carbohydrate of foods Continue with your current activity level by kick boxing and walking  for 30-60 minutes daily as tolerated Continue checking BG at alternate times per day as directed by MD  Continue taking medication insulin via V-Go 30 as directed by MD

## 2013-03-06 ENCOUNTER — Ambulatory Visit: Admitting: Internal Medicine

## 2013-03-06 ENCOUNTER — Encounter: Admitting: Family Medicine

## 2013-03-06 DIAGNOSIS — Z0289 Encounter for other administrative examinations: Secondary | ICD-10-CM

## 2013-03-06 NOTE — Progress Notes (Signed)
No show  This encounter was created in error - please disregard.

## 2013-03-29 ENCOUNTER — Encounter: Payer: Self-pay | Admitting: Family Medicine

## 2013-03-29 NOTE — Progress Notes (Signed)
Received OV note from rheumatology (Dr. Dareen Piano) from 03/19/13. Polyarthralgia. Seronagative inflammatory arthritis of hand on [place]. Tx prednisone. ESR, CRP. CCP, RF ANA with reflex ordered. Has f/u in 2 weeks. Appreciate care.

## 2013-04-02 ENCOUNTER — Ambulatory Visit: Admitting: *Deleted

## 2013-04-08 ENCOUNTER — Encounter: Payer: Self-pay | Admitting: *Deleted

## 2013-04-08 ENCOUNTER — Telehealth: Payer: Self-pay | Admitting: Internal Medicine

## 2013-04-08 DIAGNOSIS — E1149 Type 2 diabetes mellitus with other diabetic neurological complication: Secondary | ICD-10-CM

## 2013-04-08 MED ORDER — INSULIN LISPRO 100 UNIT/ML ~~LOC~~ SOLN: 20.0000 [IU] | Freq: Three times a day (TID) | SUBCUTANEOUS | Status: DC

## 2013-04-08 MED ORDER — V-GO 20 KIT: 1.0000 | PACK | Freq: Every day | Status: DC

## 2013-04-08 NOTE — Telephone Encounter (Signed)
On last V-go, needs refill called in @ [facility] on [address]. Please call patient back to discuss dosing change before calling in refill  [phone]

## 2013-04-08 NOTE — Telephone Encounter (Signed)
Please read note below and advise.  

## 2013-04-10 ENCOUNTER — Encounter: Payer: Self-pay | Admitting: Family Medicine

## 2013-04-10 NOTE — Progress Notes (Signed)
Received OV note from Dr. Dareen Piano in Rheum from 03/27/13. Dx. Inflammatory arthritis. Decreased prednisone to 5mg  daily, added hydroxychloroquine, 2 tablets daily, she is to follow up with rheum in 2 months and get yearly retinal exams.

## 2013-04-12 ENCOUNTER — Ambulatory Visit (INDEPENDENT_AMBULATORY_CARE_PROVIDER_SITE_OTHER): Admitting: Internal Medicine

## 2013-04-12 ENCOUNTER — Encounter: Attending: Internal Medicine | Admitting: *Deleted

## 2013-04-12 ENCOUNTER — Encounter: Payer: Self-pay | Admitting: Internal Medicine

## 2013-04-12 ENCOUNTER — Encounter: Payer: Self-pay | Admitting: *Deleted

## 2013-04-12 VITALS — Wt 203.7 lb

## 2013-04-12 VITALS — BP 130/74 | HR 85 | Temp 98.2°F | Resp 10 | Wt 216.0 lb

## 2013-04-12 DIAGNOSIS — E1149 Type 2 diabetes mellitus with other diabetic neurological complication: Secondary | ICD-10-CM

## 2013-04-12 MED ORDER — INSULIN LISPRO 100 UNIT/ML ~~LOC~~ SOLN: 20.0000 [IU] | Freq: Three times a day (TID) | SUBCUTANEOUS | Status: DC

## 2013-04-12 NOTE — Progress Notes (Signed)
  Medical Nutrition Therapy:  Appt start time: 1200 end time:  1300.  Assessment:  Primary concerns today: patient here for follow up while being on V-Go insulin delivery device. She continues to be thrilled with the use of the V-Go and confirms that her Rx is now for the V-Go 20 starting today. She will now implement meal time insulin at 4 units at Breakfast and Lunch with 8 units before Supper. She does not have a Correction Dose yet. She expressed fair understanding of Carb Counting, needs further review. She continues with her exercise of kickboxing daily.  MEDICATIONS: see list. Humalog in V-Go insulin delivery device   DIETARY INTAKE:  Usual eating pattern includes 3 meals and 1-2 snacks per day.  Everyday foods include good variety of all food groups except for meat.  Avoided foods include meats- she is vegetarian, sweets and sodas.    24-hr recall:  B ( AM): protein shake powder mixed with Almond Milk OR Miso soup OR grits x 1 cup OR unsweetened cereal with Almond Milk, water Snk ( AM): fresh fruit  L ( PM): salad with [place] meat, [place] dressing, water OR soup Snk ( PM): occasional Protein Bar D ( PM): vegetarian: pasta with tomato sauce OR [place] Salad etc Snk ( PM): not lately Beverages: water  Usual physical activity: Kick boxing 3 days a week for an hour, walks or bike riding on other days.,  Estimated energy needs: 1400 calories 158 g carbohydrates 105 g protein 39 g fat  Progress Towards Goal(s):  In progress.   Nutritional Diagnosis:  NB-1.1 Food and nutrition-related knowledge deficit As related to diabetes management.  As evidenced by A1c of 8.0 on 02/05/13.    Intervention: Commended her on her continued improvement with her diabetes management. Discussed Carb Counting and reading food labels today and she demonstrated very good understanding by end of the visit. Also reviewed the difference between the basal delivery of the Humalog being non-food insulin and the  new bolus taking care of meals and potentially for correction doses. Also reminded her that each button push of V-Go will deliver 2 units of insulin.   Plan:  Aim for 3 Carb Choices per meal (45 grams) +/- 1 either way  Aim for 0-1 Carbs per snack if hungry  This will provide you with about 1400 calories per day Consider reading food labels for Total Carbohydrate of foods Continue with your current activity level by kick boxing and walking  for 30-60 minutes daily as tolerated Continue checking BG at alternate times per day as directed by MD  Continue taking medication insulin via V-Go 20 and add Bolus of 4 units Breakfast and Lunch, 8 units at supper as directed by MD REMEMBER that each time you push the V=Go button, you get 2 units of insulin.   Handouts given during visit include: Carb Counting and Food Label handouts Meal Plan Card  Monitoring/Evaluation:  Dietary intake, exercise, reading food labels, and body weight in 6 week(s).

## 2013-04-12 NOTE — Progress Notes (Signed)
Subjective:     Patient ID: Kerri Osborn, female   DOB: [birth year], 52 y.o.   MRN: [id number]  HPI Kerri Osborn is a 52 year old African American woman, referred by her PCP, Dr. Selena Batten, for management of DM2, dx. 2008, without complications, uncontrolled, insulin-dependent.  Pt was dx with DM2 2008. She was part of a a diabetic study and has started Janumet which she has been on since then. She started insulin 2 years ago. Her last A1c was 8.0% on 02/05/2013. At that time BUN/creatinine ratio was 14/0.6, and an albumin to creatinine ratio was 8.1.  Recently she was dx with RA by Dr. Dareen Piano (rheum.) and started on Prednisone 10 mg (03/19/2013), now decreased to 5 mg after 8 days. She is now on Plaquenil, too. She started bolusing with the V-Go on 03/19/2013.   [employer] does not cover the V-Go (240$/mo), but the V-[employer] company [employer]) covers it, with use of a coupon, so that she pays 50$/mo. She is still on the 30 reservoir, but will get the 20 today.  Does not bring a log, but did bring her meter: - am: 120-190 - before lunch: 140-160 - before dinner: 170-214 She boluses 8 units for dinner, none for b'fast and lunch.  She has hypoglycemia awareness at 60. No lows recently.  She is exercising frequently, doing kickboxing and having a personal trainer on Monday, Wednesday, and Friday. She is a vegetarian (started 2 years ago) but trying to get vegan.   Meals: - breakfast: grapefruit, yoghurt, grits  - lunch: salad + soy meats, noodles, miso soup, humus, veggies - dinner: largest meal of the day: soup + salad (+ dressing bought)  + soy meats - snacks: 2x a day - apple, protein shakes - whey proteins, humus, vegetables, almonds (not salted)  Last eye appt: >1 year. Reportedly, no DR. No CKD. She does have heel burning when touches the ground, no pain at night.  She also has a history of being bipolar, has rheumatoid arthritis, is overweight. She also has migraines, and asthma.   Review of  Systems Constitutional: no weight gain/loss, no fatigue, no subjective hyperthermia/hypothermia Eyes: no blurry vision, no xerophthalmia ENT: no sore throat, no nodules palpated in throat, no dysphagia/odynophagia, no hoarseness Cardiovascular: no CP/SOB/palpitations/leg swelling Respiratory: no cough/SOB Gastrointestinal: no N/V/D/C Musculoskeletal: no muscle/joint aches Skin: no rashes Neurological: no tremors/numbness/tingling/dizziness Psychiatric: no depression/anxiety  I reviewed pt's medications, allergies, PMH, social hx, family hx and no changes required, other than the addition of the VGo system and starting Prednison and Plaquenil.  Objective:   Physical Exam BP 130/74  Pulse 85  Temp(Src) 98.2 F (36.8 C) (Oral)  Resp 10  Wt 216 lb (97.977 kg)  BMI 37.06 kg/m2  SpO2 98% Wt Readings from Last 3 Encounters:  04/12/13 216 lb (97.977 kg)  02/12/13 205 lb (92.987 kg)  02/05/13 205 lb (92.987 kg)  Constitutional: overweight, in NAD Eyes: PERRLA, EOMI, no exophthalmos ENT: moist mucous membranes, no thyromegaly, no cervical lymphadenopathy Cardiovascular: RRR, No MRG Respiratory: CTA B Gastrointestinal: abdomen soft, NT, ND, BS+ Musculoskeletal: no deformities, strength intact in all 4  Assessment:     1. DM2, without apparent complications, uncontrolled, insulin-dependent    Plan:     1. Patient is doing much better after starting the VGo insulin pump, with most of her sugars at goal, per messages that I received from [facility] (diabetes education), however with recent increasing blood sugars due to starting the prednisone. Sugars improved recently after dropping  the prednisone dose from 10 mg to 5 mg, but they are still higher than before.  - She does not bring the sugar log, so it is hard to make decisions about her insulin doses, however, since she is not bolusing with breakfast and lunch and she has a staircase effect (increased sugars throughout the day),  will go ahead and add 4 units of insulin with breakfast and lunch, and will continue her 8 units with dinner. I will continue with the same basal rate for now, but she will soon change her VGo from the 30 to 20 units reservoir, and I think this basal rate might not be enough. I discussed with her about this, however she mentions that she would be taken off prednisone at the beginning of May, so she would still like to go ahead and refill the 20 units reservoir. - I advised her to send me sugars to my chart and we can make changes based on this - I would see her back in 3 months and I advised her not to forget her sugar log at that time - We'll check a hemoglobin A1c in 3 months

## 2013-04-12 NOTE — Patient Instructions (Signed)
Plan:  Aim for 3 Carb Choices per meal (45 grams) +/- 1 either way  Aim for 0-1 Carbs per snack if hungry  This will provide you with about 1400 calories per day Consider reading food labels for Total Carbohydrate of foods Continue with your current activity level by kick boxing and walking  for 30-60 minutes daily as tolerated Continue checking BG at alternate times per day as directed by MD  Continue taking medication insulin via V-Go 20 and add Bolus of 4 units Breakfast and Lunch, 8 units at supper as directed by MD REMEMBER that each time you push the V=Go button, you get 2 units of insulin.

## 2013-04-12 NOTE — Patient Instructions (Addendum)
Please join MyChart. Please come back in 3 months, do not forget your sugar log.

## 2013-04-15 ENCOUNTER — Ambulatory Visit: Admitting: *Deleted

## 2013-05-24 ENCOUNTER — Ambulatory Visit: Admitting: *Deleted

## 2013-05-29 ENCOUNTER — Ambulatory Visit: Admitting: *Deleted

## 2013-07-05 ENCOUNTER — Ambulatory Visit: Admitting: Internal Medicine

## 2013-07-22 ENCOUNTER — Ambulatory Visit: Admitting: Internal Medicine

## 2013-09-03 ENCOUNTER — Emergency Department (HOSPITAL_COMMUNITY)
Admission: EM | Admit: 2013-09-03 | Discharge: 2013-09-03 | Disposition: A | Discharge status: Home / Self Care | Attending: Emergency Medicine | Admitting: Emergency Medicine

## 2013-09-03 ENCOUNTER — Encounter (HOSPITAL_COMMUNITY): Payer: Self-pay

## 2013-09-03 DIAGNOSIS — Z8619 Personal history of other infectious and parasitic diseases: Secondary | ICD-10-CM | POA: Insufficient documentation

## 2013-09-03 DIAGNOSIS — F319 Bipolar disorder, unspecified: Secondary | ICD-10-CM | POA: Insufficient documentation

## 2013-09-03 DIAGNOSIS — F329 Major depressive disorder, single episode, unspecified: Secondary | ICD-10-CM | POA: Insufficient documentation

## 2013-09-03 DIAGNOSIS — G43909 Migraine, unspecified, not intractable, without status migrainosus: Secondary | ICD-10-CM

## 2013-09-03 DIAGNOSIS — E119 Type 2 diabetes mellitus without complications: Secondary | ICD-10-CM | POA: Insufficient documentation

## 2013-09-03 DIAGNOSIS — I2510 Atherosclerotic heart disease of native coronary artery without angina pectoris: Secondary | ICD-10-CM | POA: Insufficient documentation

## 2013-09-03 DIAGNOSIS — M542 Cervicalgia: Secondary | ICD-10-CM | POA: Insufficient documentation

## 2013-09-03 DIAGNOSIS — Z79899 Other long term (current) drug therapy: Secondary | ICD-10-CM | POA: Insufficient documentation

## 2013-09-03 DIAGNOSIS — Z794 Long term (current) use of insulin: Secondary | ICD-10-CM | POA: Insufficient documentation

## 2013-09-03 DIAGNOSIS — M069 Rheumatoid arthritis, unspecified: Secondary | ICD-10-CM

## 2013-09-03 DIAGNOSIS — Z880 Allergy status to penicillin: Secondary | ICD-10-CM | POA: Insufficient documentation

## 2013-09-03 DIAGNOSIS — Z7982 Long term (current) use of aspirin: Secondary | ICD-10-CM | POA: Insufficient documentation

## 2013-09-03 DIAGNOSIS — J45909 Unspecified asthma, uncomplicated: Secondary | ICD-10-CM | POA: Insufficient documentation

## 2013-09-03 DIAGNOSIS — F3289 Other specified depressive episodes: Secondary | ICD-10-CM | POA: Insufficient documentation

## 2013-09-03 MED ORDER — DIPHENHYDRAMINE HCL 50 MG/ML IJ SOLN
25.0000 mg | Freq: Once | INTRAMUSCULAR | Status: AC
  Administered 2013-09-03: 25 mg via INTRAVENOUS
  Filled 2013-09-03: qty 1

## 2013-09-03 MED ORDER — KETOROLAC TROMETHAMINE 30 MG/ML IJ SOLN
30.0000 mg | Freq: Once | INTRAMUSCULAR | Status: AC
  Administered 2013-09-03: 30 mg via INTRAVENOUS
  Filled 2013-09-03: qty 1

## 2013-09-03 MED ORDER — METOCLOPRAMIDE HCL 5 MG/ML IJ SOLN
10.0000 mg | Freq: Once | INTRAMUSCULAR | Status: AC
  Administered 2013-09-03: 10 mg via INTRAVENOUS
  Filled 2013-09-03: qty 2

## 2013-09-03 MED ORDER — HYDROCODONE-ACETAMINOPHEN 5-325 MG PO TABS
2.0000 | ORAL_TABLET | Freq: Once | ORAL | Status: AC
  Administered 2013-09-03: 2 via ORAL
  Filled 2013-09-03: qty 2

## 2013-09-03 MED ORDER — HYDROCODONE-ACETAMINOPHEN 5-325 MG PO TABS
1.0000 | ORAL_TABLET | Freq: Four times a day (QID) | ORAL | Status: DC | PRN
Start: 2013-09-03 — End: 2014-03-11

## 2013-09-03 NOTE — ED Notes (Signed)
Pt state she is a newly dx arthritis, c/o severe pain from neck to toes and migraine since last pm

## 2013-09-03 NOTE — ED Provider Notes (Signed)
CSN: [id number]     Arrival date & time 09/03/13  1347 History  This chart was scribed for non-physician practitioner, Magnus Sinning, PA-C working with Vida Roller, MD by Greggory Stallion, ED scribe. This patient was seen in room WTR7/WTR7 and the patient's care was started at 2:58 PM.   Chief Complaint  Patient presents with  . Arthritis   The history is provided by the patient. No language interpreter was used.    HPI Comments: Kerri Osborn is a 52 y.o. female with h/o rheumatoid arthritis who presents to the Emergency Department complaining of arthritis pain that started last night. She states she is having severe throbbing pain in all of her joints, neck pain and a migraine that started this morning. She was diagnosed with the arthritis 6 months ago. Pt has been taking Plaquenil daily for maintenance therapy.  She states that she has been compliant with the medication.   She reports that her headache today is similar to migraine headaches that she has had in the past.  She denies any head injury.  She has had associated nausea. Pt denies fever, neck stiffness, and visual disturbance as associated symptoms. She has tried to call her rheumatologist but can't get an appointment today.   PCP is Dr. Kriste Basque Rheumatologist is Dr. Dareen Piano  Past Medical History  Diagnosis Date  . Diabetes mellitus   . Bipolar disorder   . Asthma   . Coronary artery disease   . Arthritis   . Dyspnea 04/13/2011    pfts 03/2011:  Normal FEV1 and FEV1/FVC Ct chest 2012:  No PE, normal parenchyma Cleda Daub 04/18/2011 with active symptoms:  Normal FEV1%, mild decrease in FVC due to restriction vs airtrapping, normal appearing       flow volume loop.    . Depression   . Chicken pox   . Seasonal allergies   . Migraines   . 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 Abdominal hysterectomy    . Tubal ligation    . Appendectomy    . Myomectomy    .  Cesarean section    . Breast cyst excision    . Inner ear surgery      tubes in ears   Family History  Problem Relation Age of Onset  . Allergies Sister   . Allergies Daughter   . Allergies Daughter   . Asthma Daughter   . Asthma Daughter   . Arthritis Maternal Grandmother   . Hyperlipidemia Maternal Grandmother   . Hypertension Maternal Aunt   . Sudden death Mother   . Kidney failure Mother   . Sudden death Father   . Mental illness     History  Substance Use Topics  . Smoking status: Never Smoker   . Smokeless tobacco: Never Used  . Alcohol Use: No   OB History   Grav Para Term Preterm Abortions TAB SAB Ect Mult Living                 Review of Systems  Constitutional: Negative for fever.  HENT: Positive for neck pain.   Eyes: Negative for visual disturbance.  Gastrointestinal: Positive for nausea.  Musculoskeletal: Positive for arthralgias.  Neurological: Positive for headaches.  All other systems reviewed and are negative.    Allergies  Amoxicillin  Home Medications   Current Outpatient Rx  Name  Route  Sig  Dispense  Refill  . albuterol (PROVENTIL HFA) 108 (90 BASE) MCG/ACT inhaler   Inhalation   Inhale 2 puffs into the lungs 2 (two) times daily as needed. For shortness of breath         . aspirin EC 81 MG tablet   Oral   Take 81 mg by mouth daily.         . hydroxychloroquine (PLAQUENIL) 200 MG tablet   Oral   Take 400 mg by mouth daily.         . insulin lispro (HUMALOG) 100 UNIT/ML injection   Subcutaneous   Inject 4-8 Units into the skin 3 (three) times daily before meals.         . Insulin Pump Disposable (V-GO 20) KIT   Does not apply   1 Device by Does not apply route daily.   30 kit   3   . lamoTRIgine (LAMICTAL) 100 MG tablet   Oral   Take 200 mg by mouth daily.          . sertraline (ZOLOFT) 25 MG tablet   Oral   Take 25 mg by mouth daily.          . sitaGLIPtan-metformin (JANUMET) 50-1000 MG per tablet    Oral   Take 1 tablet by mouth 2 (two) times daily with a meal.   60 tablet   3    BP 148/76  Pulse 83  Temp(Src) 98.7 F (37.1 C) (Oral)  Resp 20  SpO2 98%  Physical Exam  Nursing note and vitals reviewed. Constitutional: She appears well-developed and well-nourished.  HENT:  Head: Normocephalic and atraumatic.  Mouth/Throat: Oropharynx is clear and moist.  Eyes: EOM are normal. Pupils are equal, round, and reactive to light.  Neck: Normal range of motion. Neck supple.  Full ROM  Cardiovascular: Normal rate, regular rhythm and normal heart sounds.   No murmur heard. Pulmonary/Chest: Effort normal and breath sounds normal. No respiratory distress. She has no wheezes. She has no rales.  Musculoskeletal: Normal range of motion.  Full ROM of all joints of both upper and lower extremities. No erythema, edema or warmth of the joints palpated.   Neurological: She is alert. She has normal strength. No sensory deficit. Coordination and gait normal.  Cranial nerves intact.   Skin: Skin is warm and dry.  Psychiatric: She has a normal mood and affect. Her behavior is normal.    ED Course  Procedures (including critical care time)  DIAGNOSTIC STUDIES: Oxygen Saturation is 98% on RA, normal by my interpretation.    COORDINATION OF CARE: 3:07 PM-Discussed treatment plan which includes IV medication for migraine and pain medication with pt at bedside and pt agreed to plan.   4:22 PM-Upon recheck, pt states her joint pain and migraine have both improved.   Labs Review Labs Reviewed - No data to display Imaging Review No results found.  MDM  No diagnosis found. Patient with a history of Rheumatoid Arthritis presents today with diffuse joint pain and a migraine headache.  No signs of infection of the joints.  Patient also with migraine headache, which she reports is similar to migraines that she has had in the past.  Normal neurological exam.  Headache improved after given migraine  cocktail.  Therefore, do not feel any imaging is indicated at this time.  Patient stable for discharge.  Patient instructed to follow up with her PCP and her Rheumatologist.  Return precautions given.  I personally performed the services described in  this documentation, which was scribed in my presence. The recorded information has been reviewed and is accurate.   Pascal Lux [place], PA-C 09/04/13 2044

## 2013-09-05 NOTE — ED Provider Notes (Signed)
Medical screening examination/treatment/procedure(s) were performed by non-physician practitioner and as supervising physician I was immediately available for consultation/collaboration.    Vida Roller, MD 09/05/13 2101

## 2013-10-15 ENCOUNTER — Other Ambulatory Visit: Payer: Self-pay | Admitting: Family Medicine

## 2013-11-18 LAB — HM DIABETES EYE EXAM

## 2013-11-28 ENCOUNTER — Encounter (HOSPITAL_COMMUNITY): Payer: Self-pay | Admitting: Emergency Medicine

## 2013-11-28 ENCOUNTER — Emergency Department (HOSPITAL_COMMUNITY)
Admission: EM | Admit: 2013-11-28 | Discharge: 2013-11-28 | Disposition: A | Discharge status: Home / Self Care | Attending: Emergency Medicine | Admitting: Emergency Medicine

## 2013-11-28 DIAGNOSIS — M2550 Pain in unspecified joint: Secondary | ICD-10-CM | POA: Insufficient documentation

## 2013-11-28 DIAGNOSIS — E119 Type 2 diabetes mellitus without complications: Secondary | ICD-10-CM | POA: Insufficient documentation

## 2013-11-28 DIAGNOSIS — Z7982 Long term (current) use of aspirin: Secondary | ICD-10-CM | POA: Insufficient documentation

## 2013-11-28 DIAGNOSIS — R209 Unspecified disturbances of skin sensation: Secondary | ICD-10-CM | POA: Insufficient documentation

## 2013-11-28 DIAGNOSIS — F319 Bipolar disorder, unspecified: Secondary | ICD-10-CM | POA: Insufficient documentation

## 2013-11-28 DIAGNOSIS — Z8619 Personal history of other infectious and parasitic diseases: Secondary | ICD-10-CM | POA: Insufficient documentation

## 2013-11-28 DIAGNOSIS — M069 Rheumatoid arthritis, unspecified: Secondary | ICD-10-CM | POA: Insufficient documentation

## 2013-11-28 DIAGNOSIS — J45909 Unspecified asthma, uncomplicated: Secondary | ICD-10-CM | POA: Insufficient documentation

## 2013-11-28 DIAGNOSIS — Z794 Long term (current) use of insulin: Secondary | ICD-10-CM | POA: Insufficient documentation

## 2013-11-28 DIAGNOSIS — I2510 Atherosclerotic heart disease of native coronary artery without angina pectoris: Secondary | ICD-10-CM | POA: Insufficient documentation

## 2013-11-28 DIAGNOSIS — Z79899 Other long term (current) drug therapy: Secondary | ICD-10-CM | POA: Insufficient documentation

## 2013-11-28 DIAGNOSIS — G43909 Migraine, unspecified, not intractable, without status migrainosus: Secondary | ICD-10-CM | POA: Insufficient documentation

## 2013-11-28 DIAGNOSIS — R350 Frequency of micturition: Secondary | ICD-10-CM | POA: Insufficient documentation

## 2013-11-28 MED ORDER — OXYCODONE-ACETAMINOPHEN 5-325 MG PO TABS
1.0000 | ORAL_TABLET | Freq: Once | ORAL | Status: AC
  Administered 2013-11-28: 1 via ORAL
  Filled 2013-11-28: qty 1

## 2013-11-28 MED ORDER — HYDROCODONE-ACETAMINOPHEN 5-325 MG PO TABS
1.0000 | ORAL_TABLET | Freq: Four times a day (QID) | ORAL | Status: DC | PRN
Start: 2013-11-28 — End: 2013-12-27

## 2013-11-28 MED ORDER — NAPROXEN 500 MG PO TABS: 500.0000 mg | ORAL_TABLET | Freq: Two times a day (BID) | ORAL | Status: DC

## 2013-11-28 MED ORDER — DEXAMETHASONE SODIUM PHOSPHATE 10 MG/ML IJ SOLN
10.0000 mg | Freq: Once | INTRAMUSCULAR | Status: AC
  Administered 2013-11-28: 10 mg via INTRAMUSCULAR
  Filled 2013-11-28: qty 1

## 2013-11-28 NOTE — ED Notes (Addendum)
Pt wants her CBG checked. CBG-328. Pt c/o frequent urination. Denies blurred vision.

## 2013-11-28 NOTE — ED Notes (Signed)
Pt c/o generalized body aches worse in hands x 2 days from RA

## 2013-11-28 NOTE — ED Provider Notes (Signed)
CSN: [id number]     Arrival date & time 11/28/13  1810 History  This chart was scribed for non-physician practitioner Jaynie Crumble, PA-C, working with Gerhard Munch, MD by Dorothey Baseman, ED Scribe. This patient was seen in room TR10C/TR10C and the patient's care was started at 6:55 PM.    Chief Complaint  Patient presents with  . Generalized Body Aches   The history is provided by the patient. No language interpreter was used.   HPI Comments: Kerri Osborn is a 52 y.o. Female with a history of rheumatoid arthritis who presents to the Emergency Department complaining of diffuse arthralgias and myalgias, worse in the bilateral hands, onset 3 days ago that has been progressively worsening. She states that her current pain feels similar to her past rheumatoid arthritis. She reports that the pain will intermittently shoot up the arm and the fingers will go numb, which is new for her. She reports that she attempted to follow up with her rheumatologist and PCP, but that they would not be able to see her until next month. She reports that she takes Plaquenil at home daily for her rheumatoid arthritis, but that she did not take it this morning. Patient denies taking any medications at home to treat her symptoms. She denies neck pain or fever. Patient also has a history of DM and CAD. Patient also states that she has eaten more sugar than usual today and did not take her DM medications this morning (insulin pump and Janumet). Patient also has a history of bipolar disorder and depression.   Patient also reports some urinary frequency. She denies dysuria.   Past Medical History  Diagnosis Date  . Diabetes mellitus   . Bipolar disorder   . Asthma   . Coronary artery disease   . Arthritis   . Dyspnea 04/13/2011    pfts 03/2011:  Normal FEV1 and FEV1/FVC Ct chest 2012:  No PE, normal parenchyma Cleda Daub 04/18/2011 with active symptoms:  Normal FEV1%, mild decrease in FVC due to restriction vs airtrapping,  normal appearing       flow volume loop.    . Depression   . Chicken pox   . Seasonal allergies   . Migraines   . 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 Abdominal hysterectomy    . Tubal ligation    . Appendectomy    . Myomectomy    . Cesarean section    . Breast cyst excision    . Inner ear surgery      tubes in ears   Family History  Problem Relation Age of Onset  . Allergies Sister   . Allergies Daughter   . Allergies Daughter   . Asthma Daughter   . Asthma Daughter   . Arthritis Maternal Grandmother   . Hyperlipidemia Maternal Grandmother   . Hypertension Maternal Aunt   . Sudden death Mother   . Kidney failure Mother   . Sudden death Father   . Mental illness     History  Substance Use Topics  . Smoking status: Never Smoker   . Smokeless tobacco: Never Used  . Alcohol Use: No   OB History   Grav Para Term Preterm Abortions TAB SAB Ect Mult Living                 Review of Systems  Constitutional: Negative for fever.  Genitourinary: Positive for frequency. Negative for  dysuria.  Musculoskeletal: Positive for arthralgias and myalgias. Negative for neck pain.  Neurological: Positive for numbness.  All other systems reviewed and are negative.    Allergies  Amoxicillin  Home Medications   Current Outpatient Rx  Name  Route  Sig  Dispense  Refill  . aspirin EC 81 MG tablet   Oral   Take 81 mg by mouth daily.         [name] Kitchen HYDROcodone-acetaminophen (NORCO/VICODIN) 5-325 MG per tablet   Oral   Take 1-2 tablets by mouth every 6 (six) hours as needed for pain.   20 tablet   0   . hydroxychloroquine (PLAQUENIL) 200 MG tablet   Oral   Take 400 mg by mouth daily.         . insulin lispro (HUMALOG) 100 UNIT/ML injection   Subcutaneous   Inject 4-8 Units into the skin 3 (three) times daily before meals.         . Insulin Pump Disposable (V-GO 20) KIT   Does not apply   1 Device by Does  not apply route daily.   30 kit   3   . lamoTRIgine (LAMICTAL) 100 MG tablet   Oral   Take 200 mg by mouth daily.          . sertraline (ZOLOFT) 25 MG tablet   Oral   Take 25 mg by mouth daily.          . sitaGLIPtan-metformin (JANUMET) 50-1000 MG per tablet   Oral   Take 1 tablet by mouth 2 (two) times daily with a meal.   60 tablet   3   . VENTOLIN HFA 108 (90 BASE) MCG/ACT inhaler      inhale 2 puffs by mouth every 6 hours if needed   18 g   0    Triage Vitals: BP 130/82  Pulse 87  Temp(Src) 98.2 F (36.8 C) (Oral)  Resp 18  Ht 5\' 3"  (1.6 m)  Wt 204 lb 11.2 oz (92.851 kg)  BMI 36.27 kg/m2  SpO2 98%  Physical Exam  Nursing note and vitals reviewed. Constitutional: She is oriented to person, place, and time. She appears well-developed and well-nourished. No distress.  HENT:  Head: Normocephalic and atraumatic.  Eyes: Conjunctivae are normal.  Neck: Normal range of motion. Neck supple.  Cardiovascular: Normal rate, regular rhythm and normal heart sounds.  Exam reveals no gallop and no friction rub.   No murmur heard. Pulmonary/Chest: Effort normal and breath sounds normal. No respiratory distress. She has no wheezes. She has no rales.  Abdominal: She exhibits no distension.  Musculoskeletal: Normal range of motion.  Tender over bilateral shoulder joints, elbow joints, wrist joints, all joints of the hands.   Neurological: She is alert and oriented to person, place, and time.  Normal strength against resistance of bilateral upper extremities, but with pain.   Skin: Skin is warm and dry.  Psychiatric: She has a normal mood and affect. Her behavior is normal.    ED Course  Procedures (including critical care time)  DIAGNOSTIC STUDIES: Oxygen Saturation is 98% on room air, normal by my interpretation.    COORDINATION OF CARE: 7:07 PM- Ordered a check of blood glucose. Will order an injection of Decadron and Percocet to manage symptoms. Will discharge  patient with pain medication. Discussed treatment plan with patient at bedside and patient verbalized agreement.   8:14 PM- Patient reports feeling much better and her pain is completely gone after receiving the  medications. Will discharge patient with Norco and Naprosyn to manage symptoms. Discussed treatment plan with patient at bedside and patient verbalized agreement.    Labs Review Labs Reviewed  GLUCOSE, CAPILLARY - Abnormal; Notable for the following:    Glucose-Capillary 328 (*)    All other components within normal limits   Imaging Review No results found.  EKG Interpretation   None       MDM   1. Polyarthralgia      Patient with generalized joint ache. On exam there is no swelling noted. She has pain with movement specifically of bilateral shoulders, elbows, wrists, hands. She does have history of RA. She's not taking any medications for pain at this time. She is on Plaquenil. She also states she has not taken her diabetes medication or insulin today. She did ask [place] to check her blood sugar in emergency department and it was 328. She stated that she will take her insulin and her other medications systems she gets home. She's afebrile. She is otherwise nontoxic appearing. I do not think there is any infection in her joints. She doesn't have any other systemic complaints. Will get her pain under control. She did receive a one-time Decadron shot in ED. The prednisone given for home given her she does have diabetes. Will start on NSAIDs and Norco for severe pain. Patient did receive 1 tablet of Percocet emergency dept. It took her pain completely away, but she was very sedated.    Filed Vitals:   11/28/13 1817 11/28/13 2023  BP: 130/82 165/90  Pulse: 87 80  Temp: 98.2 F (36.8 C) 97.5 F (36.4 C)  TempSrc: Oral Oral  Resp: 18 18  Height: 5\' 3"  (1.6 m)   Weight: 204 lb 11.2 oz (92.851 kg)   SpO2: 98% 95%     I personally performed the services described in this  documentation, which was scribed in my presence. The recorded information has been reviewed and is accurate.    Lottie Mussel, PA-C 11/28/13 2144

## 2013-11-29 NOTE — ED Provider Notes (Signed)
  Medical screening examination/treatment/procedure(s) were performed by non-physician practitioner and as supervising physician I was immediately available for consultation/collaboration.  EKG Interpretation   None          Gerhard Munch, MD 11/29/13 [phone]

## 2013-12-02 ENCOUNTER — Telehealth: Payer: Self-pay | Admitting: Family Medicine

## 2013-12-02 NOTE — Telephone Encounter (Signed)
Pt has bleeding from rectum. And stools are soft. Pain in rectum when she has BM. Pt would like to schedule a colonoscopy. Pt made appt for thurs if she needs. pls advise. Also transferred pt triage to speak w/ a nurse pls advise

## 2013-12-05 ENCOUNTER — Encounter: Payer: Self-pay | Admitting: Family Medicine

## 2013-12-05 ENCOUNTER — Ambulatory Visit (INDEPENDENT_AMBULATORY_CARE_PROVIDER_SITE_OTHER): Admitting: Family Medicine

## 2013-12-05 VITALS — BP 110/70 | Temp 98.6°F | Wt 201.0 lb

## 2013-12-05 DIAGNOSIS — K625 Hemorrhage of anus and rectum: Secondary | ICD-10-CM

## 2013-12-05 NOTE — Progress Notes (Signed)
Chief Complaint  Patient presents with  . Rectal Bleeding    HPI:  BRBPR: -started at least 6 months ago -was intermittent, but now with every BM for 3 months -has pain with BMs sometimes and hemorrhoids -now has bleeding with every bowel movement, on TP and on Stool -used hemorrhoid tx in the past, but not any more -denies: straining, melena, objects in rectum, vomiting, nausea, weight loss, fevers, chills, abd pain -wants to see gastroenterologist -denies the use of goodies, ibuprofen, asa, etc, not on prednisone any longer -she is sure it is coming from rectum and is NOT vaginal or urinary -reports she is going to follow up with Dr. Elvera Lennox about her diabetes which she reports is much better  ROS: See pertinent positives and negatives per HPI.  Past Medical History  Diagnosis Date  . Diabetes mellitus   . Bipolar disorder   . Asthma   . Coronary artery disease   . Arthritis   . Dyspnea 04/13/2011    pfts 03/2011:  Normal FEV1 and FEV1/FVC Ct chest 2012:  No PE, normal parenchyma Cleda Daub 04/18/2011 with active symptoms:  Normal FEV1%, mild decrease in FVC due to restriction vs airtrapping, normal appearing       flow volume loop.    . Depression   . Chicken pox   . Seasonal allergies   . Migraines   . 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 Abdominal hysterectomy    . Tubal ligation    . Appendectomy    . Myomectomy    . Cesarean section    . Breast cyst excision    . Inner ear surgery      tubes in ears    Family History  Problem Relation Age of Onset  . Allergies Sister   . Allergies Daughter   . Allergies Daughter   . Asthma Daughter   . Asthma Daughter   . Arthritis Maternal Grandmother   . Hyperlipidemia Maternal Grandmother   . Hypertension Maternal Aunt   . Sudden death Mother   . Kidney failure Mother   . Sudden death Father   . Mental illness      History   Social History  . Marital  Status: Married    Spouse Name: N/A    Number of Children: 2  . Years of Education: N/A   Occupational History  . massage therapist and Esthetician    Social History Main Topics  . Smoking status: Never Smoker   . Smokeless tobacco: Never Used  . Alcohol Use: No  . Drug Use: No  . Sexual Activity: None   Other Topics Concern  . 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      Spiritual Beliefs:       Lifestyle: MWF does kickboxing, vegetarian - trying to go vegan             Current outpatient prescriptions:APPLE CIDER VINEGAR PO, Take 1 tablet by mouth daily., Disp: , Rfl: ;  aspirin EC 81 MG tablet, Take 81 mg by mouth daily., Disp: , Rfl: ;  HYDROcodone-acetaminophen (NORCO) 5-325 MG per tablet, Take 1 tablet by mouth every 6 (six) hours as needed for moderate pain., Disp: 20 tablet, Rfl: 0 HYDROcodone-acetaminophen (NORCO/VICODIN) 5-325  MG per tablet, Take 1-2 tablets by mouth every 6 (six) hours as needed for pain., Disp: 20 tablet, Rfl: 0;  hydroxychloroquine (PLAQUENIL) 200 MG tablet, Take 400 mg by mouth daily., Disp: , Rfl: ;  Insulin Pump Disposable (V-GO 20) KIT, 1 Device by Does not apply route daily., Disp: 30 kit, Rfl: 3;  lamoTRIgine (LAMICTAL) 100 MG tablet, Take 200 mg by mouth daily. , Disp: , Rfl:  sertraline (ZOLOFT) 25 MG tablet, Take 25 mg by mouth daily. , Disp: , Rfl: ;  sitaGLIPtan-metformin (JANUMET) 50-1000 MG per tablet, Take 1 tablet by mouth 2 (two) times daily with a meal., Disp: 60 tablet, Rfl: 3;  VENTOLIN HFA 108 (90 BASE) MCG/ACT inhaler, inhale 2 puffs by mouth every 6 hours if needed, Disp: 18 g, Rfl: 0;  naproxen (NAPROSYN) 500 MG tablet, Take 1 tablet (500 mg total) by mouth 2 (two) times daily., Disp: 30 tablet, Rfl: 0  EXAM:  Filed Vitals:   12/05/13 1249  BP: 110/70  Temp: 98.6 F (37 C)    Body mass index is 35.61  kg/(m^2).  GENERAL: vitals reviewed and listed above, alert, oriented, appears well hydrated and in no acute distress  HEENT: atraumatic, conjunttiva clear, no obvious abnormalities on inspection of external nose and ears  NECK: no obvious masses on inspection  ABD: BS+, soft, NTTP  RECTAL: no external lesions noted, on DRE not lesions palpated, no blood on exam glove, hemoccult negative  MS: moves all extremities without noticeable abnormality  PSYCH: pleasant and cooperative, no obvious depression or anxiety  ASSESSMENT AND PLAN:  Discussed the following assessment and plan:  Rectal bleeding - Plan: POCT hemoglobin, Ambulatory referral to Gastroenterology  -Hgb fine (14.5), virals normal, exam and hemoccult neg - offered pelvic exam but she deferred as she is sure this bleeding was rectal -we discussed possible serious and likely etiologies, workup and treatment, treatment risks and return precautions -after this discussion, Legaci opted for referral to GI -of course, we advised Faline  to return or notify a doctor immediately if symptoms worsen or persist or new concerns arise. -she was advised to schedule follow up for her diabetes  Patient Instructions  --We placed a referral for you as discussed to the gastroenterologist for your bleeding. It usually takes about 1-2 weeks to process and schedule this referral. If you have not heard from [place] regarding this appointment in 2 weeks please contact our office.  -please follow up with Dr. Elvera Lennox regarding your diabetes  -seek care immediately if large amount of bleeding or worsening bleeding      Ema Hebner R.

## 2013-12-05 NOTE — Patient Instructions (Addendum)
--  We placed a referral for you as discussed to the gastroenterologist for your bleeding. It usually takes about 1-2 weeks to process and schedule this referral. If you have not heard from [place] regarding this appointment in 2 weeks please contact our office.  -please follow up with Dr. Elvera Lennox regarding your diabetes  -seek care immediately if large amount of bleeding or worsening bleeding

## 2013-12-05 NOTE — Progress Notes (Signed)
Pre visit review using our clinic review tool, if applicable. No additional management support is needed unless otherwise documented below in the visit note. 

## 2013-12-24 ENCOUNTER — Encounter: Payer: Self-pay | Admitting: Internal Medicine

## 2013-12-27 ENCOUNTER — Telehealth: Payer: Self-pay | Admitting: *Deleted

## 2013-12-27 ENCOUNTER — Encounter: Payer: Self-pay | Admitting: Internal Medicine

## 2013-12-27 ENCOUNTER — Ambulatory Visit (INDEPENDENT_AMBULATORY_CARE_PROVIDER_SITE_OTHER): Admitting: Internal Medicine

## 2013-12-27 VITALS — BP 130/90 | HR 88 | Ht 63.0 in | Wt 203.4 lb

## 2013-12-27 DIAGNOSIS — K625 Hemorrhage of anus and rectum: Secondary | ICD-10-CM

## 2013-12-27 MED ORDER — MOVIPREP 100 G PO SOLR: 1.0000 | Freq: Once | ORAL | Status: DC

## 2013-12-27 NOTE — Telephone Encounter (Signed)
Dear Kerri Osborn, I have not seen the pt since 03/2013, but if she is on the same settings on her V-Go pump, I would likely only keep the basal rate going the day before the colonoscopy (only bolus if she drinks something sweet). During the colonoscopy procedure, it is OK to keep the VGo on and the basal rate going, and she can start bolusing when she starts eating again after the procedure. Please let me know if you have any questions. Sincerely, Philemon Kingdom MD

## 2013-12-27 NOTE — Telephone Encounter (Signed)
Left message for patient to call back  

## 2013-12-27 NOTE — Telephone Encounter (Signed)
Date: December 27, 2013  Re: Kerri Osborn DOB: [birth year] MRN: [id number]  Dear Dr. Cruzita Lederer :  Dr. Delfin Edis has scheduled the above patient for a colonoscopy at [facility] on 01/31/14.  Our records show that she is on insulin therapy via an insulin pump.  Our colonoscopy prep protocol requires that:  the patient must be on a clear liquid diet the entire day prior to the procedure date as well as the morning of the procedure  the patient must be NPO for 3 hours prior to the procedure   the patient must consume a PEG 3350 solution to prepare for the procedure.  Please advise [place] of any adjustments that need to be made to the patient's insulin pump therapy prior to the above procedure date.    Please route your recommendation to Dixon Boos, CMA  Thank you for your help with this matter.  Sincerely,  Dixon Boos

## 2013-12-27 NOTE — Progress Notes (Signed)
Kerri Osborn 1961/10/24 [id number]   History of Present Illness:  This is a 53 year old African American female with painless rectal bleeding of low volume which has been occurring for past 6 months. The last episode was in mid December 2014. She has tried over-the-counter preparation H with modest response.. Patient denies history of hemorrhoids. Her bowel habits are regular. She denies straining or constipation. Her hemoglobin was 14.5. She has never had a colonoscopy.She is on a vegan diet.    Past Medical History  Diagnosis Date  . Diabetes mellitus   . Bipolar disorder   . Asthma   . Coronary artery disease   . Arthritis   . Dyspnea 04/13/2011    pfts 03/2011:  Normal FEV1 and FEV1/FVC Ct chest 2012:  No PE, normal parenchyma [name] 04/18/2011 with active symptoms:  Normal FEV1%, mild decrease in FVC due to restriction vs airtrapping, normal appearing       flow volume loop.    . Depression   . Chicken pox   . Seasonal allergies   . Migraines   . 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 Abdominal hysterectomy    . Tubal ligation    . Appendectomy    . Myomectomy    . Cesarean section    . Breast cyst excision    . Inner ear surgery      tubes in ears    Allergies  Allergen Reactions  . Amoxicillin Itching and Rash    Family history and social history have been reviewed.  Review of Systems:   The remainder of the 10 point ROS is negative except as outlined in the H&P  Physical Exam: General Appearance Well developed, in no distress Eyes  Non icteric  HEENT  Non traumatic, normocephalic  Mouth No lesion, tongue papillated, no cheilosis Neck Supple without adenopathy, thyroid not enlarged, no carotid bruits, no JVD Lungs Clear to auscultation bilaterally COR Normal S1, normal S2, regular rhythm, no murmur, quiet precordium Abdomen soft mildly obese abdomen. Nontender. Liver edge at costal margin.  Insulin pump in the right lower quadrant Rectal and anoscopic exam reveals normal perianal area. Normal rectal sphincter tone. First-grade hemorrhoids. No thrombosis or bleeding. Stool is Hemoccult negative Extremities  No pedal edema Skin No lesions Neurological Alert and oriented x 3 Psychological Normal mood and affect  Assessment and Plan:   Problem #19 53 year old [place] American female with painless low-volume rectal bleeding which initially responded to over-the-counter preparation H. but has recurred. She has no family history of colon cancer. She is concerned about the possibility of cancer. We will schedule her for a colonoscopy since she just turned 53 years old and is a candidate for screening colonoscopy. She will adjust her insulin during the prep for colonoscopy.I will hold off on any specific therapy pending results of  Colonoscopy.    Delfin Edis 12/27/2013

## 2013-12-27 NOTE — Patient Instructions (Addendum)
You have been scheduled for a colonoscopy with propofol. Please follow written instructions given to you at your visit today.  Please pick up your prep kit at the pharmacy within the next 1-3 days. If you use inhalers (even only as needed), please bring them with you on the day of your procedure. Your physician has requested that you go to www.startemmi.com and enter the access code given to you at your visit today. This web site gives a general overview about your procedure. However, you should still follow specific instructions given to you by our office regarding your preparation for the procedure.  CC :Dr Maudie Mercury, Dr Cruzita Lederer

## 2013-12-30 NOTE — Telephone Encounter (Signed)
I have spoken to patient and have given Dr Arman Filter recommendations regarding insulin pump instructions for procedures. Patient verbalizes understanding and has requested a copy of Dr Arman Filter recommendations. Copy mailed to patient and she has been asked to call with any further questions or concerns.

## 2014-01-29 ENCOUNTER — Telehealth: Payer: Self-pay | Admitting: Internal Medicine

## 2014-01-29 NOTE — Telephone Encounter (Signed)
Left a message for patient to call me. Instructions from  Dr. Cruzita Lederer printed.

## 2014-01-30 NOTE — Telephone Encounter (Signed)
Left message for patient to call me

## 2014-01-30 NOTE — Telephone Encounter (Signed)
Left message for patient to call me again. 

## 2014-01-30 NOTE — Telephone Encounter (Signed)
Spoke with patient and she states she misplaced all of her instructions. She has eaten bread and noodles today. Instructed patient not to eat solids for the rest of today. Faxed instructions and insulin pump instructions to patient at [phone]

## 2014-01-31 ENCOUNTER — Encounter: Payer: Self-pay | Admitting: Internal Medicine

## 2014-01-31 ENCOUNTER — Ambulatory Visit (AMBULATORY_SURGERY_CENTER): Admitting: Internal Medicine

## 2014-01-31 VITALS — BP 125/88 | HR 72 | Temp 97.7°F | Resp 14 | Ht 63.0 in | Wt 203.0 lb

## 2014-01-31 DIAGNOSIS — D126 Benign neoplasm of colon, unspecified: Secondary | ICD-10-CM

## 2014-01-31 DIAGNOSIS — K625 Hemorrhage of anus and rectum: Secondary | ICD-10-CM

## 2014-01-31 LAB — GLUCOSE, CAPILLARY
Glucose-Capillary: 96 mg/dL (ref 70–99)
Glucose-Capillary: 82 mg/dL (ref 70–99)
Glucose-Capillary: 86 mg/dL (ref 70–99)

## 2014-01-31 MED ORDER — SODIUM CHLORIDE 0.9 % IV SOLN: 500.0000 mL | INTRAVENOUS | Status: DC

## 2014-01-31 NOTE — Patient Instructions (Signed)
YOU HAD AN ENDOSCOPIC PROCEDURE TODAY AT THE [facility]: Refer to the procedure report that was given to you for any specific questions about what was found during the examination.  If the procedure report does not answer your questions, please call your gastroenterologist to clarify.  If you requested that your care partner not be given the details of your procedure findings, then the procedure report has been included in a sealed envelope for you to review at your convenience later.  YOU SHOULD EXPECT: Some feelings of bloating in the abdomen. Passage of more gas than usual.  Walking can help get rid of the air that was put into your GI tract during the procedure and reduce the bloating. If you had a lower endoscopy (such as a colonoscopy or flexible sigmoidoscopy) you may notice spotting of blood in your stool or on the toilet paper. If you underwent a bowel prep for your procedure, then you may not have a normal bowel movement for a few days.  DIET: Your first meal following the procedure should be a light meal and then it is ok to progress to your normal diet.  A half-sandwich or bowl of soup is an example of a good first meal.  Heavy or fried foods are harder to digest and may make you feel nauseous or bloated.  Likewise meals heavy in dairy and vegetables can cause extra gas to form and this can also increase the bloating.  Drink plenty of fluids but you should avoid alcoholic beverages for 24 hours.  ACTIVITY: Your care partner should take you home directly after the procedure.  You should plan to take it easy, moving slowly for the rest of the day.  You can resume normal activity the day after the procedure however you should NOT DRIVE or use heavy machinery for 24 hours (because of the sedation medicines used during the test).    SYMPTOMS TO REPORT IMMEDIATELY: A gastroenterologist can be reached at any hour.  During normal business hours, 8:30 AM to 5:00 PM Monday through Friday,  call [phone].  After hours and on weekends, please call the GI answering service at [phone] who will take a message and have the physician on call contact you.   Following lower endoscopy (colonoscopy or flexible sigmoidoscopy):  Excessive amounts of blood in the stool  Significant tenderness or worsening of abdominal pains  Swelling of the abdomen that is new, acute  Fever of 100F or higher    FOLLOW UP: If any biopsies were taken you will be contacted by phone or by letter within the next 1-3 weeks.  Call your gastroenterologist if you have not heard about the biopsies in 3 weeks.  Our staff will call the home number listed on your records the next business day following your procedure to check on you and address any questions or concerns that you may have at that time regarding the information given to you following your procedure. This is a courtesy call and so if there is no answer at the home number and we have not heard from you through the emergency physician on call, we will assume that you have returned to your regular daily activities without incident.  SIGNATURES/CONFIDENTIALITY: You and/or your care partner have signed paperwork which will be entered into your electronic medical record.  These signatures attest to the fact that that the information above on your After Visit Summary has been reviewed and is understood.  Full responsibility of the confidentiality   of this discharge information lies with you and/or your care-partner.  Polyp, diverticulosis and high fiber diet information given.  Dr. Olevia Perches will advise in a letter when to schedule next colonscopy.

## 2014-01-31 NOTE — Progress Notes (Signed)
Procedure ends, to recovery, report given and VSS. 

## 2014-01-31 NOTE — Progress Notes (Signed)
Called to room to assist during endoscopic procedure.  Patient ID and intended procedure confirmed with present staff. Received instructions for my participation in the procedure from the performing physician.  

## 2014-01-31 NOTE — Op Note (Signed)
[place]  [address] [place], [address]   COLONOSCOPY PROCEDURE REPORT  PATIENT: Kerri Osborn, Kerri Osborn  MR#: [id number] BIRTHDATE: 1961-08-23 , 52  yrs. old GENDER: Female ENDOSCOPIST: Lafayette Dragon, MD REFERRED QB:[id number] Maudie Mercury, [place]:  01/31/2014 PROCEDURE:   Colonoscopy with snare polypectomy First Screening Colonoscopy - Avg.  risk and is 50 yrs.  old or older Yes.  Prior Negative Screening - Now for repeat screening. N/A  History of Adenoma - Now for follow-up colonoscopy & has been > or = to 3 yrs.  N/A  Polyps Removed Today? Yes. ASA CLASS:   Class II INDICATIONS:Average risk patient for colon cancer. MEDICATIONS: MAC sedation, administered by CRNA and Propofol (Diprivan) 260 mg IV  DESCRIPTION OF PROCEDURE:   After the risks benefits and alternatives of the procedure were thoroughly explained, informed consent was obtained.  A digital rectal exam revealed no abnormalities of the rectum.   The LB [id number]  endoscope was introduced through the anus and advanced to the cecum, which was identified by both the appendix and ileocecal valve. No adverse events experienced.   The quality of the prep was good, using MoviPrep  The instrument was then slowly withdrawn as the colon was fully examined.      COLON FINDINGS: A smooth sessile polyp ranging between 5-48mm in size was found in the ascending colon.  A polypectomy was performed with a cold snare and with cold forceps.  The resection was complete and the polyp tissue was completely retrieved.   Mild diverticulosis was noted in the sigmoid colon.  Retroflexed views revealed no abnormalities. The time to cecum=minutes 0 seconds.  Withdrawal time=12 minutes 21 seconds.  The scope was withdrawn and the procedure completed. COMPLICATIONS: There were no complications.  ENDOSCOPIC IMPRESSION: 1.   Sessile polyp ranging between 5-47mm in size was found in the ascending colon; polypectomy was  performed with a cold snare and with cold forceps 2.   Mild diverticulosis was noted in the sigmoid colon  RECOMMENDATIONS: 1.  Await pathology results 2.  high-fiber diet Call colonoscopy pending biopsies   eSigned:  Lafayette Dragon, MD 01/31/2014 11:37 AM   cc:   PATIENT NAME:  [name], [name] MR#: [id number]

## 2014-01-31 NOTE — Progress Notes (Addendum)
PT HAS INSULIN PUMP TO HER LEFT LOWER ABDOMEN!!!!!!!!!!!!!!!!!! NO EGG OR SOY ALLERGY. EWM NO PROBLEMS WITH PAST SEDATION. EWM

## 2014-02-03 ENCOUNTER — Telehealth: Payer: Self-pay | Admitting: *Deleted

## 2014-02-03 NOTE — Telephone Encounter (Signed)
  Follow up Call-  Call back number 01/31/2014  Post procedure Call Back phone  # [phone]  Permission to leave phone message Yes     Patient questions:  Do you have a fever, pain , or abdominal swelling? no Pain Score  0 *  Have you tolerated food without any problems? yes  Have you been able to return to your normal activities? yes  Do you have any questions about your discharge instructions: Diet   no Medications  no Follow up visit  no  Do you have questions or concerns about your Care? no  Actions: * If pain score is 4 or above: No action needed, pain <4.

## 2014-02-04 ENCOUNTER — Encounter: Payer: Self-pay | Admitting: Internal Medicine

## 2014-02-13 ENCOUNTER — Other Ambulatory Visit: Payer: Self-pay | Admitting: Family Medicine

## 2014-02-14 NOTE — Telephone Encounter (Signed)
Needs appt for further refills.

## 2014-03-04 ENCOUNTER — Other Ambulatory Visit: Payer: Self-pay | Admitting: Family Medicine

## 2014-03-04 NOTE — Telephone Encounter (Signed)
Pt is waiting phar

## 2014-03-11 ENCOUNTER — Ambulatory Visit (INDEPENDENT_AMBULATORY_CARE_PROVIDER_SITE_OTHER): Admitting: Family Medicine

## 2014-03-11 ENCOUNTER — Ambulatory Visit (INDEPENDENT_AMBULATORY_CARE_PROVIDER_SITE_OTHER)
Admission: RE | Admit: 2014-03-11 | Discharge: 2014-03-11 | Disposition: A | Discharge status: Home / Self Care | Source: Ambulatory Visit | Attending: Family Medicine | Admitting: Family Medicine

## 2014-03-11 ENCOUNTER — Telehealth: Payer: Self-pay | Admitting: Family Medicine

## 2014-03-11 ENCOUNTER — Encounter: Payer: Self-pay | Admitting: Family Medicine

## 2014-03-11 VITALS — BP 120/76 | Temp 97.8°F | Wt 200.0 lb

## 2014-03-11 DIAGNOSIS — M79609 Pain in unspecified limb: Secondary | ICD-10-CM

## 2014-03-11 DIAGNOSIS — M79603 Pain in arm, unspecified: Secondary | ICD-10-CM

## 2014-03-11 DIAGNOSIS — E119 Type 2 diabetes mellitus without complications: Secondary | ICD-10-CM

## 2014-03-11 DIAGNOSIS — M79606 Pain in leg, unspecified: Secondary | ICD-10-CM

## 2014-03-11 NOTE — Progress Notes (Signed)
Chief Complaint  Patient presents with  . Arm Pain  . Leg Pain    HPI:  R arm pain: -sees Dr. Anderson for arthritis -started about 5 days ago -can not think of inciting even -R forearm, sore to touch -warm compress did not help -denies: weakness, numbness, fevers, malaise  R leg Pain: -hit leg on a cart about 4 days ago -did not fall, no problems with bearing weight, did not bruise  DM: -had hgba1c checked at [facility] last week and is 7.4, reports cholesterol was good -has been taking care of sister whom got run over by a dump truck and had leg amputation -still using the v-go and taking janumet -keeping log, ave fasting BS 145, low of 93, high of 372 (a few month ago) -diet is vegetarian, walking -eye exam: end of last year -has appt with diabetes educator  ROS: See pertinent positives and negatives per HPI.  Past Medical History  Diagnosis Date  . Diabetes mellitus   . Bipolar disorder   . Asthma   . Coronary artery disease   . Arthritis   . Dyspnea 04/13/2011    pfts 03/2011:  Normal FEV1 and FEV1/FVC Ct chest 2012:  No PE, normal parenchyma Spiro 04/18/2011 with active symptoms:  Normal FEV1%, mild decrease in FVC due to restriction vs airtrapping, normal appearing       flow volume loop.    . Depression   . Chicken pox   . Seasonal allergies   . Migraines   . 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 Allergy   . Myocardial infarction     Past Surgical History  Procedure Laterality Date  . Abdominal hysterectomy    . Tubal ligation    . Appendectomy    . Myomectomy    . Cesarean section    . Breast cyst excision    . Inner ear surgery      tubes in ears    Family History  Problem Relation Age of Onset  . Allergies Sister   . Allergies Daughter   . Allergies Daughter   . Asthma Daughter   . Asthma Daughter   . Arthritis Maternal Grandmother   . Hyperlipidemia Maternal Grandmother   . Diabetes Maternal Grandmother   . Hypertension  Maternal Aunt   . Sudden death Mother   . Kidney failure Mother   . Sudden death Father   . Mental illness    . Colon cancer Neg Hx   . Rectal cancer Neg Hx   . Stomach cancer Neg Hx     History   Social History  . Marital Status: Married    Spouse Name: N/A    Number of Children: 2  . Years of Education: N/A   Occupational History  . massage therapist and Esthetician    Social History Main Topics  . Smoking status: Never Smoker   . Smokeless tobacco: Never Used  . Alcohol Use: No  . Drug Use: No  . Sexual Activity: None   Other Topics Concern  . 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      Spiritual Beliefs:       Lifestyle: MWF does kickboxing, vegetarian - trying to go vegan               Current outpatient prescriptions:hydroxychloroquine (PLAQUENIL) 200 MG tablet, Take 400 mg by mouth daily., Disp: , Rfl: ;  Insulin Pump Disposable (V-GO 20) KIT, 1 Device by Does not apply route daily., Disp: 30 kit, Rfl: 3;  JANUMET 50-1000 MG per tablet, take 1 tablet by mouth twice a day with meals, Disp: 60 tablet, Rfl: 0;  lamoTRIgine (LAMICTAL) 100 MG tablet, Take 200 mg by mouth daily. , Disp: , Rfl:  ONETOUCH VERIO test strip, TEST twice a day, Disp: 100 each, Rfl: 10;  sertraline (ZOLOFT) 25 MG tablet, Take 25 mg by mouth daily. , Disp: , Rfl: ;  VENTOLIN HFA 108 (90 BASE) MCG/ACT inhaler, inhale 2 puffs by mouth every 6 hours if needed, Disp: 18 g, Rfl: 0  EXAM:  Filed Vitals:   03/11/14 0855  BP: 120/76  Temp: 97.8 F (36.6 C)    Body mass index is 35.44 kg/(m^2).  GENERAL: vitals reviewed and listed above, alert, oriented, appears well hydrated and in no acute distress  HEENT: atraumatic, conjunttiva clear, no obvious abnormalities on inspection of external nose and ears  NECK: no obvious masses on inspection  LUNGS: clear to auscultation bilaterally,  no wheezes, rales or rhonchi, good air movement  CV: HRRR, no peripheral edema  MS: moves all extremities without noticeable abnormality -normal gait -TTP R forearm flexors, no weakness, normal sensation -TTP L lower R leg, small hematoma, normal strength and sensation and NV intact distally  PSYCH: pleasant and cooperative, no obvious depression or anxiety  ASSESSMENT AND PLAN:  Discussed the following assessment and plan:  Leg pain - Plan: DG Tibia/Fibula Right  Arm pain  Diabetes mellitus  -soft tissue contusion of leg likely, plain film to exclude fib fx less likely -forearm strain, supportive care and HEP - follow up 1 month -needs physical - pt to schedule -advised to schedule follow up for her diabetes  -Patient advised to return or notify a doctor immediately if symptoms worsen or persist or new concerns arise.  Patient Instructions  -schedule follow up with Dr. Cruzita Lederer  -Please bring a copy of your recent labs to our office  -schedule physical  -for arm - heat for 15 minutes twice daily, tylenol 500-1058m up to 3 times daily as needed, exercises 4 times per week - follow u in 1 month   -for leg get plain films and ice for 15 minutes twice daily  -follow up for physical exam and leg an darm issues in about 1 month     KIM, HANNAH R.

## 2014-03-11 NOTE — Patient Instructions (Signed)
-  schedule follow up with Dr. Cruzita Lederer  -Please bring a copy of your recent labs to our office  -schedule physical  -for arm - heat for 15 minutes twice daily, tylenol 500-1000mg  up to 3 times daily as needed, exercises 4 times per week - follow u in 1 month   -for leg get plain films and ice for 15 minutes twice daily  -follow up for physical exam and leg an darm issues in about 1 month

## 2014-03-11 NOTE — Telephone Encounter (Signed)
Spoke with pt and pt is aware xray is normal. See result note.

## 2014-03-11 NOTE — Telephone Encounter (Signed)
Pt following up on xray results

## 2014-03-11 NOTE — Progress Notes (Signed)
Pre visit review using our clinic review tool, if applicable. No additional management support is needed unless otherwise documented below in the visit note. 

## 2014-03-30 ENCOUNTER — Other Ambulatory Visit: Payer: Self-pay | Admitting: Family Medicine

## 2014-04-10 ENCOUNTER — Other Ambulatory Visit: Payer: Self-pay | Admitting: *Deleted

## 2014-04-10 MED ORDER — V-GO 20 KIT: 1.0000 | PACK | Freq: Every day | Status: DC

## 2014-04-10 NOTE — Telephone Encounter (Signed)
Rx for Vgo was sent to [facility] on [address], needs to go to [employer]. Done.

## 2014-04-15 ENCOUNTER — Encounter: Payer: Self-pay | Admitting: Family Medicine

## 2014-04-15 ENCOUNTER — Ambulatory Visit (INDEPENDENT_AMBULATORY_CARE_PROVIDER_SITE_OTHER): Admitting: Family Medicine

## 2014-04-15 ENCOUNTER — Encounter: Admitting: Family Medicine

## 2014-04-15 VITALS — BP 116/82 | Temp 98.3°F | Ht 64.75 in | Wt 201.0 lb

## 2014-04-15 DIAGNOSIS — E1149 Type 2 diabetes mellitus with other diabetic neurological complication: Secondary | ICD-10-CM

## 2014-04-15 DIAGNOSIS — N951 Menopausal and female climacteric states: Secondary | ICD-10-CM

## 2014-04-15 DIAGNOSIS — E785 Hyperlipidemia, unspecified: Secondary | ICD-10-CM

## 2014-04-15 DIAGNOSIS — Z0000 Encounter for general adult medical examination without abnormal findings: Secondary | ICD-10-CM

## 2014-04-15 DIAGNOSIS — L909 Atrophic disorder of skin, unspecified: Secondary | ICD-10-CM

## 2014-04-15 DIAGNOSIS — L919 Hypertrophic disorder of the skin, unspecified: Secondary | ICD-10-CM

## 2014-04-15 DIAGNOSIS — E669 Obesity, unspecified: Secondary | ICD-10-CM

## 2014-04-15 DIAGNOSIS — L918 Other hypertrophic disorders of the skin: Secondary | ICD-10-CM

## 2014-04-15 DIAGNOSIS — F319 Bipolar disorder, unspecified: Secondary | ICD-10-CM

## 2014-04-15 DIAGNOSIS — M2550 Pain in unspecified joint: Secondary | ICD-10-CM

## 2014-04-15 LAB — LIPID PANEL
Cholesterol: 219 mg/dL — ABNORMAL HIGH (ref 0–200)
HDL: 40.8 mg/dL (ref >39.00)
LDL Cholesterol: 142 mg/dL — ABNORMAL HIGH (ref 0–99)
Total CHOL/HDL Ratio: 5
Triglycerides: 183 mg/dL — ABNORMAL HIGH (ref 0.0–149.0)
VLDL: 36.6 mg/dL (ref 0.0–40.0)

## 2014-04-15 LAB — BASIC METABOLIC PANEL
BUN: 18 mg/dL (ref 6–23)
CALCIUM: 9.4 mg/dL (ref 8.4–10.5)
CO2: 26 mEq/L (ref 19–32)
Chloride: 105 mEq/L (ref 96–112)
Creatinine, Ser: 0.6 mg/dL (ref 0.4–1.2)
GFR: 138.47 mL/min (ref >60.00)
Glucose, Bld: 131 mg/dL — ABNORMAL HIGH (ref 70–99)
POTASSIUM: 4 meq/L (ref 3.5–5.1)
SODIUM: 139 meq/L (ref 135–145)

## 2014-04-15 LAB — MICROALBUMIN / CREATININE URINE RATIO
CREATININE, U: 230 mg/dL
MICROALB/CREAT RATIO: 7.9 mg/g (ref 0.0–30.0)
Microalb, Ur: 18.1 mg/dL — ABNORMAL HIGH (ref 0.0–1.9)

## 2014-04-15 LAB — HEMOGLOBIN A1C: Hgb A1c MFr Bld: 7.1 % — ABNORMAL HIGH (ref 4.6–6.5)

## 2014-04-15 MED ORDER — PRAVASTATIN SODIUM 40 MG PO TABS: 40.0000 mg | ORAL_TABLET | Freq: Every day | ORAL | Status: DC

## 2014-04-15 NOTE — Progress Notes (Signed)
Pre visit review using our clinic review tool, if applicable. No additional management support is needed unless otherwise documented below in the visit note. 

## 2014-04-15 NOTE — Progress Notes (Signed)
No chief complaint on file.   HPI:  Here for CPE:  -Concerns today/follow up:  R forearm strain: -resolved  DM: -followed by dr. Cruzita Lederer in endocrine, has v-go, much improved, has appt the 5th  Bipolar: -followed by Ouida Sills Arthritis: Followed by dr. Ouida Sills, is to get yearly retinal exams Sees Dr. Ouida Sills - on plaquenil  Skin tag: Under R arm  Hotflashes: -mild, has ovaries, s/p hysterectomy, does not want to take medications  -Diet:planning on doing vegan  -not taking vitamin D and calcium  -Exercise: no regular exercise - but going to get back into kickboxing  -Diabetes and Dyslipidemia Screening: done last year, diabetes managed by endocrinologist  -Hx of HTN: no  -Vaccines: pneumonia vaccine 13 needed;   -pap history: s/p hysterectomy  -FDLMP: N/A  -sexual activity: yes, female partner, no new partners  -wants STI testing: not interested  -FH breast, colon or ovarian ca: see FH  -Alcohol, Tobacco, drug use: see social history  Review of Systems - negative 11 ROSexcept where stated above, has occ joint issues (sees Dr. Ouida Sills).  Past Medical History  Diagnosis Date  . Diabetes mellitus   . Bipolar disorder   . Asthma   . Coronary artery disease   . Arthritis   . Dyspnea 04/13/2011    pfts 03/2011:  Normal FEV1 and FEV1/FVC Ct chest 2012:  No PE, normal parenchyma [name] 04/18/2011 with active symptoms:  Normal FEV1%, mild decrease in FVC due to restriction vs airtrapping, normal appearing       flow volume loop.    . Depression   . Chicken pox   . Seasonal allergies   . Migraines   . 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 Allergy   . Myocardial infarction     Past Surgical History  Procedure Laterality Date  . Abdominal hysterectomy    . Tubal ligation    . Appendectomy    . Myomectomy    . Cesarean section    . Breast cyst excision    . Inner ear surgery      tubes in ears    Family History  Problem Relation Age  of Onset  . Allergies Sister   . Allergies Daughter   . Allergies Daughter   . Asthma Daughter   . Asthma Daughter   . Arthritis Maternal Grandmother   . Hyperlipidemia Maternal Grandmother   . Diabetes Maternal Grandmother   . Hypertension Maternal Aunt   . Sudden death Mother   . Kidney failure Mother   . Sudden death Father   . Mental illness    . Colon cancer Neg Hx   . Rectal cancer Neg Hx   . Stomach cancer Neg Hx     History   Social History  . Marital Status: Married    Spouse Name: N/A    Number of Children: 2  . Years of Education: N/A   Occupational History  . massage therapist and Esthetician    Social History Main Topics  . Smoking status: Never Smoker   . Smokeless tobacco: Never Used  . Alcohol Use: No  . Drug Use: No  . Sexual Activity: None   Other Topics Concern  . 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      Spiritual Beliefs:  Lifestyle: MWF does kickboxing, vegetarian - trying to go vegan             Current outpatient prescriptions:hydroxychloroquine (PLAQUENIL) 200 MG tablet, Take 400 mg by mouth daily., Disp: , Rfl: ;  Insulin Disposable Pump (V-GO 20) KIT, 1 Device by Does not apply route daily., Disp: 30 kit, Rfl: 3;  JANUMET 50-1000 MG per tablet, take 1 tablet by mouth twice a day with meals, Disp: 60 tablet, Rfl: 0;  lamoTRIgine (LAMICTAL) 100 MG tablet, Take 200 mg by mouth daily. , Disp: , Rfl:  ONETOUCH VERIO test strip, TEST twice a day, Disp: 100 each, Rfl: 10;  sertraline (ZOLOFT) 25 MG tablet, Take 25 mg by mouth daily. , Disp: , Rfl: ;  VENTOLIN HFA 108 (90 BASE) MCG/ACT inhaler, inhale 2 puffs by mouth every 6 hours if needed, Disp: 18 g, Rfl: 0  EXAM:  Filed Vitals:   04/15/14 0832  BP: 116/82  Temp: 98.3 F (36.8 C)    GENERAL: vitals reviewed and listed below, alert, oriented, appears well hydrated and  in no acute distress  HEENT: head atraumatic, PERRLA, normal appearance of eyes, ears, nose and mouth. moist mucus membranes.  NECK: supple, no masses or lymphadenopathy  LUNGS: clear to auscultation bilaterally, no rales, rhonchi or wheeze  CV: HRRR, no peripheral edema or cyanosis, normal pedal pulses  BREAST: declined  ABDOMEN: bowel sounds normal, soft, non tender to palpation, no masses, no rebound or guarding  GU: declined  RECTAL: refused  SKIN: no rash or abnormal lesions  MS: normal gait, moves all extremities normally  NEURO: normal gait PSYCH: normal affect, pleasant and cooperative  ASSESSMENT AND PLAN:  Discussed the following assessment and plan:  Visit for preventive health examination  -Discussed and advised all [place] preventive services health task force level A and B recommendations for age, sex and risks.  -Advised at least 150 minutes of exercise per week and a healthy diet low in saturated fats and sweets and consisting of fresh fruits and vegetables, lean meats such as fish and white chicken and whole grains.  -FASTIN labs, studies and vaccines per orders this encounter  Type II or unspecified type diabetes mellitus with neurological manifestations, uncontrolled(250.62) - Plan: Hemoglobin P4D, Basic metabolic panel, Microalbumin/Creatinine Ratio, Urine  Bipolar affective disorder -followed by psych - she will discuss meds in relation to hot flashes  Polyarthralgia -followed by rheumm  Obesity, unspecified - Plan: Lipid Panel -lifestyle recs  Perimenopause -discussed options, prefers not to do meds, lifestyle recs  Skin tag -discussed options, she prefers not to remove at this time   Orders Placed This Encounter  Procedures  . Hemoglobin A1c  . Basic metabolic panel  . Microalbumin/Creatinine Ratio, Urine  . Lipid Panel  . HM DIABETES EYE EXAM    This external order was created through the Results Console.    Patient Instructions   -schedule mammogram  -eye exam with an eye doctor yearly for the diabetes and the inflammatory arthritis  -1000 IU of Vit D3 daily and 1228m calcium from diet and supplement daily (about half usually comes from diet)  -schedule your mammogram  -We have ordered labs or studies at this visit. It can take up to 1-2 weeks for results and processing. We will contact you with instructions IF your results are abnormal. Normal [facility] will [facility] If you have not heard from [place] can not find your [facility] please contact our  office.  -PLEASE SIGN UP FOR MYCHART TODAY   We recommend the following healthy lifestyle measures: - eat a healthy diet consisting of lots of vegetables, fruits, beans, nuts, seeds, healthy meats such as white chicken and fish and whole grains.  - avoid fried foods, fast food, processed foods, sodas, red meet and other fattening foods.  - get a least 150 minutes of aerobic exercise per week.   Follow up in: 4-6 months and as needed       Patient advised to return to clinic immediately if symptoms worsen or persist or new concerns.    No Follow-up on file.  Kerri Osborn

## 2014-04-15 NOTE — Addendum Note (Signed)
Addended by: Lucretia Kern on: 04/15/2014 12:43 PM   Modules accepted: Orders

## 2014-04-15 NOTE — Patient Instructions (Signed)
-  schedule mammogram  -eye exam with an eye doctor yearly for the diabetes and the inflammatory arthritis  -1000 IU of Vit D3 daily and 1200mg  calcium from diet and supplement daily (about half usually comes from diet)  -schedule your mammogram  -We have ordered labs or studies at this visit. It can take up to 1-2 weeks for results and processing. We will contact you with instructions IF your results are abnormal. Normal results will be released to your [facility]. If you have not heard from [place] or can not find your results in [facility] in 2 weeks please contact our office.  -PLEASE SIGN UP FOR MYCHART TODAY   We recommend the following healthy lifestyle measures: - eat a healthy diet consisting of lots of vegetables, fruits, beans, nuts, seeds, healthy meats such as white chicken and fish and whole grains.  - avoid fried foods, fast food, processed foods, sodas, red meet and other fattening foods.  - get a least 150 minutes of aerobic exercise per week.   Follow up in: 4-6 months and as needed

## 2014-04-21 ENCOUNTER — Ambulatory Visit: Admitting: *Deleted

## 2014-05-15 ENCOUNTER — Other Ambulatory Visit: Payer: Self-pay | Admitting: Family Medicine

## 2014-06-04 ENCOUNTER — Emergency Department (HOSPITAL_COMMUNITY)

## 2014-06-04 ENCOUNTER — Emergency Department (HOSPITAL_COMMUNITY)
Admission: EM | Admit: 2014-06-04 | Discharge: 2014-06-04 | Disposition: A | Discharge status: Home / Self Care | Attending: Emergency Medicine | Admitting: Emergency Medicine

## 2014-06-04 ENCOUNTER — Telehealth: Payer: Self-pay | Admitting: Family Medicine

## 2014-06-04 ENCOUNTER — Encounter (HOSPITAL_COMMUNITY): Payer: Self-pay | Admitting: Emergency Medicine

## 2014-06-04 DIAGNOSIS — G43909 Migraine, unspecified, not intractable, without status migrainosus: Secondary | ICD-10-CM | POA: Insufficient documentation

## 2014-06-04 DIAGNOSIS — M25551 Pain in right hip: Secondary | ICD-10-CM

## 2014-06-04 DIAGNOSIS — M79605 Pain in left leg: Secondary | ICD-10-CM

## 2014-06-04 DIAGNOSIS — F319 Bipolar disorder, unspecified: Secondary | ICD-10-CM | POA: Insufficient documentation

## 2014-06-04 DIAGNOSIS — J45909 Unspecified asthma, uncomplicated: Secondary | ICD-10-CM | POA: Insufficient documentation

## 2014-06-04 DIAGNOSIS — M79604 Pain in right leg: Secondary | ICD-10-CM

## 2014-06-04 DIAGNOSIS — I252 Old myocardial infarction: Secondary | ICD-10-CM | POA: Insufficient documentation

## 2014-06-04 DIAGNOSIS — M25569 Pain in unspecified knee: Secondary | ICD-10-CM | POA: Insufficient documentation

## 2014-06-04 DIAGNOSIS — E119 Type 2 diabetes mellitus without complications: Secondary | ICD-10-CM | POA: Insufficient documentation

## 2014-06-04 DIAGNOSIS — M25559 Pain in unspecified hip: Secondary | ICD-10-CM | POA: Insufficient documentation

## 2014-06-04 DIAGNOSIS — M129 Arthropathy, unspecified: Secondary | ICD-10-CM | POA: Insufficient documentation

## 2014-06-04 DIAGNOSIS — M25552 Pain in left hip: Secondary | ICD-10-CM

## 2014-06-04 DIAGNOSIS — I2510 Atherosclerotic heart disease of native coronary artery without angina pectoris: Secondary | ICD-10-CM | POA: Insufficient documentation

## 2014-06-04 DIAGNOSIS — Z880 Allergy status to penicillin: Secondary | ICD-10-CM | POA: Insufficient documentation

## 2014-06-04 DIAGNOSIS — M25579 Pain in unspecified ankle and joints of unspecified foot: Secondary | ICD-10-CM | POA: Insufficient documentation

## 2014-06-04 DIAGNOSIS — Z8619 Personal history of other infectious and parasitic diseases: Secondary | ICD-10-CM | POA: Insufficient documentation

## 2014-06-04 MED ORDER — OXYCODONE-ACETAMINOPHEN 5-325 MG PO TABS
2.0000 | ORAL_TABLET | Freq: Once | ORAL | Status: AC
  Administered 2014-06-04: 2 via ORAL
  Filled 2014-06-04: qty 2

## 2014-06-04 MED ORDER — CYCLOBENZAPRINE HCL 10 MG PO TABS: 10.0000 mg | ORAL_TABLET | Freq: Two times a day (BID) | ORAL | Status: DC | PRN

## 2014-06-04 MED ORDER — OXYCODONE-ACETAMINOPHEN 5-325 MG PO TABS: 1.0000 | ORAL_TABLET | ORAL | Status: DC | PRN

## 2014-06-04 NOTE — ED Notes (Signed)
Pt states that she was walking yesterday and started having bilateral pain from ankle up the leg into there hip.  Pt states that she called her primary care and was told to "come immediately to the ER because it didn't sound good".

## 2014-06-04 NOTE — Discharge Instructions (Signed)
Take Percocet as needed for pain. Take Flexeril as needed for muscle spasm. You may take these medications together. Refer to attached documents for more information. Return to the ED with worsening or concerning symptoms.

## 2014-06-04 NOTE — Telephone Encounter (Signed)
Patient Information:  Caller Name: Kennedy  Phone: [phone]  Patient: Kerri Osborn, Kerri Osborn  Gender: Female  DOB: [birth year]  Age: 53 Years  PCP: Maudie Mercury (TEXT 1st, after 20 mins can call), [facility])  Office Follow Up:  Does the office need to follow up with this patient?: Yes  Instructions For The Office: There are no appts until the end of the day (the disposition is see now). Do you want this pt to be seen at the ED? Or if the end of the day is ok/please call pt back and schedule. Please call and advise pt.   Symptoms  Reason For Call & Symptoms: Pt had called for an appt today. Last night when she was getting off work/she had a stabbing pain going up both  legs to her pelvis. It starts in the ankle and shoots all the way up/both legs are the same.  She has had the pain all night when she tried to move in bed.  No known injury. Her legs do not appear swollen but her feet appear red this am. If she is sitting down there is no pain. It is only when she goes to walk. Pt states pain is severe/like childbirth.  Reviewed Health History In EMR: Yes  Reviewed Medications In EMR: Yes  Reviewed Allergies In EMR: Yes  Reviewed Surgeries / Procedures: Yes  Date of Onset of Symptoms: 06/03/2014  Guideline(s) Used:  Leg Pain  Disposition Per Guideline:   Go to Office Now  Reason For Disposition Reached:   Severe pain (e.g., excruciating, unable to do any normal activities)  Advice Given:  N/A  Patient Will Follow Care Advice:  YES

## 2014-06-04 NOTE — ED Provider Notes (Signed)
CSN: [id number]     Arrival date & time 06/04/14  1243 History  This chart was scribed for Alvina Chou, PA, working with Blanchard Kelch, MD, by Delphia Grates, ED Scribe. This patient was seen in room WTR6/WTR6 and the patient's care was started at 2:19 PM.     Chief Complaint  Patient presents with  . Foot Pain  . Leg Pain      Patient is a 53 y.o. female presenting with leg pain. The history is provided by the patient. No language interpreter was used.  Leg Pain Location:  Leg Leg location:  L leg and R leg Pain details:    Quality:  Shooting   Radiates to:  Groin   Severity:  Moderate   Onset quality:  Sudden   Duration:  1 day   Timing:  Constant   Progression:  Worsening Chronicity:  New Dislocation: no   Foreign body present:  No foreign bodies Associated symptoms: no fatigue, no fever, no itching and no neck pain     HPI Comments: Kerri Osborn is a 53 y.o. female who presents to the Emergency Department complaining of gradually worsening, bilateral leg pain that began yesterday. She reports intesne pain that radiates from her legs to her groin area when ambulating. She states she contacted primary care and was told to come to the ED. Patient states she was unable to lift her legs last night and required assistance to get out of the car and into the house. She reports pain with movement and pain at rest, except when laying supine. She denies calf pain, knee pain, back pain, prior falls or injuries. Patient also denies history of similar pain.     Past Medical History  Diagnosis Date  . Diabetes mellitus   . Bipolar disorder   . Asthma   . Coronary artery disease   . Arthritis   . Dyspnea 04/13/2011    pfts 03/2011:  Normal FEV1 and FEV1/FVC Ct chest 2012:  No PE, normal parenchyma [name] 04/18/2011 with active symptoms:  Normal FEV1%, mild decrease in FVC due to restriction vs airtrapping, normal appearing       flow volume loop.    . Depression   .  Chicken pox   . Seasonal allergies   . Migraines   . 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 Allergy   . Myocardial infarction    Past Surgical History  Procedure Laterality Date  . Abdominal hysterectomy    . Tubal ligation    . Appendectomy    . Myomectomy    . Cesarean section    . Breast cyst excision    . Inner ear surgery      tubes in ears   Family History  Problem Relation Age of Onset  . Allergies Sister   . Allergies Daughter   . Allergies Daughter   . Asthma Daughter   . Asthma Daughter   . Arthritis Maternal Grandmother   . Hyperlipidemia Maternal Grandmother   . Diabetes Maternal Grandmother   . Hypertension Maternal Aunt   . Sudden death Mother   . Kidney failure Mother   . Sudden death Father   . Mental illness    . Colon cancer Neg Hx   . Rectal cancer Neg Hx   . Stomach cancer Neg Hx    History  Substance Use Topics  . Smoking status: Never Smoker   . Smokeless tobacco: Never Used  .  Alcohol Use: No   OB History   Grav Para Term Preterm Abortions TAB SAB Ect Mult Living                 Review of Systems  Constitutional: Negative for fever and fatigue.  Musculoskeletal: Negative for neck pain.       Bilateral leg pain  Skin: Negative for itching.  All other systems reviewed and are negative.     Allergies  Amoxicillin  Home Medications   Prior to Admission medications   Medication Sig Start Date End Date Taking? Authorizing Marsden Zaino  albuterol (PROVENTIL HFA;VENTOLIN HFA) 108 (90 BASE) MCG/ACT inhaler Inhale 2 puffs into the lungs every 6 (six) hours as needed for wheezing or shortness of breath.   Yes Historical Iqra Rotundo, MD  hydroxychloroquine (PLAQUENIL) 200 MG tablet Take 400 mg by mouth daily.   Yes Historical Jireh Elmore, MD  Insulin Human (INSULIN PUMP) SOLN Inject 1 each into the skin continuous. Humalog insulin   Yes Historical Kael Keetch, MD  JANUMET 50-1000 MG per tablet take 1 tablet by mouth twice a day with meals  05/15/14  Yes Lucretia Kern, DO  lamoTRIgine (LAMICTAL) 100 MG tablet Take 200 mg by mouth daily.    Yes Historical Kenedie Dirocco, MD  pravastatin (PRAVACHOL) 40 MG tablet Take 1 tablet (40 mg total) by mouth daily. 04/15/14  Yes Lucretia Kern, DO  sertraline (ZOLOFT) 25 MG tablet Take 25 mg by mouth daily.    Yes Historical Skylie Hiott, MD   Triage Vitals: BP 138/67  Pulse 91  Temp(Src) 98.1 F (36.7 C) (Oral)  Resp 18  SpO2 100%  Physical Exam  Nursing note and vitals reviewed. Constitutional: She is oriented to person, place, and time. She appears well-developed and well-nourished. No distress.  HENT:  Head: Normocephalic and atraumatic.  Eyes: Conjunctivae and EOM are normal.  Neck: Neck supple. No tracheal deviation present.  Cardiovascular: Normal rate and intact distal pulses.   Pulmonary/Chest: Effort normal. No respiratory distress.  Abdominal: Soft. She exhibits no distension. There is no tenderness. There is no rebound.  Musculoskeletal: Normal range of motion.  Limited ROM of bilateral legs at the hip joints due to pain. No obvious deformity. Bilateral lateral hip tenderness to palpation.   Neurological: She is alert and oriented to person, place, and time.  Lower extremity strength slightly diminished bilaterally due to pain. Sensation equal and intact bilaterally.   Skin: Skin is warm and dry.  Psychiatric: She has a normal mood and affect. Her behavior is normal.    ED Course  Procedures (including critical care time)  DIAGNOSTIC STUDIES: Oxygen Saturation is 100% on room air, normal by my interpretation.    COORDINATION OF CARE: At 5701 Discussed treatment plan with patient which includes imaging. Patient agrees.   Labs Review Labs Reviewed - No data to display  Imaging Review Dg Lumbar Spine Complete  06/04/2014   CLINICAL DATA:  Shooting pain of bilateral femur radiates to the lower back for 2 days.  EXAM: LUMBAR SPINE - COMPLETE 4+ VIEW  COMPARISON:  None.   FINDINGS: There is no evidence of lumbar spine fracture. Alignment is normal. There is mild decreased intervertebral space at L5-S1.  IMPRESSION: No acute fracture dislocation. Mild degenerative joint changes of L5-S1.   Electronically Signed   By: Abelardo Diesel M.D.   On: 06/04/2014 15:27   Dg Pelvis 1-2 Views  06/04/2014   CLINICAL DATA:  Bilateral femur pain  EXAM: PELVIS - 1-2 VIEW  COMPARISON:  None.  FINDINGS: Hips are located. No pelvic fracture or sacral fracture. There is sclerosis at the pubic symphysis.  IMPRESSION: No acute osseous findings   Electronically Signed   By: Suzy Bouchard M.D.   On: 06/04/2014 15:17     EKG Interpretation None      MDM   Final diagnoses:  Bilateral hip pain  Bilateral leg pain    3:44 PM Patient's xrays unremarkable for acute changes. Patient was given percocet for pain which provided relief. Patient states her pain has improved. She is able to sit up in bed and was able to walk to the bathroom without difficulty. Patient potentially has bilateral hip bursitis, although these symptoms wouldn't start suddenly, she is tender over bilateral lateral hips without fracture. No saddle paresthesias or bladder/bowel incontinence. Patient advised to follow up with PCP. Patient instructed to return to the ED with worsening or concerning symptoms. Patient will be discharged with Percocet and Flexeril.   I personally performed the services described in this documentation, which was scribed in my presence. The recorded information has been reviewed and is accurate.    Alvina Chou, PA-C 06/04/14 1550

## 2014-06-04 NOTE — Telephone Encounter (Signed)
I called the pt and advised her per Dr Maudie Mercury her symptoms sound serious and she should go to the ER of her choice and she agreed.  She asked if the ER would call Dr Maudie Mercury and she see her there and I advised her the physician in the ER will see her.

## 2014-06-04 NOTE — ED Provider Notes (Signed)
Medical screening examination/treatment/procedure(s) were performed by non-physician practitioner and as supervising physician I was immediately available for consultation/collaboration.   EKG Interpretation None        Blanchard Kelch, MD 06/04/14 1615

## 2014-06-06 ENCOUNTER — Ambulatory Visit (INDEPENDENT_AMBULATORY_CARE_PROVIDER_SITE_OTHER): Admitting: Family Medicine

## 2014-06-06 ENCOUNTER — Telehealth: Payer: Self-pay | Admitting: Family Medicine

## 2014-06-06 ENCOUNTER — Encounter: Payer: Self-pay | Admitting: Family Medicine

## 2014-06-06 VITALS — BP 130/90 | HR 96 | Temp 98.0°F | Ht 64.75 in | Wt 206.0 lb

## 2014-06-06 DIAGNOSIS — M5137 Other intervertebral disc degeneration, lumbosacral region: Secondary | ICD-10-CM

## 2014-06-06 DIAGNOSIS — M25559 Pain in unspecified hip: Secondary | ICD-10-CM

## 2014-06-06 DIAGNOSIS — M25569 Pain in unspecified knee: Secondary | ICD-10-CM

## 2014-06-06 DIAGNOSIS — M5136 Other intervertebral disc degeneration, lumbar region: Secondary | ICD-10-CM

## 2014-06-06 NOTE — Telephone Encounter (Signed)
Pt states dr. Maudie Mercury suggested pt go to the er on Wednesday, pt states the er suggested she has a mri done, pt would like to know if dr. Maudie Mercury agrees and if so can she get an order to get it done. Pt states she is still in a lot of pain near pelvis area/leg

## 2014-06-06 NOTE — Progress Notes (Signed)
Pre visit review using our clinic review tool, if applicable. No additional management support is needed unless otherwise documented below in the visit note. 

## 2014-06-06 NOTE — Patient Instructions (Addendum)
-  We placed a referral for you as discussed. It usually takes about 1-2 weeks to process and schedule this referral. If you have not heard from [place] regarding this appointment in 2 weeks please contact our office.  -use aleve or tyelnol per instructions as needed for pain and percocet as little as possible and do not take with tylenol  -exercises as tolerated  -seek care immediately if symptoms are changing or worsening

## 2014-06-06 NOTE — Telephone Encounter (Signed)
From review of ED notes, lookes like they felt she has bursitis - which does not require an MRI. She is advised to follow up here or if she wishes can place referral to specialist.

## 2014-06-06 NOTE — Telephone Encounter (Signed)
Patient informed and states she is in a lot of pain and an appt was scheduled for today with Dr Maudie Mercury.

## 2014-06-06 NOTE — Progress Notes (Signed)
No chief complaint on file.   HPI:  Acute Visit for:  Bilat Hip pain: -this started a few days ago and evaluated in ed with ? bursitis -pain is in bilateral entire legs, hips, buttocks and low back - initially started in feet and radiates up with steps per her report - then reports not sure if radiates and may actually be in lat hips only -sharp, severe and brief  -now improving and feels in bilateral lat hips and R buttock with lying on sides and some movements  -denies: fever, chills, bowel or bladder dysfunction, nausea/vomiting diarrhea, dysuria, vaginal symptoms, weakness, numbness -in the process of moving - but not really doing anything differently lately per her report, no lifting or bending -did not take any pain medication today, but helped  Review ED notes and xray report copied below: EXAM:  LUMBAR SPINE - COMPLETE 4+ VIEW  COMPARISON: None.  FINDINGS:  There is no evidence of lumbar spine fracture. Alignment is normal.  There is mild decreased intervertebral space at L5-S1.  IMPRESSION:  No acute fracture dislocation. Mild degenerative joint changes of  L5-S1.    ROS: See pertinent positives and negatives per HPI.  Past Medical History  Diagnosis Date  . Diabetes mellitus   . Bipolar disorder   . Asthma   . Coronary artery disease   . Arthritis   . Dyspnea 04/13/2011    pfts 03/2011:  Normal FEV1 and FEV1/FVC Ct chest 2012:  No PE, normal parenchyma [name] 04/18/2011 with active symptoms:  Normal FEV1%, mild decrease in FVC due to restriction vs airtrapping, normal appearing       flow volume loop.    . Depression   . Chicken pox   . Seasonal allergies   . Migraines   . 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 Allergy   . Myocardial infarction     Past Surgical History  Procedure Laterality Date  . Abdominal hysterectomy    . Tubal ligation    . Appendectomy    . Myomectomy    . Cesarean section    . Breast cyst excision    . Inner ear surgery       tubes in ears    Family History  Problem Relation Age of Onset  . Allergies Sister   . Allergies Daughter   . Allergies Daughter   . Asthma Daughter   . Asthma Daughter   . Arthritis Maternal Grandmother   . Hyperlipidemia Maternal Grandmother   . Diabetes Maternal Grandmother   . Hypertension Maternal Aunt   . Sudden death Mother   . Kidney failure Mother   . Sudden death Father   . Mental illness    . Colon cancer Neg Hx   . Rectal cancer Neg Hx   . Stomach cancer Neg Hx     History   Social History  . Marital Status: Married    Spouse Name: N/A    Number of Children: 2  . Years of Education: N/A   Occupational History  . massage therapist and Esthetician    Social History Main Topics  . Smoking status: Never Smoker   . Smokeless tobacco: Never Used  . Alcohol Use: No  . Drug Use: No  . Sexual Activity: None   Other Topics Concern  . 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      Spiritual Beliefs:       Lifestyle: MWF does kickboxing, vegetarian - trying to go vegan             Current outpatient prescriptions:albuterol (PROVENTIL HFA;VENTOLIN HFA) 108 (90 BASE) MCG/ACT inhaler, Inhale 2 puffs into the lungs every 6 (six) hours as needed for wheezing or shortness of breath., Disp: , Rfl: ;  cyclobenzaprine (FLEXERIL) 10 MG tablet, Take 1 tablet (10 mg total) by mouth 2 (two) times daily as needed for muscle spasms., Disp: 10 tablet, Rfl: 0 hydroxychloroquine (PLAQUENIL) 200 MG tablet, Take 400 mg by mouth daily., Disp: , Rfl: ;  Insulin Human (INSULIN PUMP) SOLN, Inject 1 each into the skin continuous. Humalog insulin, Disp: , Rfl: ;  JANUMET 50-1000 MG per tablet, take 1 tablet by mouth twice a day with meals, Disp: 60 tablet, Rfl: 3;  lamoTRIgine (LAMICTAL) 100 MG tablet, Take 200 mg by mouth daily. , Disp: , Rfl:  oxyCODONE-acetaminophen  (PERCOCET/ROXICET) 5-325 MG per tablet, Take 1-2 tablets by mouth every 4 (four) hours as needed for moderate pain or severe pain., Disp: 12 tablet, Rfl: 0;  pravastatin (PRAVACHOL) 40 MG tablet, Take 1 tablet (40 mg total) by mouth daily., Disp: 30 tablet, Rfl: 4;  sertraline (ZOLOFT) 100 MG tablet, Take 100 mg by mouth daily., Disp: , Rfl:   EXAM:  Filed Vitals:   06/06/14 1546  BP: 130/90  Pulse: 96  Temp: 98 F (36.7 C)    Body mass index is 34.53 kg/(m^2).  GENERAL: vitals reviewed and listed above, alert, oriented, appears well hydrated and in no acute distress  HEENT: atraumatic, conjunttiva clear, no obvious abnormalities on inspection of external nose and ears  NECK: no obvious masses on inspection  LUNGS: clear to auscultation bilaterally, no wheezes, rales or rhonchi, good air movement  CV: HRRR, no peripheral edema  MS: moves all extremities without noticeable abnormality Normal Gait Normal inspection of back, no obvious scoliosis or leg length descrepancy No bony TTP Soft tissue TTP at: R lumbar paraspinal muscles, R L hip in greater trochanter region  -/+ tests: neg trendelenburg,+facet loading bilat, -SLRT, -CLRT, -FABER, -FADIR Normal muscle strength, sensation to light touch and vibratory touch in LE bilat and DTRs in LEs bilaterally mildly brisk, NV intact distally bilat  PSYCH: pleasant and cooperative, no obvious depression or anxiety  ASSESSMENT AND PLAN:  Discussed the following assessment and plan:  Hip pain, unspecified laterality - Plan: AMB referral to orthopedics  Pain in joint, lower leg, unspecified laterality - Plan: AMB referral to orthopedics  Degeneration of intervertebral disc of lumbar region - Plan: AMB referral to orthopedics  -she has odd and reported severe symptoms that seem to be improving -exam without neuro deficits or alarming features at this time -we discussed possible serious and likely etiologies, workup and treatment,  treatment risks and return precautions -after this discussion, Nicha opted for HEP, conservative treatment and evaluation by specialist -of course, we advised Rosalie  to return or notify a doctor immediately if symptoms worsen or persist or new concerns arise. -> 25 minutes spent face to face with this patient with over 50% of the time in counseling the patient on her hip and leg pain, work up, treatment, risks, options  -Patient advised to return or notify a doctor immediately if symptoms worsen or persist or new concerns arise.  Patient Instructions  -We placed a referral for you as discussed. It  usually takes about 1-2 weeks to process and schedule this referral. If you have not heard from [place] regarding this appointment in 2 weeks please contact our office.  -use aleve or tyelnol per instructions as needed for pain and percocet as little as possible and do not take with tylenol  -exercises as tolerated  -seek care immediately if symptoms are changing or worsening     Leeah Politano R.

## 2014-06-10 ENCOUNTER — Encounter: Attending: Internal Medicine | Admitting: *Deleted

## 2014-06-10 ENCOUNTER — Encounter: Payer: Self-pay | Admitting: *Deleted

## 2014-06-10 VITALS — Ht 63.0 in | Wt 204.7 lb

## 2014-06-10 DIAGNOSIS — E1149 Type 2 diabetes mellitus with other diabetic neurological complication: Secondary | ICD-10-CM | POA: Diagnosis present

## 2014-06-10 DIAGNOSIS — Z713 Dietary counseling and surveillance: Secondary | ICD-10-CM | POA: Diagnosis not present

## 2014-06-10 NOTE — Patient Instructions (Signed)
Plan: Start taking insulin from V-Go before the meal to help with post meal BG's Start rotating your sites for the V-Go to other places we discussed today Consider aiming for 3 Carb choices per meal +/- 1 either way

## 2014-06-10 NOTE — Progress Notes (Signed)
  Medical Nutrition Therapy:  Appt start time: 0900 end time:  1000.  Assessment:  Primary concerns today: patient here for follow up while being on V-Go insulin delivery device. She has not been back to see me or Dr. Cruzita Lederer for follow up appointments made in April, 2014. She also states her sister was in a major auto accident in [place] last year and she moved up there to take care of her for several months. She states she has recently had a problem with blood entering the V-Go reservoir twice in a row so she came off of the V-Go this past weekend and is taking injections right now. She states she received sample vials of Levemir and Humalog from Dr. Arman Filter office yesterday. She states she is bolusing 3 button presses after each meal which equals 6 units. She expressed interest in attending the Diabetes Core Classes for further education.  MEDICATIONS: see list. Humalog in V-Go insulin delivery device   DIETARY INTAKE:  Usual eating pattern includes 3 meals and 1-2 snacks per day.  Everyday foods include good variety of all food groups except for meat.  Avoided foods include meats- she was vegetarian, sweets and sodas.    Estimated energy needs: 1400 calories 158 g carbohydrates 105 g protein 39 g fat  Progress Towards Goal(s):  In progress.   Nutritional Diagnosis:  NB-1.1 Food and nutrition-related knowledge deficit As related to diabetes management.  As evidenced by A1c of 8.0 on 02/05/13 which improved to 7.1% on 04/15/2013    Intervention: Suggested she alternate her sites for the V-Go as she has been using similar sites below her waist band. Provided demonstration of areas to expand to. Also reviewed Carb Counting briefly and suggested 2-3 Carb Choices per meal. Encouraged her to take her bolus prior to the meal instead of after to improve her post meal BG ranges. Suggested she request a referral for the Core Diabetes Classes so that can be scheduled.   Plan: Start taking  insulin from V-Go before the meal to help with post meal BG's Start rotating your sites for the V-Go to other places we discussed today Consider aiming for 3 Carb choices per meal +/- 1 either way  Handouts given during visit include: Carb Counting and Food Label handouts Meal Plan Card Menu Planning Sheet  Monitoring/Evaluation:  Dietary intake, exercise, reading food labels, and body weight in 3 months.

## 2014-06-26 ENCOUNTER — Emergency Department (HOSPITAL_COMMUNITY)
Admission: EM | Admit: 2014-06-26 | Discharge: 2014-06-26 | Disposition: A | Discharge status: Home / Self Care | Attending: Emergency Medicine | Admitting: Emergency Medicine

## 2014-06-26 ENCOUNTER — Encounter (HOSPITAL_COMMUNITY): Payer: Self-pay | Admitting: Emergency Medicine

## 2014-06-26 ENCOUNTER — Emergency Department (HOSPITAL_COMMUNITY)

## 2014-06-26 DIAGNOSIS — Z79899 Other long term (current) drug therapy: Secondary | ICD-10-CM | POA: Insufficient documentation

## 2014-06-26 DIAGNOSIS — Z8619 Personal history of other infectious and parasitic diseases: Secondary | ICD-10-CM | POA: Insufficient documentation

## 2014-06-26 DIAGNOSIS — M129 Arthropathy, unspecified: Secondary | ICD-10-CM | POA: Insufficient documentation

## 2014-06-26 DIAGNOSIS — Z8611 Personal history of tuberculosis: Secondary | ICD-10-CM | POA: Insufficient documentation

## 2014-06-26 DIAGNOSIS — Z791 Long term (current) use of non-steroidal anti-inflammatories (NSAID): Secondary | ICD-10-CM | POA: Insufficient documentation

## 2014-06-26 DIAGNOSIS — Z880 Allergy status to penicillin: Secondary | ICD-10-CM | POA: Insufficient documentation

## 2014-06-26 DIAGNOSIS — Z794 Long term (current) use of insulin: Secondary | ICD-10-CM | POA: Insufficient documentation

## 2014-06-26 DIAGNOSIS — J45909 Unspecified asthma, uncomplicated: Secondary | ICD-10-CM | POA: Insufficient documentation

## 2014-06-26 DIAGNOSIS — E119 Type 2 diabetes mellitus without complications: Secondary | ICD-10-CM | POA: Insufficient documentation

## 2014-06-26 DIAGNOSIS — K625 Hemorrhage of anus and rectum: Secondary | ICD-10-CM

## 2014-06-26 DIAGNOSIS — R109 Unspecified abdominal pain: Secondary | ICD-10-CM | POA: Insufficient documentation

## 2014-06-26 DIAGNOSIS — I252 Old myocardial infarction: Secondary | ICD-10-CM | POA: Insufficient documentation

## 2014-06-26 DIAGNOSIS — Z9851 Tubal ligation status: Secondary | ICD-10-CM | POA: Insufficient documentation

## 2014-06-26 DIAGNOSIS — F319 Bipolar disorder, unspecified: Secondary | ICD-10-CM | POA: Insufficient documentation

## 2014-06-26 DIAGNOSIS — Z9089 Acquired absence of other organs: Secondary | ICD-10-CM | POA: Insufficient documentation

## 2014-06-26 DIAGNOSIS — G43909 Migraine, unspecified, not intractable, without status migrainosus: Secondary | ICD-10-CM | POA: Insufficient documentation

## 2014-06-26 DIAGNOSIS — Z90710 Acquired absence of both cervix and uterus: Secondary | ICD-10-CM | POA: Insufficient documentation

## 2014-06-26 HISTORY — DX: Diverticulosis of intestine, part unspecified, without perforation or abscess without bleeding: K57.90

## 2014-06-26 LAB — TYPE AND SCREEN
ABO/RH(D): O POS
ANTIBODY SCREEN: NEGATIVE

## 2014-06-26 LAB — CBC
HCT: 40.4 % (ref 36.0–46.0)
Hemoglobin: 13.4 g/dL (ref 12.0–15.0)
MCH: 27.2 pg (ref 26.0–34.0)
MCHC: 33.2 g/dL (ref 30.0–36.0)
MCV: 82.1 fL (ref 78.0–100.0)
Platelets: 229 10*3/uL (ref 150–400)
RBC: 4.92 MIL/uL (ref 3.87–5.11)
RDW: 12.9 % (ref 11.5–15.5)
WBC: 12.1 10*3/uL — ABNORMAL HIGH (ref 4.0–10.5)

## 2014-06-26 LAB — COMPREHENSIVE METABOLIC PANEL
ALBUMIN: 3.8 g/dL (ref 3.5–5.2)
ALT: 46 U/L — AB (ref 0–35)
AST: 24 U/L (ref 0–37)
Alkaline Phosphatase: 130 U/L — ABNORMAL HIGH (ref 39–117)
Anion gap: 15 (ref 5–15)
BUN: 15 mg/dL (ref 6–23)
CALCIUM: 10.1 mg/dL (ref 8.4–10.5)
CO2: 25 mEq/L (ref 19–32)
Chloride: 97 mEq/L (ref 96–112)
Creatinine, Ser: 0.59 mg/dL (ref 0.50–1.10)
GFR calc non Af Amer: >90 mL/min (ref >90)
GLUCOSE: 278 mg/dL — AB (ref 70–99)
Potassium: 3.8 mEq/L (ref 3.7–5.3)
SODIUM: 137 meq/L (ref 137–147)
TOTAL PROTEIN: 7.8 g/dL (ref 6.0–8.3)
Total Bilirubin: 0.4 mg/dL (ref 0.3–1.2)

## 2014-06-26 LAB — POC OCCULT BLOOD, ED: Fecal Occult Bld: POSITIVE — AB

## 2014-06-26 LAB — ABO/RH: ABO/RH(D): O POS

## 2014-06-26 MED ORDER — SODIUM CHLORIDE 0.9 % IV BOLUS (SEPSIS)
1000.0000 mL | Freq: Once | INTRAVENOUS | Status: AC
  Administered 2014-06-26: 1000 mL via INTRAVENOUS

## 2014-06-26 MED ORDER — IOHEXOL 300 MG/ML  SOLN
100.0000 mL | Freq: Once | INTRAMUSCULAR | Status: AC | PRN
  Administered 2014-06-26: 100 mL via INTRAVENOUS

## 2014-06-26 MED ORDER — PROMETHAZINE HCL 25 MG PO TABS: 25.0000 mg | ORAL_TABLET | Freq: Three times a day (TID) | ORAL | Status: DC | PRN

## 2014-06-26 MED ORDER — IOHEXOL 300 MG/ML  SOLN
50.0000 mL | Freq: Once | INTRAMUSCULAR | Status: AC | PRN
  Administered 2014-06-26: 50 mL via ORAL

## 2014-06-26 NOTE — ED Notes (Signed)
Patient states she went to the bathroom an hour ago and had 2 medium sized blood clots. Patient states she is currently having lower abdominal cramping. Patient states a history of diverticulosis and has had diarrhea in the past 2-3 days.

## 2014-06-26 NOTE — ED Provider Notes (Signed)
CSN: [id number]     Arrival date & time 06/26/14  1652 History   First MD Initiated Contact with Patient 06/26/14 1748     Chief Complaint  Patient presents with  . Rectal Bleeding     (Consider location/radiation/quality/duration/timing/severity/associated sxs/prior Treatment) HPI Patient presents to the emergency department with rectal bleeding, that occurred today.  The patient said she went to the bathroom and noticed 2 blood clots in the toilet.  The patient has had 3 days worth of diarrhea.  The patient, states she's had nausea, but no vomiting.  The patient has a history of diverticulosis.  Patient, states, that she's had small amounts of blood per rectum in the past.  The patient, states, that I felt it was due to hemorrhoids.  The patient, states she's not had any chest pain, shortness breath, weakness, dizziness, back pain, dysuria, vaginal bleeding, vaginal discharge, fever, rash, or syncope.  The patient, states she had some pressure in her lower abdomen.  Patient, states nothing seems to make her condition, better or worse.  Patient, states she's not had much bleeding since that time Past Medical History  Diagnosis Date  . Diabetes mellitus   . Bipolar disorder   . Asthma   . Coronary artery disease   . Arthritis   . Dyspnea 04/13/2011    pfts 03/2011:  Normal FEV1 and FEV1/FVC Ct chest 2012:  No PE, normal parenchyma [name] 04/18/2011 with active symptoms:  Normal FEV1%, mild decrease in FVC due to restriction vs airtrapping, normal appearing       flow volume loop.    . Depression   . Chicken pox   . Seasonal allergies   . Migraines   . 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 Allergy   . Myocardial infarction   . Diverticulosis    Past Surgical History  Procedure Laterality Date  . Abdominal hysterectomy    . Tubal ligation    . Appendectomy    . Myomectomy    . Cesarean section    . Breast cyst excision    . Inner ear surgery      tubes in ears   Family  History  Problem Relation Age of Onset  . Allergies Sister   . Allergies Daughter   . Allergies Daughter   . Asthma Daughter   . Asthma Daughter   . Arthritis Maternal Grandmother   . Hyperlipidemia Maternal Grandmother   . Diabetes Maternal Grandmother   . Hypertension Maternal Aunt   . Sudden death Mother   . Kidney failure Mother   . Sudden death Father   . Mental illness    . Colon cancer Neg Hx   . Rectal cancer Neg Hx   . Stomach cancer Neg Hx    History  Substance Use Topics  . Smoking status: Never Smoker   . Smokeless tobacco: Never Used  . Alcohol Use: No   OB History   Grav Para Term Preterm Abortions TAB SAB Ect Mult Living                 Review of Systems  All other systems negative except as documented in the HPI. All pertinent positives and negatives as reviewed in the HPI.  Allergies  Amoxicillin  Home Medications   Prior to Admission medications   Medication Sig Start Date End Date Taking? Authorizing Provider  albuterol (PROVENTIL HFA;VENTOLIN HFA) 108 (90 BASE) MCG/ACT inhaler Inhale 2 puffs into the lungs  every 6 (six) hours as needed for wheezing or shortness of breath.   Yes Historical Provider, MD  cyclobenzaprine (FLEXERIL) 10 MG tablet Take 1 tablet (10 mg total) by mouth 2 (two) times daily as needed for muscle spasms. 06/04/14  Yes Kaitlyn Szekalski, PA-C  hydroxychloroquine (PLAQUENIL) 200 MG tablet Take 400 mg by mouth daily.   Yes Historical Provider, MD  JANUMET 50-1000 MG per tablet take 1 tablet by mouth twice a day with meals 05/15/14  Yes Lucretia Kern, DO  lamoTRIgine (LAMICTAL) 100 MG tablet Take 100 mg by mouth daily.    Yes Historical Provider, MD  naproxen (NAPROSYN) 250 MG tablet Take 500 mg by mouth 2 (two) times daily as needed (pain).   Yes Historical Provider, MD  oxyCODONE-acetaminophen (PERCOCET/ROXICET) 5-325 MG per tablet Take 1-2 tablets by mouth every 4 (four) hours as needed for moderate pain or severe pain. 06/04/14   Yes Kaitlyn Szekalski, PA-C  pravastatin (PRAVACHOL) 40 MG tablet Take 1 tablet (40 mg total) by mouth daily. 04/15/14  Yes Lucretia Kern, DO  sertraline (ZOLOFT) 100 MG tablet Take 100 mg by mouth daily.   Yes Historical Provider, MD  Insulin Human (INSULIN PUMP) SOLN Inject 1 each into the skin continuous. Humalog insulin    Historical Provider, MD   BP 143/78  Pulse 72  Temp(Src) 98.1 F (36.7 C) (Oral)  Resp 20  SpO2 100% Physical Exam  Constitutional: She appears well-developed and well-nourished. No distress.  HENT:  Head: Normocephalic and atraumatic.  Mouth/Throat: Oropharynx is clear and moist.  Eyes: Pupils are equal, round, and reactive to light.  Neck: Normal range of motion. Neck supple.  Cardiovascular: Normal rate, regular rhythm and normal heart sounds.  Exam reveals no gallop and no friction rub.   No murmur heard. Pulmonary/Chest: Effort normal and breath sounds normal.  Abdominal: Soft. Normal appearance and bowel sounds are normal. She exhibits no distension and no mass. There is tenderness. There is no rigidity, no rebound and no guarding.      ED Course  Procedures (including critical care time) Labs Review Labs Reviewed  CBC - Abnormal; Notable for the following:    WBC 12.1 (*)    All other components within normal limits  COMPREHENSIVE METABOLIC PANEL - Abnormal; Notable for the following:    Glucose, Bld 278 (*)    ALT 46 (*)    Alkaline Phosphatase 130 (*)    All other components within normal limits  POC OCCULT BLOOD, ED - Abnormal; Notable for the following:    Fecal Occult Bld POSITIVE (*)    All other components within normal limits  POC OCCULT BLOOD, ED  TYPE AND SCREEN  ABO/RH    Imaging Review Ct Abdomen Pelvis W Contrast  06/26/2014   CLINICAL DATA:  Patient past 2 meet size blood clots in the bathroom. Currently having lower abdominal pain and cramping. History of diverticulosis.  EXAM: CT ABDOMEN AND PELVIS WITH CONTRAST  TECHNIQUE:  Multidetector CT imaging of the abdomen and pelvis was performed using the standard protocol following bolus administration of intravenous contrast.  CONTRAST:  81mL OMNIPAQUE IOHEXOL 300 MG/ML SOLN, 124mL OMNIPAQUE IOHEXOL 300 MG/ML SOLN  COMPARISON:  There are scattered colonic diverticula, without evidence of diverticulitis. No bowel wall thickening, inflammatory changes or masses are evident. Small bowel is unremarkable. Appendix is not visualized.  Uterus is surgically absent.  No pelvic masses.  Clear lung bases.  Heart is normal in size.  Mild hepatomegaly and  diffuse hepatic steatosis. No liver mass or focal lesion.  Spleen, gallbladder, pancreas, adrenal glands, kidneys, ureters and bladder: Unremarkable.  Borderline enlarged left external iliac lymph node measuring 1 cm, nonspecific but likely reactive. No other prominent or borderline enlarged lymph nodes. No abnormal fluid collections.  Mild degenerative changes of the hips. No osteoblastic or osteolytic lesions.  FINDINGS: 1. No acute findings. 2. Colonic diverticula without evidence of diverticulitis or other bowel inflammation. 3. Mild hepatomegaly and diffuse hepatic steatosis. 4. Status post hysterectomy.   Electronically Signed   By: Lajean Manes M.D.   On: 06/26/2014 19:42     Patient has a negative CT scan, with normal stable.  Laboratory findings and her vital signs been stable.  I will have the patient.  Follow up with GI and her primary care Dr. told to return here as needed.  Patient has not had any weakness, or dizziness.  Here in the emergency department and no further bleeding. I advised the patient This could worsen and she will need to return here if that does occur  Brent General, PA-C 06/26/14 2124

## 2014-06-26 NOTE — Discharge Instructions (Signed)
Return here as needed.  Followup with the GI doctors provided and her primary care Dr.

## 2014-06-27 NOTE — ED Provider Notes (Signed)
Medical screening examination/treatment/procedure(s) were performed by non-physician practitioner and as supervising physician I was immediately available for consultation/collaboration.   EKG Interpretation None        Osvaldo Shipper, MD 06/27/14 [phone]

## 2014-07-02 ENCOUNTER — Ambulatory Visit (INDEPENDENT_AMBULATORY_CARE_PROVIDER_SITE_OTHER): Admitting: Internal Medicine

## 2014-07-02 ENCOUNTER — Encounter: Payer: Self-pay | Admitting: Internal Medicine

## 2014-07-02 VITALS — BP 136/88 | HR 86 | Temp 98.3°F | Resp 12 | Wt 204.0 lb

## 2014-07-02 DIAGNOSIS — E1149 Type 2 diabetes mellitus with other diabetic neurological complication: Secondary | ICD-10-CM

## 2014-07-02 MED ORDER — INSULIN PEN NEEDLE 32G X 4 MM MISC: Status: DC

## 2014-07-02 MED ORDER — INSULIN ASPART 100 UNIT/ML FLEXPEN: 6.0000 [IU] | PEN_INJECTOR | Freq: Three times a day (TID) | SUBCUTANEOUS | Status: DC

## 2014-07-02 MED ORDER — INSULIN GLARGINE 100 UNIT/ML SOLOSTAR PEN: 24.0000 [IU] | PEN_INJECTOR | Freq: Every day | SUBCUTANEOUS | Status: DC

## 2014-07-02 NOTE — Patient Instructions (Addendum)
Please stop the VGo. Continue JanuMet 50/1000 mg 2x a day. Start lantus 24 units at bedtime. Start NovoLog 6 units before every meal. Add the following Sliding scale of NovoLog: - 150-175: + 1 unit  - 176-200: + 2 units  - 201-225: + 3 units  - >225: + 4 units  Please return in 1.5 month with your sugar log.

## 2014-07-02 NOTE — Progress Notes (Signed)
Subjective:     Patient ID: Kerri Osborn, female   DOB: [birth year], 53 y.o.   MRN: [id number]  HPI Kerri Osborn is a 53 y.o. woman, returning for f/u for DM2, dx. 2229, without complications, uncontrolled, insulin-dependent since 2012. Last visit 15 mo ago! - sister had an MVA >> she moved to [place] to take care of her.   Her last A1c was: Lab Results  Component Value Date   HGBA1C 7.1* 04/15/2014   HGBA1C 8.0* 02/05/2013   Treatment regimen: - VGo 20 since last year >> bruising and numbness >> would like switch to injections - JanuMet 50-1000 mg daily bid  Does not bring a log or a meter: - am: 120-190 >> 140s - 2h after b'fast: n/c - before lunch: 140-160 >> n/c - 2h after lunch: n/c - before dinner: 170-214 >> 160s No lows. No highs >100.  She boluses 3 clicks per meal. She has hypoglycemia awareness at 60. No lows recently.  She was vegan but was advised by GI to switch to regular diet (diverticulosis on colonoscopy). She is now eating vegetarian + fish.   - Last eye appt: 2014. Reportedly, no DR.  - No CKD: Lab Results  Component Value Date   BUN 15 06/26/2014   BUN 18 04/15/2014   Lab Results  Component Value Date   CREATININE 0.59 06/26/2014   CREATININE 0.6 04/15/2014  Not on ACEI/ARB. - No HL: Lab Results  Component Value Date   CHOL 219* 04/15/2014   HDL 40.80 04/15/2014   LDLCALC 142* 04/15/2014   LDLDIRECT 146.2 02/05/2013   TRIG 183.0* 04/15/2014   CHOLHDL 5 04/15/2014  On Pravastatin. - She does have foot pain, likely from back pain.   She also has a history of being bipolar, migraines, and asthma. Also has RA by Dr. Ouida Sills (rheum.) and started on Prednisone 10 mg (03/19/2013), now off. She is now on Plaquenil, too.  Review of Systems Constitutional: no weight gain/loss, no fatigue, no subjective hyperthermia/hypothermia Eyes: no blurry vision, no xerophthalmia ENT: no sore throat, no nodules palpated in throat, no dysphagia/odynophagia, no  hoarseness Cardiovascular: no CP/SOB/palpitations/leg swelling Respiratory: no cough/SOB Gastrointestinal: + N/no V/+ D/no C. Has appt with GI after passing blood clots and having diarrhea.  Musculoskeletal: + both: muscle/joint aches Skin: no rashes Neurological: no tremors/numbness/tingling/dizziness + low libido  I reviewed pt's medications, allergies, PMH, social hx, family hx and no changes required.  Objective:   Physical Exam BP 136/88  Pulse 86  Temp(Src) 98.3 F (36.8 C) (Oral)  Resp 12  Wt 204 lb (92.534 kg)  SpO2 97% Wt Readings from Last 3 Encounters:  07/02/14 204 lb (92.534 kg)  06/10/14 204 lb 11.2 oz (92.851 kg)  06/06/14 206 lb (93.441 kg)  Constitutional: overweight, in NAD Eyes: PERRLA, EOMI, no exophthalmos ENT: moist mucous membranes, no thyromegaly, no cervical lymphadenopathy Cardiovascular: RRR, No MRG Respiratory: CTA B Gastrointestinal: abdomen soft, NT, ND, BS+ Musculoskeletal: no deformities, strength intact in all 4  Assessment:     1. DM2, without apparent complications, uncontrolled, insulin-dependent    Plan:     1. Patient is doing much better after starting the VGo insulin pump, but she would like to stop it as she gets bruising at the insertion sites. She would like to use injections - sent Rx to pharmacy: Patient Instructions  Please stop the VGo. Continue JanuMet 50/1000 mg 2x a day. Start lantus 24 units at bedtime. Start NovoLog 6 units before every meal.  Add the following Sliding scale of NovoLog: - 150-175: + 1 unit  - 176-200: + 2 units  - 201-225: + 3 units  - >225: + 4 units  Please return in 1.5 month with your sugar log.  - she is interested in nutrition classes >> referred. - advised to schedule a new eye exam - I will see her back in 1-1.5 months and I advised her not to forget her sugar log at that time [facility] check a hemoglobin A1c at next visit

## 2014-07-07 ENCOUNTER — Other Ambulatory Visit: Payer: Self-pay | Admitting: Orthopedic Surgery

## 2014-07-07 DIAGNOSIS — M545 Low back pain, unspecified: Secondary | ICD-10-CM

## 2014-07-14 ENCOUNTER — Ambulatory Visit
Admission: RE | Admit: 2014-07-14 | Discharge: 2014-07-14 | Disposition: A | Discharge status: Home / Self Care | Source: Ambulatory Visit | Attending: Orthopedic Surgery | Admitting: Orthopedic Surgery

## 2014-07-14 DIAGNOSIS — M545 Low back pain, unspecified: Secondary | ICD-10-CM

## 2014-07-22 ENCOUNTER — Encounter: Attending: Internal Medicine

## 2014-07-22 VITALS — Ht 65.0 in | Wt 202.4 lb

## 2014-07-22 DIAGNOSIS — Z713 Dietary counseling and surveillance: Secondary | ICD-10-CM | POA: Insufficient documentation

## 2014-07-22 DIAGNOSIS — E1149 Type 2 diabetes mellitus with other diabetic neurological complication: Secondary | ICD-10-CM | POA: Insufficient documentation

## 2014-07-22 DIAGNOSIS — O99810 Abnormal glucose complicating pregnancy: Secondary | ICD-10-CM

## 2014-07-25 NOTE — Progress Notes (Signed)
Patient was seen on 07/22/14 for the first of a series of three diabetes self-management courses at the [facility].  Patient Education Plan per assessed needs and concerns is to attend four course education program for Diabetes Self Management Education.  Current HbA1c: 7.1%  The following learning objectives were met by the patient during this class:  Describe diabetes  State some common risk factors for diabetes  Defines the role of glucose and insulin  Identifies type of diabetes and pathophysiology  Describe the relationship between diabetes and cardiovascular risk  State the members of the Healthcare Team  States the rationale for glucose monitoring  State when to test glucose  State their individual Target Range  State the importance of logging glucose readings  Describe how to interpret glucose readings  Identifies A1C target  Explain the correlation between A1c and eAG values  State symptoms and treatment of high blood glucose  State symptoms and treatment of low blood glucose  Explain proper technique for glucose testing  Identifies proper sharps disposal  Handouts given during class include:  Living Well with Diabetes book  Carb Counting and Meal Planning book  Meal Plan Card  Carbohydrate guide  Meal planning worksheet  Low Sodium Flavoring Tips  The diabetes portion plate  L9T to eAG Conversion Chart  Diabetes Medications  Diabetes Recommended Care Schedule  Support Group  Diabetes Success Plan  Core Class Satisfaction Survey  Follow-Up Plan:  Attend core 2

## 2014-07-29 ENCOUNTER — Ambulatory Visit

## 2014-08-01 ENCOUNTER — Encounter: Payer: Self-pay | Admitting: Family Medicine

## 2014-08-05 ENCOUNTER — Encounter

## 2014-08-05 DIAGNOSIS — E1149 Type 2 diabetes mellitus with other diabetic neurological complication: Secondary | ICD-10-CM

## 2014-08-05 NOTE — Progress Notes (Signed)
Patient was seen on 08/05/14 for the third of a series of three diabetes self-management courses at the [facility]. The following learning objectives were met by the patient during this class:    State the amount of activity recommended for healthy living   Describe activities suitable for individual needs   Identify ways to regularly incorporate activity into daily life   Identify barriers to activity and ways to over come these barriers  Identify diabetes medications being personally used and their primary action for lowering glucose and possible side effects   Describe role of stress on blood glucose and develop strategies to address psychosocial issues   Identify diabetes complications and ways to prevent them  Explain how to manage diabetes during illness   Evaluate success in meeting personal goal   Establish 2-3 goals that they will plan to diligently work on until they return for the  62-monthfollow-up visit  Goals:  Follow Diabetes Meal Plan as instructed  Aim for 15-30 mins of physical activity daily as tolerated  Bring food record and glucose log to your follow up visit  Your patient has established the following 4 month goals in their individualized success plan: I will count my carb choices at most meals and snacks I will increase my activity level at least 60 minutes or more, 3 days a week I will test my glucose at least 2 times a day, 7 days a week I will look at patterns in my record book at least 14 days a month To help manage stress I will do exercise at least 4 times a week  Your patient has identified these potential barriers to change:  Stress  Your patient has identified their diabetes self-care support plan as  [facility]  Family support  On line resources  Plan:  Attend Core 4 in 4 months

## 2014-08-07 ENCOUNTER — Telehealth: Payer: Self-pay | Admitting: Family Medicine

## 2014-08-07 ENCOUNTER — Encounter

## 2014-08-07 DIAGNOSIS — E119 Type 2 diabetes mellitus without complications: Secondary | ICD-10-CM

## 2014-08-07 DIAGNOSIS — E1149 Type 2 diabetes mellitus with other diabetic neurological complication: Secondary | ICD-10-CM | POA: Diagnosis not present

## 2014-08-07 NOTE — Progress Notes (Signed)

## 2014-08-07 NOTE — Telephone Encounter (Signed)
Pt would like to talk with dr Maudie Mercury concerning the pain in her hip. Pt has went to all the referral. Pt is requesting md to call her back. Pt decline appt

## 2014-08-08 NOTE — Telephone Encounter (Signed)
[phone] (home)  Called and LM for patient to call.

## 2014-08-13 ENCOUNTER — Ambulatory Visit: Admitting: Internal Medicine

## 2014-08-13 ENCOUNTER — Telehealth: Payer: Self-pay | Admitting: Internal Medicine

## 2014-08-13 NOTE — Telephone Encounter (Signed)
Patient no showed today's appt. Please advise on followup.  A. No follow up necessary. B. Follow up urgent.Locate patient immediately. C. Follow up necessary. Contact patient and schedule visit within ___ days. D. Follow up advised. Contact patient and schedule within ___ weeks.

## 2014-08-14 NOTE — Telephone Encounter (Signed)
C. 30 days

## 2014-08-18 ENCOUNTER — Ambulatory Visit (INDEPENDENT_AMBULATORY_CARE_PROVIDER_SITE_OTHER): Admitting: Internal Medicine

## 2014-08-18 ENCOUNTER — Encounter: Payer: Self-pay | Admitting: Internal Medicine

## 2014-08-18 ENCOUNTER — Encounter: Payer: Self-pay | Admitting: *Deleted

## 2014-08-18 VITALS — BP 122/64 | HR 86 | Temp 98.4°F | Resp 12 | Wt 205.0 lb

## 2014-08-18 DIAGNOSIS — E1149 Type 2 diabetes mellitus with other diabetic neurological complication: Secondary | ICD-10-CM

## 2014-08-18 LAB — HEMOGLOBIN A1C: Hgb A1c MFr Bld: 10.2 % — ABNORMAL HIGH (ref 4.6–6.5)

## 2014-08-18 MED ORDER — INSULIN GLARGINE 100 UNIT/ML SOLOSTAR PEN: 26.0000 [IU] | PEN_INJECTOR | Freq: Every day | SUBCUTANEOUS | Status: DC

## 2014-08-18 MED ORDER — GLUCOSE BLOOD VI STRP: ORAL_STRIP | Status: DC

## 2014-08-18 NOTE — Patient Instructions (Addendum)
Please increase Lantus to 26 units at bedtime. Continue NovoLog 6 units - take this 15 min before every meal Continue the Sliding scale of NovoLog: - 150-175: + 1 unit  - 176-200: + 2 units  - 201-225: + 3 units  - >225: + 4 units  Continue the JanuMet 50-1000 2x a day (second dose with dinner, not at bedtime)  Please return in 3 months with your sugar log. Please check sugars 2x a day, rotating checks.  Please stop at the lab.

## 2014-08-18 NOTE — Progress Notes (Signed)
Subjective:     Patient ID: Kerri Osborn, female   DOB: 1961/03/26, 53 y.o.   MRN: [id number]  HPI Kerri Osborn is a 53 y.o. woman, returning for f/u for DM2, dx. 6811, without complications, uncontrolled, insulin-dependent since 2012. Last visit 15 mo ago! - sister had an MVA >> she moved to [place] to take care of her.   Her last A1c was: Lab Results  Component Value Date   HGBA1C 7.1* 04/15/2014   HGBA1C 8.0* 02/05/2013   Previous treatment regimen: - VGo 20 + 3 clicks per meal - since last year >> bruising and numbness >> would like switch to injections - JanuMet 50-1000 mg daily bid  She is now on - wanted to come off the VGo as it caused skin changes: - Janumet 50-1000 mg 2x a day - Lantus 24 units at bedtime. - NovoLog 6 units 1h before every meal. - Sliding scale of NovoLog: - 150-175: + 1 unit  - 176-200: + 2 units  - 201-225: + 3 units  - >225: + 4 units   Does not bring a log but brings her meter >> checks 2x a day: - am: 120-190 >> 140s >> 145-155 - 2h after b'fast: n/c - before lunch: 140-160 >> n/c - 2h after lunch: n/c - before dinner: 170-214 >> 160s >> n/c - bedtime: 140-150s No lows. She has hypoglycemia awareness at 60. No lows recently.  She was vegan but was advised by GI to switch to regular diet (diverticulosis on colonoscopy). She is now eating a regular diet.  - Last eye appt: 10/2013. Reportedly, no DR.  - No CKD: Lab Results  Component Value Date   BUN 15 06/26/2014   BUN 18 04/15/2014   Lab Results  Component Value Date   CREATININE 0.59 06/26/2014   CREATININE 0.6 04/15/2014  Not on ACEI/ARB. Last ACR normal: Component     Latest Ref Rng 04/15/2014  Microalb, Ur     0.0 - 1.9 mg/dL 18.1 (H)  Creatinine,U      230.0  MICROALB/CREAT RATIO     0.0 - 30.0 mg/g 7.9   - No HL: Lab Results  Component Value Date   CHOL 219* 04/15/2014   HDL 40.80 04/15/2014   LDLCALC 142* 04/15/2014   LDLDIRECT 146.2 02/05/2013   TRIG 183.0* 04/15/2014    CHOLHDL 5 04/15/2014  On Pravastatin. - She does have foot pain, likely from back pain.   She also has a history of being bipolar, migraines, and asthma. Also has RA by Dr. Ouida Sills (rheum.) and was on Prednisone 10 mg (03/19/2013), now off. She is on Plaquenil.  Review of Systems Constitutional: no weight gain/loss, no fatigue, no subjective hyperthermia/hypothermia Eyes: no blurry vision, no xerophthalmia ENT: no sore throat, no nodules palpated in throat, no dysphagia/odynophagia, no hoarseness Cardiovascular: no CP/SOB/palpitations/leg swelling Respiratory: no cough/SOB Gastrointestinal: no N/no V/+ D/no C  Musculoskeletal: no muscle/+ joint aches (back pain). Skin: no rashes Neurological: no tremors/numbness/tingling/dizziness + low libido  I reviewed pt's medications, allergies, PMH, social hx, family hx and no changes required, except, she changed her Lamictal dose.  Objective:   Physical Exam BP 122/64  Pulse 86  Temp(Src) 98.4 F (36.9 C) (Oral)  Resp 12  Wt 205 lb (92.987 kg)  SpO2 98% Wt Readings from Last 3 Encounters:  08/18/14 205 lb (92.987 kg)  07/25/14 202 lb 6.4 oz (91.808 kg)  07/02/14 204 lb (92.534 kg)  Constitutional: overweight, in NAD Eyes: PERRLA, EOMI,  no exophthalmos ENT: moist mucous membranes, no thyromegaly, no cervical lymphadenopathy Cardiovascular: RRR, No MRG Respiratory: CTA B Gastrointestinal: abdomen soft, NT, ND, BS+ Musculoskeletal: no deformities, strength intact in all 4  Assessment:     1. DM2, without apparent complications, uncontrolled, insulin-dependent    Plan:     1. Patient was doing great on the VGo insulin pump and the vegan diet, but we had to stop the VGo as 2/2 bruising at the insertion sites. She also was advised by GI to come off the vegan diet >> now on regular diet. Sugars a little high in am and she is not checking much later in the day. However, at bedtime, sugars are as in am. I will advise her to increase  Lantus by 2 units and to start taking the mealtime insulin correctly, 15 min before the meals rather than 1h. Patient Instructions  Please increase Lantus to 26 units at bedtime. Continue NovoLog 6 units - move this 15 min before every meal Continue the Sliding scale of NovoLog: - 150-175: + 1 unit  - 176-200: + 2 units  - 201-225: + 3 units  - >225: + 4 units  Continue the JanuMet 50-1000 2x a day (second dose with dinner, not at bedtime) Please return in 3 months with your sugar log. Please check sugars 2x a day, rotating checks. Please stop at the lab. - refilled her strips - will check HbA1c today - I will see her back in 3 months with her sugar log  Office Visit on 08/18/2014  Component Date Value Ref Range Status  . Hemoglobin A1C 08/18/2014 10.2* 4.6 - 6.5 % Final   Glycemic Control Guidelines for People with Diabetes:Non Diabetic:  <6%Goal of Therapy: <7%Additional Action Suggested:  >8%    HbA1c much higher 2/2 switching from VGo and relaxing the diet. Will have her come back in 1.5 mo rather than 3 >> bring her log.

## 2014-08-19 ENCOUNTER — Encounter: Payer: Self-pay | Admitting: Family Medicine

## 2014-08-19 ENCOUNTER — Ambulatory Visit (INDEPENDENT_AMBULATORY_CARE_PROVIDER_SITE_OTHER): Admitting: Family Medicine

## 2014-08-19 VITALS — BP 118/76 | HR 75 | Temp 98.6°F | Ht 65.0 in | Wt 203.0 lb

## 2014-08-19 DIAGNOSIS — IMO0002 Reserved for concepts with insufficient information to code with codable children: Secondary | ICD-10-CM

## 2014-08-19 DIAGNOSIS — R29898 Other symptoms and signs involving the musculoskeletal system: Secondary | ICD-10-CM

## 2014-08-19 DIAGNOSIS — M5416 Radiculopathy, lumbar region: Secondary | ICD-10-CM

## 2014-08-19 DIAGNOSIS — M545 Low back pain, unspecified: Secondary | ICD-10-CM

## 2014-08-19 DIAGNOSIS — M79609 Pain in unspecified limb: Secondary | ICD-10-CM

## 2014-08-19 DIAGNOSIS — M2550 Pain in unspecified joint: Secondary | ICD-10-CM

## 2014-08-19 DIAGNOSIS — M79641 Pain in right hand: Secondary | ICD-10-CM

## 2014-08-19 NOTE — Progress Notes (Signed)
Pre visit review using our clinic review tool, if applicable. No additional management support is needed unless otherwise documented below in the visit note. 

## 2014-08-19 NOTE — Progress Notes (Signed)
No chief complaint on file.   HPI:  Acute visit for:  Back Pain/Leg Pain/Weaknss: -started suddenly in June -long hx of multiple pain complaints and sees Dr. Ouida Sills (rheum) chronically for "arthritis" - he has her on hydroxychloroquine, she has stopped this - they are seeing her in october -developed sudden onset of low back, and entire LE pain, sig out of proportion to exam findings in June -seen in ED, then by me and referred to spine specialist, they did an MRI that showed DDD and advised PT and trigger point inj (declined), neurontin (not helping) and f/u in 6 weeks with MD, Dr. Leonarda Salon, questioned strain and per review of notes did not seem to think MRI findings explained her severe radicular pain -she reports: intermittent burning sharp and stabbing (hot poker) severe pain, continuous 8/10 burning pain and numbness in R low back radiating to the R buttock and R upper posterior leg - l sided pain resolving -R wrist burning pain since last time, but she reports this is chronic and related to her arthritis -reports sometimes legs go weak suddenly and she falls (feels like legs get weak and jiggly) - reports she told the specialist this - this has has happened 3 times, this has not happened in over 1 week -reports specialist told her they didn't know what was causing this and told her to stop the naproxen -better with/worse with: better with nothing, worse with standing -denies: worsening, weakness or numbness in the R hand, bowel or bladder incontinence,malaise, fevers -she has not done the physical therapy - slept through the appt -she is sick and tired of this and doesn't want to take medications if not helping   MRI read from 07/14/2014 IMPRESSION:  1. Mild broad-based disc bulge at L1-2.  2. No significant lumbar spine disc protrusion, foraminal stenosis  or central canal stenosis.  3. At L5-S1 there are prominent vessels surrounding the right  intraspinal L5 nerve root  CT abd  and pelvis from 7/92015: FINDINGS:  1. No acute findings.  2. Colonic diverticula without evidence of diverticulitis or other  bowel inflammation.  3. Mild hepatomegaly and diffuse hepatic steatosis.  4. Status post hysterectomy.    ROS: See pertinent positives and negatives per HPI.  Past Medical History  Diagnosis Date  . Diabetes mellitus   . Bipolar disorder   . Asthma   . Coronary artery disease   . Arthritis   . Dyspnea 04/13/2011    pfts 03/2011:  Normal FEV1 and FEV1/FVC Ct chest 2012:  No PE, normal parenchyma Kerri Osborn 04/18/2011 with active symptoms:  Normal FEV1%, mild decrease in FVC due to restriction vs airtrapping, normal appearing       flow volume loop.    . Depression   . Chicken pox   . Seasonal allergies   . Migraines   . 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 Allergy   . Myocardial infarction   . Diverticulosis   . Fatty liver     Past Surgical History  Procedure Laterality Date  . Abdominal hysterectomy    . Tubal ligation    . Appendectomy    . Myomectomy    . Cesarean section    . Breast cyst excision    . Inner ear surgery      tubes in ears    Family History  Problem Relation Age of Onset  . Allergies Sister   . Allergies Daughter   . Allergies Daughter   .  Asthma Daughter   . Asthma Daughter   . Arthritis Maternal Grandmother   . Hyperlipidemia Maternal Grandmother   . Diabetes Maternal Grandmother   . Hypertension Maternal Aunt   . Sudden death Mother   . Kidney failure Mother   . Sudden death Father   . Mental illness    . Colon cancer Neg Hx   . Rectal cancer Neg Hx   . Stomach cancer Neg Hx     History   Social History  . Marital Status: Married    Spouse Name: N/A    Number of Children: 2  . Years of Education: N/A   Occupational History  . massage therapist and Esthetician    Social History Main Topics  . Smoking status: Never Smoker   . Smokeless tobacco: Never Used  . Alcohol Use: No  . Drug Use: No   . Sexual Activity: None   Other Topics Concern  . 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      Spiritual Beliefs:       Lifestyle: MWF does kickboxing, vegetarian - trying to go vegan             Current outpatient prescriptions:albuterol (PROVENTIL HFA;VENTOLIN HFA) 108 (90 BASE) MCG/ACT inhaler, Inhale 2 puffs into the lungs every 6 (six) hours as needed for wheezing or shortness of breath., Disp: , Rfl: ;  gabapentin (NEURONTIN) 300 MG capsule, Take 300 mg by mouth 2 (two) times daily., Disp: , Rfl: ;  glucose blood test strip, Use 2x a day, Disp: 200 each, Rfl: 3 glucose monitoring kit (FREESTYLE) monitoring kit, 1 each by Does not apply route as needed for other., Disp: , Rfl: ;  insulin aspart (NOVOLOG FLEXPEN) 100 UNIT/ML FlexPen, Inject 6-8 Units into the skin 3 (three) times daily with meals., Disp: 15 mL, Rfl: 1;  Insulin Glargine (LANTUS SOLOSTAR) 100 UNIT/ML Solostar Pen, Inject 26 Units into the skin daily at 10 pm., Disp: 5 pen, Rfl: 6 Insulin Pen Needle (CAREFINE PEN NEEDLES) 32G X 4 MM MISC, Use 4x a day, Disp: 200 each, Rfl: 11;  JANUMET 50-1000 MG per tablet, take 1 tablet by mouth twice a day with meals, Disp: 60 tablet, Rfl: 3;  lamoTRIgine (LAMICTAL) 100 MG tablet, Take 100 mg by mouth 2 (two) times daily., Disp: , Rfl:  oxyCODONE-acetaminophen (PERCOCET/ROXICET) 5-325 MG per tablet, Take 1-2 tablets by mouth every 4 (four) hours as needed for moderate pain or severe pain., Disp: 12 tablet, Rfl: 0;  sertraline (ZOLOFT) 100 MG tablet, Take 100 mg by mouth daily., Disp: , Rfl:   EXAM:  Filed Vitals:   08/19/14 1122  BP: 118/76  Pulse: 75  Temp: 98.6 F (37 C)    Body mass index is 33.78 kg/(m^2).  GENERAL: vitals reviewed and listed above, alert, oriented, appears well hydrated and in no acute distress  HEENT: atraumatic, conjunttiva clear, no obvious  abnormalities on inspection of external nose and ears  NECK: no obvious masses on inspection  LUNGS: clear to auscultation bilaterally, no wheezes, rales or rhonchi, good air movement  CV: HRRR, no peripheral edema  MS: moves all extremities without noticeable abnormality Normal Gait Normal inspection of back, no obvious scoliosis or leg length descrepancy No bony TTP Soft tissue TTP at: R PSIS -/+ tests: neg trendelenburg,-facet loading, -SLRT, -CLRT, +  R FABER, -FADIR Normal muscle strength, sensation to light touch and DTRs in LEs bilaterally  PSYCH: pleasant and cooperative, no obvious depression or anxiety  ASSESSMENT AND PLAN:  Discussed the following assessment and plan:  Hand pain, right  Right low back pain, with sciatica presence unspecified  Lumbar radicular pain  Weakness of right leg  Polyarthralgia  -symptoms have actually improved, no severe sharp stabbing episodes with ? Weakness in over 1 week, only R sided now -symptoms and exam today fit more with sacroiliitis - discussed this and other potential etiologies, advised if neurological in origin working to get her diabetes under control may help -discussed seeing neurologist or PMR and she may if persists or worsens -advised to do the PT recommended by spine specialist and f/u; eval with her rheumatologist and will have my assistant get the last OV notes, basic labs including crp - she declined as running late and reports Dr. Ouida Sills will do this -Patient advised to return or notify a doctor immediately if symptoms worsen or persist or new concerns arise.  Patient Instructions  -schedule the physical therapy  -follow up with spine specialist and your rheumatologist - see if your specialist thinks you need the hydroxychlorquine  -then let me know if issues persist or worsen     Kerri Osborn R.

## 2014-08-19 NOTE — Patient Instructions (Addendum)
-  schedule the physical therapy  -follow up with spine specialist and your rheumatologist - see if your specialist thinks you need the hydroxychlorquine  -then let me know if issues persist or worsen

## 2014-09-10 ENCOUNTER — Encounter: Attending: Internal Medicine | Admitting: *Deleted

## 2014-09-10 DIAGNOSIS — E1149 Type 2 diabetes mellitus with other diabetic neurological complication: Secondary | ICD-10-CM | POA: Insufficient documentation

## 2014-09-10 DIAGNOSIS — Z713 Dietary counseling and surveillance: Secondary | ICD-10-CM | POA: Diagnosis not present

## 2014-09-15 NOTE — Progress Notes (Signed)
  Medical Nutrition Therapy:  Appt start time: 1130 end time:  1200.  Assessment:  Primary concerns today: patient here for follow up after being on V-Go insulin delivery device. She has attended the three Core Diabetes Classes over the past 2 months which she states were very helpful. She has more confidence with her carb counting now and understands many other aspects of her diabetes as well. She is no longer on the V-Go, she is now taking Novolog and Lantus. She SMBG daily with reported range of 97-150 mg/dl. She is planning to attend [employer] with her daughter now too.  MEDICATIONS: see list. Diabetes medications are Lantus and Novolog   DIETARY INTAKE:  Usual eating pattern includes 3 meals and 1-2 snacks per day.  Everyday foods include good variety of all food groups except for meat.  Avoided foods include meats- she was vegetarian, sweets and sodas.    Estimated energy needs: 1400 calories 158 g carbohydrates 105 g protein 39 g fat  Progress Towards Goal(s):  In progress.   Nutritional Diagnosis:  NB-1.1 Food and nutrition-related knowledge deficit As related to diabetes management.  As evidenced by A1c of 10.2% on 08/18/14    Intervention: commended her on her improved eating habits again and encouraged that she increase her activity level.  Plan:  Aim for 2-3 Carb Choices per meal (30-45 grams)   Aim for 0-1 Carbs per snack if hungry  Include protein in moderation with your meals and snacks Consider reading food labels for Total Carbohydrate of foods Consider  increasing your activity level by walking or other gym activities for 15-30 minutes daily as tolerated Continue checking BG at alternate times per day as directed by MD    Handouts given during visit include: No new handouts at this follow up visit  Monitoring/Evaluation:  Dietary intake, exercise, reading food labels, and body weight in 3 months.

## 2014-09-15 NOTE — Patient Instructions (Signed)
Plan:  Aim for 2-3 Carb Choices per meal (30-45 grams)   Aim for 0-1 Carbs per snack if hungry  Include protein in moderation with your meals and snacks Consider reading food labels for Total Carbohydrate of foods Consider  increasing your activity level by walking or other gym activities for 15-30 minutes daily as tolerated Continue checking BG at alternate times per day as directed by MD

## 2014-10-08 ENCOUNTER — Other Ambulatory Visit: Payer: Self-pay | Admitting: *Deleted

## 2014-10-08 ENCOUNTER — Telehealth: Payer: Self-pay | Admitting: Family Medicine

## 2014-10-08 MED ORDER — INSULIN ASPART 100 UNIT/ML FLEXPEN: 6.0000 [IU] | PEN_INJECTOR | Freq: Three times a day (TID) | SUBCUTANEOUS | Status: DC

## 2014-10-08 MED ORDER — FREESTYLE LANCETS MISC: Status: DC

## 2014-10-08 MED ORDER — INSULIN PEN NEEDLE 32G X 4 MM MISC: Status: DC

## 2014-10-08 MED ORDER — GLUCOSE BLOOD VI STRP: ORAL_STRIP | Status: DC

## 2014-10-08 MED ORDER — INSULIN GLARGINE 100 UNIT/ML SOLOSTAR PEN: 26.0000 [IU] | PEN_INJECTOR | Freq: Every day | SUBCUTANEOUS | Status: DC

## 2014-10-08 NOTE — Telephone Encounter (Signed)
[facility] is requesting re-fill on JANUMET 50-1000 MG per tablet

## 2014-10-09 MED ORDER — SITAGLIPTIN PHOS-METFORMIN HCL 50-1000 MG PO TABS: 1.0000 | ORAL_TABLET | Freq: Two times a day (BID) | ORAL | Status: DC

## 2014-10-09 NOTE — Telephone Encounter (Signed)
Rx done. 

## 2014-10-21 ENCOUNTER — Encounter: Payer: Self-pay | Admitting: Family Medicine

## 2014-10-21 ENCOUNTER — Ambulatory Visit (INDEPENDENT_AMBULATORY_CARE_PROVIDER_SITE_OTHER): Admitting: Family Medicine

## 2014-10-21 VITALS — BP 132/80 | HR 90 | Temp 98.0°F | Ht 65.0 in | Wt 200.5 lb

## 2014-10-21 DIAGNOSIS — M545 Low back pain: Secondary | ICD-10-CM

## 2014-10-21 DIAGNOSIS — E1165 Type 2 diabetes mellitus with hyperglycemia: Secondary | ICD-10-CM

## 2014-10-21 DIAGNOSIS — R600 Localized edema: Secondary | ICD-10-CM

## 2014-10-21 DIAGNOSIS — M25531 Pain in right wrist: Secondary | ICD-10-CM

## 2014-10-21 DIAGNOSIS — Z23 Encounter for immunization: Secondary | ICD-10-CM

## 2014-10-21 DIAGNOSIS — IMO0002 Reserved for concepts with insufficient information to code with codable children: Secondary | ICD-10-CM

## 2014-10-21 DIAGNOSIS — F3177 Bipolar disorder, in partial remission, most recent episode mixed: Secondary | ICD-10-CM

## 2014-10-21 DIAGNOSIS — E1140 Type 2 diabetes mellitus with diabetic neuropathy, unspecified: Secondary | ICD-10-CM

## 2014-10-21 NOTE — Progress Notes (Signed)
Pre visit review using our clinic review tool, if applicable. No additional management support is needed unless otherwise documented below in the visit note. 

## 2014-10-21 NOTE — Progress Notes (Signed)
HPI:  Follow up:   DM: -managed by her endocrinologist -medications: insulin -reports doing well, is going to schedule appt with endocrinologist -denies: wounds on feet, vision changes  Bipolar disorder: -managed by psychiatrist - [facility] -meds: lamictal and zoloft -denies: depression, SI  Polyarthralgia/myositis/low back pain: -seeing spine specialist (Dr. Waldron [facility] rheumatologist (Dr. Ouida Sills) -had MRI and CT in 2015 -meds: gabapentin, percocet, lamital - hydroxychloroquine in the past -reports much better and she has not followed up   New Problems:  Knot on R wrist: -pain R radial wrist for 7 months -denies weakness, numbness, malaise  Mild edema bilat LE -intermittent for some time -denies: CP, SOB, DOE, palpitations  ROS: See pertinent positives and negatives per HPI.  Past Medical History  Diagnosis Date  . Diabetes mellitus   . Bipolar disorder   . Asthma   . Coronary artery disease   . Arthritis   . Dyspnea 04/13/2011    pfts 03/2011:  Normal FEV1 and FEV1/FVC Ct chest 2012:  No PE, normal parenchyma [name] 04/18/2011 with active symptoms:  Normal FEV1%, mild decrease in FVC due to restriction vs airtrapping, normal appearing       flow volume loop.    . Depression   . Chicken pox   . Seasonal allergies   . Migraines   . 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 Allergy   . Myocardial infarction   . Diverticulosis   . Fatty liver     Past Surgical History  Procedure Laterality Date  . Abdominal hysterectomy    . Tubal ligation    . Appendectomy    . Myomectomy    . Cesarean section    . Breast cyst excision    . Inner ear surgery      tubes in ears    Family History  Problem Relation Age of Onset  . Allergies Sister   . Allergies Daughter   . Allergies Daughter   . Asthma Daughter   . Asthma Daughter   . Arthritis Maternal Grandmother   . Hyperlipidemia Maternal Grandmother   . Diabetes Maternal Grandmother   . Hypertension  Maternal Aunt   . Sudden death Mother   . Kidney failure Mother   . Sudden death Father   . Mental illness    . Colon cancer Neg Hx   . Rectal cancer Neg Hx   . Stomach cancer Neg Hx     History   Social History  . Marital Status: Married    Spouse Name: N/A    Number of Children: 2  . Years of Education: N/A   Occupational History  . massage therapist and Esthetician    Social History Main Topics  . Smoking status: Never Smoker   . Smokeless tobacco: Never Used  . Alcohol Use: No  . Drug Use: No  . Sexual Activity: None   Other Topics Concern  . 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      Spiritual Beliefs:       Lifestyle: MWF does kickboxing, vegetarian - trying to go vegan             Current outpatient prescriptions: albuterol (PROVENTIL HFA;VENTOLIN HFA) 108 (90 BASE) MCG/ACT inhaler, Inhale 2 puffs into the lungs every 6 (six) hours as needed for wheezing or  shortness of breath., Disp: , Rfl: ;  busPIRone (BUSPAR) 15 MG tablet, Take 15 mg by mouth 2 (two) times daily., Disp: , Rfl: ;  glucose blood (FREESTYLE LITE) test strip, Use to test blood sugar 2 times daily as instructed, Disp: 200 each, Rfl: 3 glucose blood test strip, Use 2x a day, Disp: 200 each, Rfl: 3;  glucose monitoring kit (FREESTYLE) monitoring kit, 1 each by Does not apply route as needed for other., Disp: , Rfl: ;  insulin aspart (NOVOLOG FLEXPEN) 100 UNIT/ML FlexPen, Inject 6-8 Units into the skin 3 (three) times daily with meals., Disp: 15 mL, Rfl: 1 Insulin Glargine (LANTUS SOLOSTAR) 100 UNIT/ML Solostar Pen, Inject 26 Units into the skin daily at 10 pm., Disp: 10 pen, Rfl: 1;  Insulin Pen Needle (CAREFINE PEN NEEDLES) 32G X 4 MM MISC, Use 4x a day, Disp: 400 each, Rfl: 3;  lamoTRIgine (LAMICTAL) 100 MG tablet, Take 100 mg by mouth 2 (two) times daily., Disp: , Rfl: ;  Lancets (FREESTYLE)  lancets, Use to test blood sugar 2 times daily as instructed, Disp: 200 each, Rfl: 3 sertraline (ZOLOFT) 100 MG tablet, Take 100 mg by mouth daily., Disp: , Rfl: ;  sitaGLIPtin-metformin (JANUMET) 50-1000 MG per tablet, Take 1 tablet by mouth 2 (two) times daily with a meal., Disp: 60 tablet, Rfl: 1  EXAM:  Filed Vitals:   10/21/14 0827  BP: 132/80  Pulse: 90  Temp: 98 F (36.7 C)    Body mass index is 33.36 kg/(m^2).  GENERAL: vitals reviewed and listed above, alert, oriented, appears well hydrated and in no acute distress  HEENT: atraumatic, conjunttiva clear, no obvious abnormalities on inspection of external nose and ears  NECK: no obvious masses on inspection  LUNGS: clear to auscultation bilaterally, no wheezes, rales or rhonchi, good air movement  CV: HRRR, no peripheral edema  MS: moves all extremities without noticeable abnormality, no abnormality on visual examination of the wrists, no loss of sensation or weakness or swelling, TT in R radial wrist pain  PSYCH: pleasant and cooperative, no obvious depression or anxiety  ASSESSMENT AND PLAN:  Discussed the following assessment and plan:  Low back pain without sciatica, unspecified back pain laterality -resolved, advised she see the back doctor if this recurs  Right wrist pain -advised she see her rheumatologist  DM type 2, uncontrolled, with neuropathy -managed by endocrinologist, stressed importance of diet and exercise  Bipolar disorder, in partial remission, most recent episode mixed -followed by pscyh  Lower leg edema -suspect mild venous insufficiency -advised elevation and compression, low sodium  -Patient advised to return or notify a doctor immediately if symptoms worsen or persist or new concerns arise.  Patient Instructions  BEFORE YOU LEAVE: -fasting lab appt -appt with Dr. Renne Crigler  -schedule appointment with your rheumatologist about the wrist pain   -use compression stockings as needed  and elevate legs - follow up if worsens or other symptoms; avoid sodium  We recommend the following healthy lifestyle measures: - eat a healthy diet consisting of lots of vegetables, fruits, beans, nuts, seeds, healthy meats such as white chicken and fish and whole grains.  - avoid fried foods, fast food, processed foods, sodas, red meet and other fattening foods.  - get a least 150 minutes of aerobic exercise per week.       Colin Benton R. '

## 2014-10-21 NOTE — Patient Instructions (Addendum)
BEFORE YOU LEAVE: -fasting lab appt -appt with Dr. Renne Crigler  -schedule appointment with your rheumatologist about the wrist pain   -use compression stockings as needed and elevate legs - follow up if worsens or other symptoms; avoid sodium  We recommend the following healthy lifestyle measures: - eat a healthy diet consisting of lots of vegetables, fruits, beans, nuts, seeds, healthy meats such as white chicken and fish and whole grains.  - avoid fried foods, fast food, processed foods, sodas, red meet and other fattening foods.  - get a least 150 minutes of aerobic exercise per week.

## 2014-11-11 ENCOUNTER — Encounter: Payer: Self-pay | Admitting: Family Medicine

## 2014-11-17 ENCOUNTER — Ambulatory Visit (INDEPENDENT_AMBULATORY_CARE_PROVIDER_SITE_OTHER): Admitting: Internal Medicine

## 2014-11-17 ENCOUNTER — Encounter: Payer: Self-pay | Admitting: Internal Medicine

## 2014-11-17 VITALS — BP 156/94 | HR 77 | Temp 98.5°F | Resp 16 | Ht 65.0 in | Wt 203.0 lb

## 2014-11-17 DIAGNOSIS — IMO0001 Reserved for inherently not codable concepts without codable children: Secondary | ICD-10-CM

## 2014-11-17 DIAGNOSIS — E1165 Type 2 diabetes mellitus with hyperglycemia: Secondary | ICD-10-CM

## 2014-11-17 LAB — HEMOGLOBIN A1C: HEMOGLOBIN A1C: 7.2 % — AB (ref 4.6–6.5)

## 2014-11-17 MED ORDER — INSULIN PEN NEEDLE 32G X 4 MM MISC: Status: DC

## 2014-11-17 MED ORDER — INSULIN ASPART 100 UNIT/ML FLEXPEN
6.0000 [IU] | PEN_INJECTOR | Freq: Three times a day (TID) | SUBCUTANEOUS | Status: DC
Start: 2014-11-17 — End: 2016-01-19

## 2014-11-17 MED ORDER — SITAGLIPTIN PHOS-METFORMIN HCL 50-1000 MG PO TABS: 1.0000 | ORAL_TABLET | Freq: Two times a day (BID) | ORAL | Status: DC

## 2014-11-17 MED ORDER — BUSPIRONE HCL 15 MG PO TABS: ORAL_TABLET | ORAL | Status: AC

## 2014-11-17 MED ORDER — ALBUTEROL SULFATE HFA 108 (90 BASE) MCG/ACT IN AERS: 2.0000 | INHALATION_SPRAY | Freq: Four times a day (QID) | RESPIRATORY_TRACT | Status: DC | PRN

## 2014-11-17 MED ORDER — INSULIN GLARGINE 100 UNIT/ML SOLOSTAR PEN: 26.0000 [IU] | PEN_INJECTOR | Freq: Every day | SUBCUTANEOUS | Status: DC

## 2014-11-17 MED ORDER — LAMOTRIGINE 100 MG PO TABS: 100.0000 mg | ORAL_TABLET | Freq: Every day | ORAL | Status: AC

## 2014-11-17 MED ORDER — SERTRALINE HCL 100 MG PO TABS: 100.0000 mg | ORAL_TABLET | Freq: Every day | ORAL | Status: AC

## 2014-11-17 NOTE — Progress Notes (Signed)
Subjective:     Patient ID: Kerri Osborn, female   DOB: 10-Apr-1961, 53 y.o.   MRN: [id number]  HPI [name] is a 53 y.o. woman, returning for f/u for DM2, dx. 0350, without complications, uncontrolled, insulin-dependent since 2012. Last visit 3 mo ago.  Her last A1c was: Lab Results  Component Value Date   HGBA1C 10.2* 08/18/2014   HGBA1C 7.1* 04/15/2014   HGBA1C 8.0* 02/05/2013   Previous treatment regimen: - VGo 20 + 3 clicks per meal - since last year >> bruising and numbness >> would like switch to injections - JanuMet 50-1000 mg daily bid  She is now on - wanted to come off the VGo as it caused skin changes: - Janumet 50-1000 mg 2x a day - Lantus 24 >> 26 units at bedtime. - NovoLog 6 units 15 min before every meal - 3x a day - Sliding scale of NovoLog - does not need to use it lately: - 150-175: + 1 unit  - 176-200: + 2 units  - 201-225: + 3 units  - >225: + 4 units   Does not bring a log but brings her meter >> checks 2x a day: - am: 120-190 >> 140s >> 145-155 >> 80-110 - 2h after b'fast: n/c >> highest 165 (pancakes) - before lunch: 140-160 >> n/c - 2h after lunch: n/c >> 108-120 - before dinner: 170-214 >> 160s >> n/c - bedtime: 140-150s >> n/c No lows. She has hypoglycemia awareness at 60. No lows recently.  She was vegan but was advised by GI to switch to regular diet (diverticulosis on colonoscopy). She is now eating a regular diet.  - Last eye appt: 10/2013. Reportedly, no DR.  - No CKD: Lab Results  Component Value Date   BUN 15 06/26/2014   BUN 18 04/15/2014   Lab Results  Component Value Date   CREATININE 0.59 06/26/2014   CREATININE 0.6 04/15/2014  Not on ACEI/ARB. Last ACR normal: Component     Latest Ref Rng 04/15/2014  Microalb, Ur     0.0 - 1.9 mg/dL 18.1 (H)  Creatinine,U      230.0  MICROALB/CREAT RATIO     0.0 - 30.0 mg/g 7.9   - No HL: Lab Results  Component Value Date   CHOL 219* 04/15/2014   HDL 40.80 04/15/2014    LDLCALC 142* 04/15/2014   LDLDIRECT 146.2 02/05/2013   TRIG 183.0* 04/15/2014   CHOLHDL 5 04/15/2014  On Pravastatin. - She does have foot pain, likely from back pain.  - last eye exam: 2014  She also has a history of being bipolar, migraines, and asthma. Also has RA by Dr. Ouida Sills (rheum.) and was on Prednisone 10 mg (03/19/2013), now off. She is off Plaquenil.  Review of Systems Constitutional: + weight loss, no fatigue, no subjective hyperthermia/hypothermia Eyes: no blurry vision, no xerophthalmia ENT: no sore throat, no nodules palpated in throat, no dysphagia/odynophagia, no hoarseness Cardiovascular: no CP/SOB/palpitations/leg swelling Respiratory: no cough/SOB Gastrointestinal: no N/V/D/no C  Musculoskeletal: no muscle/+ joint aches (back pain). Skin: no rashes Neurological: no tremors/numbness/tingling/dizziness  I reviewed pt's medications, allergies, PMH, social hx, family hx and no changes required, except, she changed her Lamictal dose.  Objective:   Physical Exam Ht 5\' 5"  (1.651 m) Wt Readings from Last 3 Encounters:  10/21/14 200 lb 8 oz (90.946 kg)  08/19/14 203 lb (92.08 kg)  08/18/14 205 lb (92.987 kg)  Constitutional: overweight, in NAD Eyes: PERRLA, EOMI, no exophthalmos ENT: moist mucous  membranes, no thyromegaly, no cervical lymphadenopathy Cardiovascular: RRR, No MRG Respiratory: CTA B Gastrointestinal: abdomen soft, NT, ND, BS+ Musculoskeletal: no deformities, strength intact in all 4  Assessment:     1. DM2, without apparent complications, uncontrolled, insulin-dependent    Plan:     1. Patient was doing great on the VGo insulin pump and the vegan diet, but we had to stop the VGo as 2/2 bruising at the insertion sites. She also was advised by GI to come off the vegan diet >> now on regular diet. Sugars improved since last visit. She does not rotate check times so it is difficult to know how her sugars are doing after meals. Patient Instructions   Please continue Lantus 26 units at bedtime. Continue NovoLog 6 units - 15 min before every meal Continue the Sliding scale of NovoLog: - 150-175: + 1 unit  - 176-200: + 2 units  - 201-225: + 3 units  - >225: + 4 units  Continue the JanuMet 50-1000 2x a day (second dose with dinner, not at bedtime) Please return in 3 months with your sugar log. Please check sugars 2x a day, rotating checks. Please stop at the lab. - will check HbA1c today - had the flu vaccine this season - [facility] - advised to schedule new eye exam. - I will see her back in 3 months with her sugar log  Office Visit on 11/17/2014  Component Date Value Ref Range Status  . Hgb A1c MFr Bld 11/17/2014 7.2* 4.6 - 6.5 % Final   Glycemic Control Guidelines for People with Diabetes:Non Diabetic:  <6%Goal of Therapy: <7%Additional Action Suggested:  >8%    HbA1c MUCH BETTER!

## 2014-11-17 NOTE — Patient Instructions (Signed)
Please continue Lantus 26 units at bedtime. Continue NovoLog 6 units - move this 15 min before every meal Continue the Sliding scale of NovoLog: - 150-175: + 1 unit  - 176-200: + 2 units  - 201-225: + 3 units  - >225: + 4 units  Continue the JanuMet 50-1000 2x a day (second dose with dinner, not at bedtime) Please return in 3 months with your sugar log. Please check sugars 2x a day, rotating checks. Please stop at the lab.

## 2014-11-24 ENCOUNTER — Encounter: Attending: Internal Medicine

## 2014-11-24 DIAGNOSIS — E1165 Type 2 diabetes mellitus with hyperglycemia: Secondary | ICD-10-CM | POA: Diagnosis not present

## 2014-11-24 DIAGNOSIS — Z713 Dietary counseling and surveillance: Secondary | ICD-10-CM | POA: Diagnosis not present

## 2014-11-24 DIAGNOSIS — IMO0002 Reserved for concepts with insufficient information to code with codable children: Secondary | ICD-10-CM

## 2014-11-24 DIAGNOSIS — E1140 Type 2 diabetes mellitus with diabetic neuropathy, unspecified: Secondary | ICD-10-CM | POA: Diagnosis present

## 2014-11-24 DIAGNOSIS — Z794 Long term (current) use of insulin: Secondary | ICD-10-CM | POA: Insufficient documentation

## 2014-11-24 NOTE — Progress Notes (Signed)
Appt start time: 0900 end time:  1000.  Patient was seen on 11/24/2014 for a review of the series of three diabetes self-management courses at the [facility]. The following learning objectives were met by the patient during this class:  . Reviewed blood glucose monitoring and interpretation including the recommended target ranges and Hgb A1c.  . Reviewed on carb counting, importance of regularly scheduled meals/snacks, and meal planning.  . Reviewed the effects of physical activity on glucose levels and long-term glucose control.  Recommended goal of 150 minutes of physical activity/week. . Reviewed patient medications and discussed role of medication on blood glucose and possible side effects. . Discussed strategies to manage stress, psychosocial issues, and other obstacles to diabetes management. . Encouraged moderate weight reduction to improve glucose levels.   . Reviewed short-term complications: hyper- and hypo-glycemia.  Discussed causes, symptoms, and treatment options. . Reviewed prevention, detection, and treatment of long-term complications.  Discussed the role of prolonged elevated glucose levels on body systems.  Goals:  Follow Diabetes Meal Plan as instructed  Eat 3 meals and 2 snacks, every 3-5 hrs  Limit carbohydrate intake to 45 grams carbohydrate/meal Limit carbohydrate intake to 15 grams carbohydrate/snack Add lean protein foods to meals/snacks  Monitor glucose levels as instructed by your doctor  Aim for goal of 15-30 mins of physical activity daily as tolerated  Bring food record and glucose log to your next nutrition visit   

## 2014-11-27 ENCOUNTER — Ambulatory Visit

## 2015-01-06 ENCOUNTER — Other Ambulatory Visit: Payer: Self-pay | Admitting: Internal Medicine

## 2015-01-13 ENCOUNTER — Ambulatory Visit (INDEPENDENT_AMBULATORY_CARE_PROVIDER_SITE_OTHER): Admitting: Family Medicine

## 2015-01-13 ENCOUNTER — Encounter: Payer: Self-pay | Admitting: Family Medicine

## 2015-01-13 VITALS — BP 122/86 | HR 78 | Temp 97.8°F | Ht 65.0 in | Wt 204.8 lb

## 2015-01-13 DIAGNOSIS — E1165 Type 2 diabetes mellitus with hyperglycemia: Secondary | ICD-10-CM

## 2015-01-13 DIAGNOSIS — IMO0002 Reserved for concepts with insufficient information to code with codable children: Secondary | ICD-10-CM

## 2015-01-13 DIAGNOSIS — E1140 Type 2 diabetes mellitus with diabetic neuropathy, unspecified: Secondary | ICD-10-CM

## 2015-01-13 DIAGNOSIS — N644 Mastodynia: Secondary | ICD-10-CM

## 2015-01-13 MED ORDER — SITAGLIPTIN PHOS-METFORMIN HCL 50-1000 MG PO TABS: 1.0000 | ORAL_TABLET | Freq: Two times a day (BID) | ORAL | Status: DC

## 2015-01-13 NOTE — Progress Notes (Signed)
Pre visit review using our clinic review tool, if applicable. No additional management support is needed unless otherwise documented below in the visit note. 

## 2015-01-13 NOTE — Patient Instructions (Signed)
-  We placed a referral for you as discussed for your mammogram. It usually takes about 1-2 weeks to process and schedule this referral. If you have not heard from [place] regarding this appointment in 2 weeks please contact our office.  - I think the pain may more likely be in the muscle - if this persists in 2-3 weeks please follow up with [place]  -call your pharmacy and diabetes doctor about the [place] if issues refilling. In the interim please use the prescription provided.

## 2015-01-13 NOTE — Addendum Note (Signed)
Addended by: Agnes Lawrence on: 01/13/2015 01:29 PM   Modules accepted: Medications

## 2015-01-13 NOTE — Progress Notes (Addendum)
HPI:  Acute visit for:  Pain in L axillary area: -can't think of inciting event, she thought she felt a lump here -denies: erythema, swelling, drainage, nipple discharge, fever -needs screening mammo but with pain wanted eval first  DM: -reports issue with getting [facility] and out -wants rx for local pharmacy while she contacts mail pharmacy and endo to sort out -increased BS since ran out -no wound or fevers or hypoglycemia  ROS: See pertinent positives and negatives per HPI.  Past Medical History  Diagnosis Date  . Diabetes mellitus   . Bipolar disorder   . Asthma   . Coronary artery disease   . Arthritis   . Dyspnea 04/13/2011    pfts 03/2011:  Normal FEV1 and FEV1/FVC Ct chest 2012:  No PE, normal parenchyma [name] 04/18/2011 with active symptoms:  Normal FEV1%, mild decrease in FVC due to restriction vs airtrapping, normal appearing       flow volume loop.    . Depression   . Chicken pox   . Seasonal allergies   . Migraines   . 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 Allergy   . Myocardial infarction   . Diverticulosis   . Fatty liver     Past Surgical History  Procedure Laterality Date  . Abdominal hysterectomy    . Tubal ligation    . Appendectomy    . Myomectomy    . Cesarean section    . Breast cyst excision    . Inner ear surgery      tubes in ears    Family History  Problem Relation Age of Onset  . Allergies Sister   . Allergies Daughter   . Allergies Daughter   . Asthma Daughter   . Asthma Daughter   . Arthritis Maternal Grandmother   . Hyperlipidemia Maternal Grandmother   . Diabetes Maternal Grandmother   . Hypertension Maternal Aunt   . Sudden death Mother   . Kidney failure Mother   . Sudden death Father   . Mental illness    . Colon cancer Neg Hx   . Rectal cancer Neg Hx   . Stomach cancer Neg Hx     History   Social History  . Marital Status: Married    Spouse Name: N/A    Number of Children: 2   . Years of Education: N/A   Occupational History  . massage therapist and Esthetician    Social History Main Topics  . Smoking status: Never Smoker   . Smokeless tobacco: Never Used  . Alcohol Use: No  . Drug Use: No  . Sexual Activity: Not on file   Other Topics Concern  . 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      Spiritual Beliefs:       Lifestyle: MWF does kickboxing, vegetarian - trying to go vegan              Current outpatient prescriptions:  .  albuterol (PROVENTIL HFA;VENTOLIN HFA) 108 (90 BASE) MCG/ACT inhaler, Inhale 2 puffs into the lungs every 6 (six) hours as needed for wheezing or shortness of breath., Disp: 18 g, Rfl: 5 .  busPIRone (BUSPAR) 15 MG tablet, Take 1/2 tablet by mouth 2 times daily., Disp: 90 tablet, Rfl: 1 .  glucose blood (FREESTYLE LITE) test strip, Use to test blood sugar 2 times daily as instructed, Disp: 200 each, Rfl: 3 .  glucose blood test strip, Use 2x a day, Disp: 200 each, Rfl: 3 .  glucose monitoring kit (FREESTYLE) monitoring kit, 1 each by Does not apply route as needed for other., Disp: , Rfl:  .  insulin aspart (NOVOLOG FLEXPEN) 100 UNIT/ML FlexPen, Inject 6-8 Units into the skin 3 (three) times daily with meals., Disp: 15 mL, Rfl: 5 .  Insulin Glargine (LANTUS SOLOSTAR) 100 UNIT/ML Solostar Pen, Inject 26 Units into the skin daily at 10 pm., Disp: 5 pen, Rfl: 2 .  Insulin Pen Needle (CAREFINE PEN NEEDLES) 32G X 4 MM MISC, Use 4x a day, Disp: 400 each, Rfl: 3 .  lamoTRIgine (LAMICTAL) 100 MG tablet, Take 1 tablet (100 mg total) by mouth daily., Disp: 90 tablet, Rfl: 1 .  Lancets (FREESTYLE) lancets, Use to test blood sugar 2 times daily as instructed, Disp: 200 each, Rfl: 3 .  NOVOLOG FLEXPEN 100 UNIT/ML FlexPen, INJECT 6 TO 8 UNITS UNDER THE SKIN THREE TIMES A DAY WITH MEALS, Disp: 5 pen, Rfl: 0 .  sertraline  (ZOLOFT) 100 MG tablet, Take 1 tablet (100 mg total) by mouth daily., Disp: 90 tablet, Rfl: 1 .  sitaGLIPtin-metformin (JANUMET) 50-1000 MG per tablet, Take 1 tablet by mouth 2 (two) times daily with a meal., Disp: 60 tablet, Rfl: 0  EXAM:  There were no vitals filed for this visit.  There is no weight on file to calculate BMI.  GENERAL: vitals reviewed and listed above, alert, oriented, appears well hydrated and in no acute distress  HEENT: atraumatic, conjunttiva clear, no obvious abnormalities on inspection of external nose and ears  BREAST/AXILLARY: mild TTP aprox 7cm inferior to mid axilla and slightly post in superficial tissues, no mass palpated, o/w normal exam  CV: HRRR, no peripheral edema  MS: moves all extremities without noticeable abnormality  PSYCH: pleasant and cooperative, no obvious depression or anxiety  ASSESSMENT AND PLAN:  Discussed the following assessment and plan:  Breast pain - Plan: MM Digital Diagnostic Bilat -likely in SA muscle, but diagnostic mammo ordered, she is instructed to follow up if persists  DM type 2, uncontrolled, with neuropathy -printed rx provided per her request  -Patient advised to return or notify a doctor immediately if symptoms worsen or persist or new concerns arise.  There are no Patient Instructions on file for this visit.   Colin Benton R.

## 2015-01-16 ENCOUNTER — Other Ambulatory Visit: Payer: Self-pay | Admitting: Family Medicine

## 2015-01-16 DIAGNOSIS — N644 Mastodynia: Secondary | ICD-10-CM

## 2015-01-21 ENCOUNTER — Other Ambulatory Visit: Payer: Self-pay

## 2015-01-21 ENCOUNTER — Other Ambulatory Visit: Payer: Self-pay | Admitting: Family Medicine

## 2015-01-21 DIAGNOSIS — N644 Mastodynia: Secondary | ICD-10-CM

## 2015-01-22 ENCOUNTER — Ambulatory Visit
Admission: RE | Admit: 2015-01-22 | Discharge: 2015-01-22 | Disposition: A | Discharge status: Home / Self Care | Source: Ambulatory Visit | Attending: Family Medicine | Admitting: Family Medicine

## 2015-01-22 DIAGNOSIS — N644 Mastodynia: Secondary | ICD-10-CM

## 2015-01-23 ENCOUNTER — Encounter: Payer: Self-pay | Admitting: Family Medicine

## 2015-01-23 ENCOUNTER — Encounter (HOSPITAL_COMMUNITY): Payer: Self-pay | Admitting: Internal Medicine

## 2015-01-23 NOTE — Progress Notes (Signed)
Form for parking placard completed. Pt reports flares of RA causing severe limitation in ability to ambulate - treated by her rheumatologist.

## 2015-02-16 ENCOUNTER — Ambulatory Visit (INDEPENDENT_AMBULATORY_CARE_PROVIDER_SITE_OTHER): Admitting: Internal Medicine

## 2015-02-16 ENCOUNTER — Encounter: Payer: Self-pay | Admitting: Internal Medicine

## 2015-02-16 VITALS — BP 104/62 | HR 90 | Temp 98.1°F | Resp 12 | Wt 201.0 lb

## 2015-02-16 DIAGNOSIS — IMO0002 Reserved for concepts with insufficient information to code with codable children: Secondary | ICD-10-CM

## 2015-02-16 DIAGNOSIS — E1165 Type 2 diabetes mellitus with hyperglycemia: Secondary | ICD-10-CM

## 2015-02-16 DIAGNOSIS — E1140 Type 2 diabetes mellitus with diabetic neuropathy, unspecified: Secondary | ICD-10-CM

## 2015-02-16 NOTE — Progress Notes (Signed)
Subjective:     Patient ID: Kerri Osborn, female   DOB: 1961/06/19, 54 y.o.   MRN: [id number]  HPI Kerri Osborn is a 54 y.o. woman, returning for f/u for DM2, dx. 5188, without complications, uncontrolled, insulin-dependent since 2012. Last visit 3 mo ago.  Her last A1c was: Lab Results  Component Value Date   HGBA1C 7.2* 11/17/2014   HGBA1C 10.2* 08/18/2014   HGBA1C 7.1* 04/15/2014   Previous treatment regimen: - VGo 20 + 3 clicks per meal - since last year >> bruising and numbness >> would like switch to injections - JanuMet 50-1000 mg daily bid  She is now on - wanted to come off the VGo as it caused skin changes: - Janumet 50-1000 mg 2x a day - Lantus 24 >> 26 units at bedtime. - NovoLog 6 units 15 min before every meal - 3x a day - Sliding scale of NovoLog - does not need to use it lately: - 150-175: + 1 unit  - 176-200: + 2 units  - 201-225: + 3 units  - >225: + 4 units   She was out of meds b/c they were not send to her - for 2 weeks at the end of January- beginning of this month (no insulin, JanuMet or test strips).  Does not bring a log >> checks 2x a day: - am: 120-190 >> 140s >> 145-155 >> 80-110 >> 90-97 - 2h after b'fast: n/c >> highest 165 (pancakes) >. n/c - before lunch: 140-160 >> n/c - 2h after lunch: n/c >> 108-120 >> n/c - before dinner: 170-214 >> 160s >> n/c >> 100-103 - bedtime: 140-150s >> n/c No lows. She has hypoglycemia awareness at 60. No lows recently.  She was vegan but was advised by GI to switch to regular diet (diverticulosis on colonoscopy). She is now eating a regular diet.  - Last eye appt: 10/2013. Reportedly, no DR.  - No CKD: Lab Results  Component Value Date   BUN 15 06/26/2014   BUN 18 04/15/2014   Lab Results  Component Value Date   CREATININE 0.59 06/26/2014   CREATININE 0.6 04/15/2014  Not on ACEI/ARB. Last ACR normal: Component     Latest Ref Rng 04/15/2014  Microalb, Ur     0.0 - 1.9 mg/dL 18.1 (H)   Creatinine,U      230.0  MICROALB/CREAT RATIO     0.0 - 30.0 mg/g 7.9   - No HL: Lab Results  Component Value Date   CHOL 219* 04/15/2014   HDL 40.80 04/15/2014   LDLCALC 142* 04/15/2014   LDLDIRECT 146.2 02/05/2013   TRIG 183.0* 04/15/2014   CHOLHDL 5 04/15/2014  On Pravastatin. - She does have foot pain, likely from back pain.   She also has a history of being bipolar, migraines, and asthma. Also has RA by Dr. Ouida Sills (rheum.).  Review of Systems Constitutional: no weight gain, no fatigue, + hot flushes Eyes: no blurry vision, no xerophthalmia ENT: no sore throat, no nodules palpated in throat, no dysphagia/odynophagia, no hoarseness Cardiovascular: no CP/SOB/palpitations/leg swelling Respiratory: no cough/SOB Gastrointestinal: no N/V/D/no C  Musculoskeletal: no muscle/+ joint pain Skin: no rashes Neurological: no tremors/numbness/tingling/dizziness  I reviewed pt's medications, allergies, PMH, social hx, family hx and no changes required, except, she changed her Lamictal dose and started exercise.  Objective:   Physical Exam BP 104/62 mmHg  Pulse 90  Temp(Src) 98.1 F (36.7 C) (Oral)  Resp 12  Wt 201 lb (91.173 kg)  SpO2 99%  Body mass index is 33.45 kg/(m^2). Wt Readings from Last 3 Encounters:  02/16/15 201 lb (91.173 kg)  01/13/15 204 lb 12.8 oz (92.897 kg)  11/17/14 203 lb (92.08 kg)  Constitutional: overweight, in NAD Eyes: PERRLA, EOMI, no exophthalmos ENT: moist mucous membranes, no thyromegaly, no cervical lymphadenopathy Cardiovascular: RRR, No MRG Respiratory: CTA B Gastrointestinal: abdomen soft, NT, ND, BS+ Musculoskeletal: no deformities, strength intact in all 4  Assessment:     1. DM2, without complications, uncontrolled, insulin-dependent    Plan:     1. Patient doing great on her insulin regimen, except a period of 2 weeks when she was off insulin 2/2 not being sent by [facility]. Will not change regimen:  Patient Instructions   Please continue Lantus 26 units at bedtime. Continue NovoLog 6 units - 15 min before every meal Continue the Sliding scale of NovoLog: - 150-175: + 1 unit  - 176-200: + 2 units  - 201-225: + 3 units  - >225: + 4 units  Continue the JanuMet 50-1000 2x a day.  Please return in 3 months (from the day you come for labs) with your sugar log. Please check sugars 2x a day, rotating checks.  Please come back for a HbA1c check this week.  Please schedule a new eye exam.  - will check HbA1c this week (not 3 mo yet) - had the flu vaccine this season - advised to schedule new eye exam. - I will see her back in 3 months with her sugar log

## 2015-02-16 NOTE — Patient Instructions (Signed)
Please continue Lantus 26 units at bedtime. Continue NovoLog 6 units - 15 min before every meal Continue the Sliding scale of NovoLog: - 150-175: + 1 unit  - 176-200: + 2 units  - 201-225: + 3 units  - >225: + 4 units  Continue the JanuMet 50-1000 2x a day.  Please return in 3 months (from the day you come for labs) with your sugar log. Please check sugars 2x a day, rotating checks.  Please come back for a HbA1c check this week.  Please schedule a new eye exam.

## 2015-02-20 ENCOUNTER — Other Ambulatory Visit

## 2015-03-03 ENCOUNTER — Other Ambulatory Visit

## 2015-03-19 LAB — HM DIABETES EYE EXAM

## 2015-03-26 ENCOUNTER — Other Ambulatory Visit: Payer: Self-pay | Admitting: Family Medicine

## 2015-03-27 ENCOUNTER — Other Ambulatory Visit: Payer: Self-pay | Admitting: *Deleted

## 2015-03-27 MED ORDER — SITAGLIPTIN PHOS-METFORMIN HCL 50-1000 MG PO TABS: 1.0000 | ORAL_TABLET | Freq: Two times a day (BID) | ORAL | Status: DC

## 2015-04-13 ENCOUNTER — Other Ambulatory Visit: Payer: Self-pay | Admitting: Internal Medicine

## 2015-05-14 ENCOUNTER — Ambulatory Visit: Admitting: Family Medicine

## 2015-05-20 ENCOUNTER — Ambulatory Visit: Admitting: Internal Medicine

## 2015-05-20 ENCOUNTER — Telehealth: Payer: Self-pay | Admitting: Endocrinology

## 2015-05-20 DIAGNOSIS — Z0289 Encounter for other administrative examinations: Secondary | ICD-10-CM

## 2015-05-20 NOTE — Telephone Encounter (Signed)
error 

## 2015-06-02 ENCOUNTER — Emergency Department (HOSPITAL_COMMUNITY)
Admission: EM | Admit: 2015-06-02 | Discharge: 2015-06-02 | Disposition: A | Discharge status: Home / Self Care | Attending: Emergency Medicine | Admitting: Emergency Medicine

## 2015-06-02 ENCOUNTER — Encounter (HOSPITAL_COMMUNITY): Payer: Self-pay

## 2015-06-02 ENCOUNTER — Emergency Department (HOSPITAL_COMMUNITY)

## 2015-06-02 DIAGNOSIS — Z8611 Personal history of tuberculosis: Secondary | ICD-10-CM | POA: Insufficient documentation

## 2015-06-02 DIAGNOSIS — M1990 Unspecified osteoarthritis, unspecified site: Secondary | ICD-10-CM | POA: Insufficient documentation

## 2015-06-02 DIAGNOSIS — I252 Old myocardial infarction: Secondary | ICD-10-CM | POA: Insufficient documentation

## 2015-06-02 DIAGNOSIS — Z79899 Other long term (current) drug therapy: Secondary | ICD-10-CM | POA: Diagnosis not present

## 2015-06-02 DIAGNOSIS — Z794 Long term (current) use of insulin: Secondary | ICD-10-CM | POA: Diagnosis not present

## 2015-06-02 DIAGNOSIS — E119 Type 2 diabetes mellitus without complications: Secondary | ICD-10-CM | POA: Diagnosis not present

## 2015-06-02 DIAGNOSIS — Z8719 Personal history of other diseases of the digestive system: Secondary | ICD-10-CM | POA: Insufficient documentation

## 2015-06-02 DIAGNOSIS — Z8619 Personal history of other infectious and parasitic diseases: Secondary | ICD-10-CM | POA: Diagnosis not present

## 2015-06-02 DIAGNOSIS — Z880 Allergy status to penicillin: Secondary | ICD-10-CM | POA: Insufficient documentation

## 2015-06-02 DIAGNOSIS — I2510 Atherosclerotic heart disease of native coronary artery without angina pectoris: Secondary | ICD-10-CM | POA: Diagnosis not present

## 2015-06-02 DIAGNOSIS — R51 Headache: Secondary | ICD-10-CM | POA: Diagnosis present

## 2015-06-02 DIAGNOSIS — R42 Dizziness and giddiness: Secondary | ICD-10-CM | POA: Diagnosis not present

## 2015-06-02 DIAGNOSIS — J45909 Unspecified asthma, uncomplicated: Secondary | ICD-10-CM | POA: Diagnosis not present

## 2015-06-02 DIAGNOSIS — G43909 Migraine, unspecified, not intractable, without status migrainosus: Secondary | ICD-10-CM | POA: Insufficient documentation

## 2015-06-02 LAB — CBC WITH DIFFERENTIAL/PLATELET
Basophils Absolute: 0 10*3/uL (ref 0.0–0.1)
Basophils Relative: 0 % (ref 0–1)
Eosinophils Absolute: 0.1 10*3/uL (ref 0.0–0.7)
Eosinophils Relative: 1 % (ref 0–5)
HEMATOCRIT: 38.6 % (ref 36.0–46.0)
Hemoglobin: 12.9 g/dL (ref 12.0–15.0)
Lymphocytes Relative: 49 % — ABNORMAL HIGH (ref 12–46)
Lymphs Abs: 6.1 10*3/uL — ABNORMAL HIGH (ref 0.7–4.0)
MCH: 27.5 pg (ref 26.0–34.0)
MCHC: 33.4 g/dL (ref 30.0–36.0)
MCV: 82.3 fL (ref 78.0–100.0)
MONOS PCT: 4 % (ref 3–12)
Monocytes Absolute: 0.5 10*3/uL (ref 0.1–1.0)
Neutro Abs: 5.8 10*3/uL (ref 1.7–7.7)
Neutrophils Relative %: 46 % (ref 43–77)
Platelets: 213 10*3/uL (ref 150–400)
RBC: 4.69 MIL/uL (ref 3.87–5.11)
RDW: 13.6 % (ref 11.5–15.5)
WBC: 12.5 10*3/uL — AB (ref 4.0–10.5)

## 2015-06-02 LAB — COMPREHENSIVE METABOLIC PANEL
ALK PHOS: 84 U/L (ref 38–126)
ALT: 25 U/L (ref 14–54)
AST: 25 U/L (ref 15–41)
Albumin: 3.7 g/dL (ref 3.5–5.0)
Anion gap: 9 (ref 5–15)
BUN: 13 mg/dL (ref 6–20)
CO2: 28 mmol/L (ref 22–32)
Calcium: 9.7 mg/dL (ref 8.9–10.3)
Chloride: 106 mmol/L (ref 101–111)
Creatinine, Ser: 0.67 mg/dL (ref 0.44–1.00)
GLUCOSE: 101 mg/dL — AB (ref 65–99)
POTASSIUM: 3.9 mmol/L (ref 3.5–5.1)
Sodium: 143 mmol/L (ref 135–145)
Total Bilirubin: 0.8 mg/dL (ref 0.3–1.2)
Total Protein: 7.4 g/dL (ref 6.5–8.1)

## 2015-06-02 LAB — I-STAT TROPONIN, ED: TROPONIN I, POC: 0 ng/mL (ref 0.00–0.08)

## 2015-06-02 MED ORDER — SODIUM CHLORIDE 0.9 % IV BOLUS (SEPSIS)
1000.0000 mL | Freq: Once | INTRAVENOUS | Status: AC
  Administered 2015-06-02: 1000 mL via INTRAVENOUS

## 2015-06-02 MED ORDER — DIPHENHYDRAMINE HCL 50 MG/ML IJ SOLN
12.5000 mg | Freq: Once | INTRAMUSCULAR | Status: AC
  Administered 2015-06-02: 12.5 mg via INTRAVENOUS
  Filled 2015-06-02: qty 1

## 2015-06-02 MED ORDER — MECLIZINE HCL 25 MG PO TABS: 25.0000 mg | ORAL_TABLET | Freq: Three times a day (TID) | ORAL | Status: DC | PRN

## 2015-06-02 MED ORDER — MECLIZINE HCL 25 MG PO TABS
25.0000 mg | ORAL_TABLET | Freq: Once | ORAL | Status: AC
  Administered 2015-06-02: 25 mg via ORAL
  Filled 2015-06-02: qty 1

## 2015-06-02 MED ORDER — METOCLOPRAMIDE HCL 5 MG/ML IJ SOLN
10.0000 mg | Freq: Once | INTRAMUSCULAR | Status: AC
  Administered 2015-06-02: 10 mg via INTRAVENOUS
  Filled 2015-06-02: qty 2

## 2015-06-02 NOTE — ED Provider Notes (Signed)
CSN: [id number]     Arrival date & time 06/02/15  1608 History   First MD Initiated Contact with Patient 06/02/15 1901     Chief Complaint  Patient presents with  . Dizziness  . Headache     (Consider location/radiation/quality/duration/timing/severity/associated sxs/prior Treatment) The history is provided by the patient.  Kerri Osborn is a 54 y.o. female hx of DM, bipolar, vertigo, MI here presenting with dizziness. Dizziness for the last 3 days. She felt like the room was spinning. She got up today and then had worsening dizziness and vertigo. She states that she had to close her eyes and then walk along the wall to use the bathroom. At [facility] and has trouble with balance today. Some mild headache and nausea associated with it. Denies any chest pain or shortness of breath. Has dizziness previously that required bilateral ear tubes several years ago at [place].    Past Medical History  Diagnosis Date  . Diabetes mellitus   . Bipolar disorder   . Asthma   . Coronary artery disease   . Arthritis   . Dyspnea 04/13/2011    pfts 03/2011:  Normal FEV1 and FEV1/FVC Ct chest 2012:  No PE, normal parenchyma [name] 04/18/2011 with active symptoms:  Normal FEV1%, mild decrease in FVC due to restriction vs airtrapping, normal appearing       flow volume loop.    . Depression   . Chicken pox   . Seasonal allergies   . Migraines   . 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 Allergy   . Myocardial infarction   . Diverticulosis   . Fatty liver   . Polyarthralgia - managed by Dr. Ouida Sills per her report 02/05/2013   Past Surgical History  Procedure Laterality Date  . Abdominal hysterectomy    . Tubal ligation    . Appendectomy    . Myomectomy    . Cesarean section    . Breast cyst excision    . Inner ear surgery      tubes in ears   Family History  Problem Relation Age of Onset  . Allergies Sister   . Allergies Daughter   . Allergies Daughter   . Asthma Daughter   .  Asthma Daughter   . Arthritis Maternal Grandmother   . Hyperlipidemia Maternal Grandmother   . Diabetes Maternal Grandmother   . Hypertension Maternal Aunt   . Sudden death Mother   . Kidney failure Mother   . Sudden death Father   . Mental illness    . Colon cancer Neg Hx   . Rectal cancer Neg Hx   . Stomach cancer Neg Hx    History  Substance Use Topics  . Smoking status: Never Smoker   . Smokeless tobacco: Never Used  . Alcohol Use: No   OB History    No data available     Review of Systems  Neurological: Positive for dizziness and headaches.  All other systems reviewed and are negative.     Allergies  Amoxicillin  Home Medications   Prior to Admission medications   Medication Sig Start Date End Date Taking? Authorizing Provider  busPIRone (BUSPAR) 15 MG tablet Take 1/2 tablet by mouth 2 times daily. Patient taking differently: Take 15 mg by mouth 3 (three) times daily.  11/17/14  Yes Philemon Kingdom, MD  insulin aspart (NOVOLOG FLEXPEN) 100 UNIT/ML FlexPen Inject 6-8 Units into the skin 3 (three) times daily with meals.  Patient taking differently: Inject 10 Units into the skin 3 (three) times daily with meals.  11/17/14  Yes Philemon Kingdom, MD  Insulin Glargine (LANTUS SOLOSTAR) 100 UNIT/ML Solostar Pen Inject 26 Units into the skin daily at 10 pm. Patient taking differently: Inject 10 Units into the skin daily at 10 pm.  11/17/14  Yes Philemon Kingdom, MD  JANUMET 50-1000 MG per tablet take 1 tablet by mouth twice a day with meals 03/26/15  Yes Philemon Kingdom, MD  lamoTRIgine (LAMICTAL) 100 MG tablet Take 1 tablet (100 mg total) by mouth daily. Patient taking differently: Take 200 mg by mouth daily.  11/17/14  Yes Philemon Kingdom, MD  Naproxen Sodium (ALEVE) 220 MG CAPS Take 220 mg by mouth 2 (two) times daily as needed (pain,headache).   Yes Historical Provider, MD  sertraline (ZOLOFT) 100 MG tablet Take 1 tablet (100 mg total) by mouth daily. 11/17/14  Yes  Philemon Kingdom, MD  albuterol (PROVENTIL HFA;VENTOLIN HFA) 108 (90 BASE) MCG/ACT inhaler Inhale 2 puffs into the lungs every 6 (six) hours as needed for wheezing or shortness of breath. Patient not taking: Reported on 06/02/2015 11/17/14   Philemon Kingdom, MD  LANTUS SOLOSTAR 100 UNIT/ML Solostar Pen INJECT 26 UNITS UNDER THE SKIN DAILY AT 10 P.M. Patient not taking: Reported on 06/02/2015 04/13/15   Philemon Kingdom, MD  NOVOLOG FLEXPEN 100 UNIT/ML FlexPen INJECT 6 TO 8 UNITS UNDER THE SKIN THREE TIMES A DAY WITH MEALS Patient not taking: Reported on 06/02/2015 01/06/15   Philemon Kingdom, MD  sitaGLIPtin-metformin (JANUMET) 50-1000 MG per tablet Take 1 tablet by mouth 2 (two) times daily with a meal. Patient not taking: Reported on 06/02/2015 03/27/15   Lucretia Kern, DO   BP 133/87 mmHg  Pulse 75  Temp(Src) 98.2 F (36.8 C) (Oral)  Resp 11  SpO2 99% Physical Exam  Constitutional: She is oriented to person, place, and time. She appears well-developed and well-nourished.  HENT:  Head: Normocephalic.  Right Ear: External ear normal.  Left Ear: External ear normal.  Mouth/Throat: Oropharynx is clear and moist.  Eyes: Conjunctivae are normal. Pupils are equal, round, and reactive to light.  ? Mild L nystagmus. Not changing directions   Neck: Normal range of motion. Neck supple.  Cardiovascular: Normal rate, regular rhythm and normal heart sounds.   Pulmonary/Chest: Effort normal and breath sounds normal. No respiratory distress. She has no wheezes. She has no rales.  Abdominal: Soft. Bowel sounds are normal. She exhibits no distension. There is no tenderness. There is no rebound.  Musculoskeletal: Normal range of motion. She exhibits no edema.  Neurological: She is alert and oriented to person, place, and time.  CN 2-12 intact. Nl finger to nose. Nl strength throughout   Skin: Skin is warm and dry.  Psychiatric: She has a normal mood and affect. Her behavior is normal. Judgment and thought  content normal.  Nursing note and vitals reviewed.   ED Course  Procedures (including critical care time) Labs Review Labs Reviewed  CBC WITH DIFFERENTIAL/PLATELET - Abnormal; Notable for the following:    WBC 12.5 (*)    Lymphocytes Relative 49 (*)    Lymphs Abs 6.1 (*)    All other components within normal limits  COMPREHENSIVE METABOLIC PANEL - Abnormal; Notable for the following:    Glucose, Bld 101 (*)    All other components within normal limits  I-STAT TROPOININ, ED    Imaging Review Ct Head Wo Contrast  06/02/2015   CLINICAL DATA:  Posterior headache and dizziness for 3 days.  EXAM: CT HEAD WITHOUT CONTRAST  TECHNIQUE: Contiguous axial images were obtained from the base of the skull through the vertex without intravenous contrast.  COMPARISON:  None.  FINDINGS: Intracranial contents appear symmetrical. No mass effect or midline shift. No abnormal extra-axial fluid collections. Gray-white matter junctions are distinct. Basal cisterns are not effaced. No evidence of acute intracranial hemorrhage. No depressed skull fractures. Visualized paranasal sinuses and mastoid air cells are not opacified.  IMPRESSION: No acute intracranial abnormalities.   Electronically Signed   By: Lucienne Capers M.D.   On: 06/02/2015 22:12     EKG Interpretation   Date/Time:  Tuesday June 02 2015 19:27:02 EDT Ventricular Rate:  72 PR Interval:  166 QRS Duration: 81 QT Interval:  413 QTC Calculation: 452 R Axis:   -11 Text Interpretation:  Sinus rhythm Consider left atrial enlargement No  significant change since last tracing Confirmed by YAO  MD, DAVID ([id number])  on 06/02/2015 7:38:37 PM      MDM   Final diagnoses:  None    Scout Guyett is a 54 y.o. female here with vertigo. Likely BPPV. Will get labs, CT head. I doubt dissection or posterior stroke.   10:49 PM Felt better now. Labs unremarkable. CT head unremarkable. Felt better. Nl gait now. Will dc home with meclizine.      Wandra Arthurs, MD 06/02/15 2250

## 2015-06-02 NOTE — Discharge Instructions (Signed)
Take meclizine as needed for dizziness or vertigo.   See your doctor.   Return to ER if you have worse vertigo, dizziness, passing out.

## 2015-06-02 NOTE — ED Notes (Signed)
Onset 3 days vertigo, nausea and headache.  No blurred vision or weakness.

## 2015-06-03 LAB — PATHOLOGIST SMEAR REVIEW

## 2015-06-25 ENCOUNTER — Encounter: Payer: Self-pay | Admitting: Family Medicine

## 2015-06-25 ENCOUNTER — Ambulatory Visit (INDEPENDENT_AMBULATORY_CARE_PROVIDER_SITE_OTHER): Admitting: Family Medicine

## 2015-06-25 VITALS — BP 110/78 | HR 96 | Temp 98.2°F | Ht 65.0 in | Wt 201.5 lb

## 2015-06-25 DIAGNOSIS — E1140 Type 2 diabetes mellitus with diabetic neuropathy, unspecified: Secondary | ICD-10-CM | POA: Diagnosis not present

## 2015-06-25 DIAGNOSIS — L84 Corns and callosities: Secondary | ICD-10-CM

## 2015-06-25 DIAGNOSIS — R319 Hematuria, unspecified: Secondary | ICD-10-CM | POA: Diagnosis not present

## 2015-06-25 DIAGNOSIS — M2550 Pain in unspecified joint: Secondary | ICD-10-CM | POA: Diagnosis not present

## 2015-06-25 DIAGNOSIS — F3177 Bipolar disorder, in partial remission, most recent episode mixed: Secondary | ICD-10-CM

## 2015-06-25 DIAGNOSIS — M5441 Lumbago with sciatica, right side: Secondary | ICD-10-CM | POA: Diagnosis not present

## 2015-06-25 DIAGNOSIS — IMO0002 Reserved for concepts with insufficient information to code with codable children: Secondary | ICD-10-CM

## 2015-06-25 DIAGNOSIS — E1165 Type 2 diabetes mellitus with hyperglycemia: Secondary | ICD-10-CM

## 2015-06-25 LAB — BASIC METABOLIC PANEL
BUN: 19 mg/dL (ref 6–23)
CO2: 30 meq/L (ref 19–32)
Calcium: 9.8 mg/dL (ref 8.4–10.5)
Chloride: 104 mEq/L (ref 96–112)
Creatinine, Ser: 0.75 mg/dL (ref 0.40–1.20)
GFR: 104.48 mL/min (ref >60.00)
GLUCOSE: 80 mg/dL (ref 70–99)
POTASSIUM: 4.2 meq/L (ref 3.5–5.1)
SODIUM: 141 meq/L (ref 135–145)

## 2015-06-25 LAB — POCT URINALYSIS DIPSTICK
GLUCOSE UA: NEGATIVE
Leukocytes, UA: NEGATIVE
Nitrite, UA: NEGATIVE
PH UA: 5.5
Spec Grav, UA: >=1.03
UROBILINOGEN UA: 1

## 2015-06-25 LAB — URINALYSIS, MICROSCOPIC ONLY

## 2015-06-25 LAB — MICROALBUMIN / CREATININE URINE RATIO
Creatinine,U: 276 mg/dL
Microalb Creat Ratio: 23.1 mg/g (ref 0.0–30.0)
Microalb, Ur: 63.7 mg/dL — ABNORMAL HIGH (ref 0.0–1.9)

## 2015-06-25 LAB — HEMOGLOBIN A1C: Hgb A1c MFr Bld: 8 % — ABNORMAL HIGH (ref 4.6–6.5)

## 2015-06-25 MED ORDER — PREDNISONE 20 MG PO TABS: 40.0000 mg | ORAL_TABLET | Freq: Every day | ORAL | Status: DC

## 2015-06-25 MED ORDER — TRAMADOL HCL 50 MG PO TABS: 50.0000 mg | ORAL_TABLET | Freq: Three times a day (TID) | ORAL | Status: DC | PRN

## 2015-06-25 NOTE — Progress Notes (Signed)
Pre visit review using our clinic review tool, if applicable. No additional management support is needed unless otherwise documented below in the visit note. 

## 2015-06-25 NOTE — Progress Notes (Signed)
HPI:  Kerri Osborn is a pleasant 54 yo F with PMH sig for DM, bipolar disorder, peripheral neuropathy, CAD, obesity, migraines and polyarthralgias here for an acute visit for:  Back Pain: -started: 4 days ago -symptoms: sharp pain in R low back, some tingling in buttock and R post leg -worse with: certain movements -better with: aleve, pain medication she had at home -denies: weakness, bowel or bladder incontin, fevers, malaise -reports BS is great -hx of same last year with eval with rheum and spine specialist with mild DDD  HM: -foot exam, pap, optho, hemoglobina1c, microalb/cr  Wants referral to podiatry for calluses on feet.  ROS: See pertinent positives and negatives per HPI.  Past Medical History  Diagnosis Date  . Diabetes mellitus   . Bipolar disorder   . Asthma   . Coronary artery disease   . Arthritis   . Dyspnea 04/13/2011    pfts 03/2011:  Normal FEV1 and FEV1/FVC Ct chest 2012:  No PE, normal parenchyma [name] 04/18/2011 with active symptoms:  Normal FEV1%, mild decrease in FVC due to restriction vs airtrapping, normal appearing       flow volume loop.    . Depression   . Chicken pox   . Seasonal allergies   . Migraines   . 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 Allergy   . Myocardial infarction   . Diverticulosis   . Fatty liver   . Polyarthralgia - managed by Dr. Ouida Sills per her report 02/05/2013    Past Surgical History  Procedure Laterality Date  . Abdominal hysterectomy    . Tubal ligation    . Appendectomy    . Myomectomy    . Cesarean section    . Breast cyst excision    . Inner ear surgery      tubes in ears    Family History  Problem Relation Age of Onset  . Allergies Sister   . Allergies Daughter   . Allergies Daughter   . Asthma Daughter   . Asthma Daughter   . Arthritis Maternal Grandmother   . Hyperlipidemia Maternal Grandmother   . Diabetes Maternal Grandmother   . Hypertension Maternal Aunt   . Sudden death Mother    . Kidney failure Mother   . Sudden death Father   . Mental illness    . Colon cancer Neg Hx   . Rectal cancer Neg Hx   . Stomach cancer Neg Hx     History   Social History  . Marital Status: Married    Spouse Name: N/A  . Number of Children: 2  . Years of Education: N/A   Occupational History  . massage therapist and Esthetician    Social History Main Topics  . Smoking status: Never Smoker   . Smokeless tobacco: Never Used  . Alcohol Use: No  . Drug Use: No  . Sexual Activity: Not on file   Other Topics Concern  . 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      Spiritual Beliefs:       Lifestyle: MWF does kickboxing, vegetarian - trying to go vegan              Current outpatient prescriptions:  .  albuterol (PROVENTIL HFA;VENTOLIN HFA) 108 (90 BASE) MCG/ACT inhaler, Inhale 2  puffs into the lungs every 6 (six) hours as needed for wheezing or shortness of breath., Disp: 18 g, Rfl: 5 .  busPIRone (BUSPAR) 15 MG tablet, Take 1/2 tablet by mouth 2 times daily. (Patient taking differently: Take 15 mg by mouth 3 (three) times daily. ), Disp: 90 tablet, Rfl: 1 .  insulin aspart (NOVOLOG FLEXPEN) 100 UNIT/ML FlexPen, Inject 6-8 Units into the skin 3 (three) times daily with meals. (Patient taking differently: Inject 10 Units into the skin 3 (three) times daily with meals. ), Disp: 15 mL, Rfl: 5 .  Insulin Glargine (LANTUS SOLOSTAR) 100 UNIT/ML Solostar Pen, Inject 26 Units into the skin daily at 10 pm. (Patient taking differently: Inject 10 Units into the skin daily at 10 pm. ), Disp: 5 pen, Rfl: 2 .  JANUMET 50-1000 MG per tablet, take 1 tablet by mouth twice a day with meals, Disp: 60 tablet, Rfl: 3 .  lamoTRIgine (LAMICTAL) 100 MG tablet, Take 1 tablet (100 mg total) by mouth daily. (Patient taking differently: Take 200 mg by mouth daily. ), Disp: 90 tablet, Rfl: 1 .   LANTUS SOLOSTAR 100 UNIT/ML Solostar Pen, INJECT 26 UNITS UNDER THE SKIN DAILY AT 10 P.M., Disp: 30 mL, Rfl: 1 .  meclizine (ANTIVERT) 25 MG tablet, Take 1 tablet (25 mg total) by mouth 3 (three) times daily as needed for dizziness., Disp: 20 tablet, Rfl: 0 .  Naproxen Sodium (ALEVE) 220 MG CAPS, Take 220 mg by mouth 2 (two) times daily as needed (pain,headache)., Disp: , Rfl:  .  NOVOLOG FLEXPEN 100 UNIT/ML FlexPen, INJECT 6 TO 8 UNITS UNDER THE SKIN THREE TIMES A DAY WITH MEALS, Disp: 5 pen, Rfl: 0 .  sertraline (ZOLOFT) 100 MG tablet, Take 1 tablet (100 mg total) by mouth daily., Disp: 90 tablet, Rfl: 1 .  sitaGLIPtin-metformin (JANUMET) 50-1000 MG per tablet, Take 1 tablet by mouth 2 (two) times daily with a meal., Disp: 180 tablet, Rfl: 1 .  predniSONE (DELTASONE) 20 MG tablet, Take 2 tablets (40 mg total) by mouth daily with breakfast., Disp: 8 tablet, Rfl: 0 .  traMADol (ULTRAM) 50 MG tablet, Take 1 tablet (50 mg total) by mouth every 8 (eight) hours as needed., Disp: 15 tablet, Rfl: 0  EXAM:  Filed Vitals:   06/25/15 1342  BP: 110/78  Pulse: 96  Temp: 98.2 F (36.8 C)    Body mass index is 33.53 kg/(m^2).  GENERAL: vitals reviewed and listed above, alert, oriented, appears well hydrated and in no acute distress  HEENT: atraumatic, conjunttiva clear, no obvious abnormalities on inspection of external nose and ears  NECK: no obvious masses on inspection  LUNGS: clear to auscultation bilaterally, no wheezes, rales or rhonchi, good air movement  CV: HRRR, no peripheral edema  MS: moves all extremities without noticeable abnormality, Normal Gait Normal inspection of back, no obvious scoliosis or leg length descrepancy No bony TTP Soft tissue TTP at: R lower paraspinal muscles -/+ tests: neg trendelenburg,-facet loading, -SLRT, -CLRT, -FABER, -FADIR Normal muscle strength, sensation to light touch and DTRs in LEs bilaterally  PSYCH: pleasant and cooperative, no obvious  depression or anxiety  ASSESSMENT AND PLAN:  Discussed the following assessment and plan:  DM type 2, uncontrolled, with neuropathy - Plan: Hemoglobin B1Y, Basic metabolic panel, Microalbumin/Creatinine Ratio, Urine  Bipolar disorder, in partial remission, most recent episode mixed  Polyarthralgia - managed by Dr. Ouida Sills per her report  Right-sided low back pain with sciatica, sciatica laterality unspecified - Plan: predniSONE ([place])  20 MG tablet, traMADol (ULTRAM) 50 MG tablet -benign exam -suspect mild radiculopathy, discussed options, she wants to tx with short course of prednisone, tramadol - understand risks, follow up in 1 month or sooner if needed -Patient advised to return or notify a doctor immediately if symptoms worsen or persist or new concerns arise.  Patient Instructions  BEFORE YOU LEAVE: -labs today -follow up in 1 month - come fasting  Do the exercises at least 4 days per week for the back  Heat for 15 minutes twice daily  Tylenol for pain per instruction, only use the tramadol for severe pain  Prednisone per instructions  Schedule follow up with yourdiabetes doctor  -We placed a referral for you as discussed to the podiatrist. It usually takes about 1-2 weeks to process and schedule this referral. If you have not heard from [place] regarding this appointment in 2 weeks please contact our office.      Colin Benton R.

## 2015-06-25 NOTE — Patient Instructions (Signed)
BEFORE YOU LEAVE: -labs today -follow up in 1 month - come fasting  Do the exercises at least 4 days per week for the back  Heat for 15 minutes twice daily  Tylenol for pain per instruction, only use the tramadol for severe pain  Prednisone per instructions  Schedule follow up with yourdiabetes doctor  -We placed a referral for you as discussed to the podiatrist. It usually takes about 1-2 weeks to process and schedule this referral. If you have not heard from [place] regarding this appointment in 2 weeks please contact our office.

## 2015-06-25 NOTE — Addendum Note (Signed)
Addended by: Townsend Roger D on: 06/25/2015 02:38 PM   Modules accepted: Orders

## 2015-06-26 ENCOUNTER — Telehealth: Payer: Self-pay | Admitting: Family Medicine

## 2015-06-26 MED ORDER — NITROFURANTOIN MONOHYD MACRO 100 MG PO CAPS: 100.0000 mg | ORAL_CAPSULE | Freq: Two times a day (BID) | ORAL | Status: DC

## 2015-06-26 NOTE — Telephone Encounter (Signed)
Pt was seen yesterday and still waiting on abx call into [facility] [address]. Pt had blood in urine

## 2015-06-26 NOTE — Telephone Encounter (Signed)
Rx done and the pt was informed. 

## 2015-06-26 NOTE — Addendum Note (Signed)
Addended by: Agnes Lawrence on: 06/26/2015 03:39 PM   Modules accepted: Orders

## 2015-06-27 LAB — URINE CULTURE
Colony Count: NO GROWTH
Organism ID, Bacteria: NO GROWTH

## 2015-06-30 ENCOUNTER — Other Ambulatory Visit: Payer: Self-pay | Admitting: *Deleted

## 2015-06-30 DIAGNOSIS — R319 Hematuria, unspecified: Secondary | ICD-10-CM

## 2015-07-02 ENCOUNTER — Emergency Department (HOSPITAL_COMMUNITY)
Admission: EM | Admit: 2015-07-02 | Discharge: 2015-07-02 | Disposition: A | Discharge status: Home / Self Care | Attending: Emergency Medicine | Admitting: Emergency Medicine

## 2015-07-02 ENCOUNTER — Encounter (HOSPITAL_COMMUNITY): Payer: Self-pay | Admitting: Family Medicine

## 2015-07-02 ENCOUNTER — Emergency Department (HOSPITAL_COMMUNITY)

## 2015-07-02 DIAGNOSIS — Z8611 Personal history of tuberculosis: Secondary | ICD-10-CM | POA: Diagnosis not present

## 2015-07-02 DIAGNOSIS — E119 Type 2 diabetes mellitus without complications: Secondary | ICD-10-CM | POA: Diagnosis not present

## 2015-07-02 DIAGNOSIS — Y939 Activity, unspecified: Secondary | ICD-10-CM | POA: Insufficient documentation

## 2015-07-02 DIAGNOSIS — F319 Bipolar disorder, unspecified: Secondary | ICD-10-CM | POA: Diagnosis not present

## 2015-07-02 DIAGNOSIS — S8001XA Contusion of right knee, initial encounter: Secondary | ICD-10-CM | POA: Diagnosis not present

## 2015-07-02 DIAGNOSIS — Z8719 Personal history of other diseases of the digestive system: Secondary | ICD-10-CM | POA: Diagnosis not present

## 2015-07-02 DIAGNOSIS — Y999 Unspecified external cause status: Secondary | ICD-10-CM | POA: Diagnosis not present

## 2015-07-02 DIAGNOSIS — W228XXA Striking against or struck by other objects, initial encounter: Secondary | ICD-10-CM | POA: Insufficient documentation

## 2015-07-02 DIAGNOSIS — I2510 Atherosclerotic heart disease of native coronary artery without angina pectoris: Secondary | ICD-10-CM | POA: Insufficient documentation

## 2015-07-02 DIAGNOSIS — I252 Old myocardial infarction: Secondary | ICD-10-CM | POA: Insufficient documentation

## 2015-07-02 DIAGNOSIS — M1990 Unspecified osteoarthritis, unspecified site: Secondary | ICD-10-CM | POA: Insufficient documentation

## 2015-07-02 DIAGNOSIS — Z8619 Personal history of other infectious and parasitic diseases: Secondary | ICD-10-CM | POA: Diagnosis not present

## 2015-07-02 DIAGNOSIS — S8991XA Unspecified injury of right lower leg, initial encounter: Secondary | ICD-10-CM | POA: Diagnosis present

## 2015-07-02 DIAGNOSIS — Z794 Long term (current) use of insulin: Secondary | ICD-10-CM | POA: Diagnosis not present

## 2015-07-02 DIAGNOSIS — J45909 Unspecified asthma, uncomplicated: Secondary | ICD-10-CM | POA: Insufficient documentation

## 2015-07-02 DIAGNOSIS — G43909 Migraine, unspecified, not intractable, without status migrainosus: Secondary | ICD-10-CM | POA: Diagnosis not present

## 2015-07-02 DIAGNOSIS — Y929 Unspecified place or not applicable: Secondary | ICD-10-CM | POA: Diagnosis not present

## 2015-07-02 DIAGNOSIS — Z79899 Other long term (current) drug therapy: Secondary | ICD-10-CM | POA: Diagnosis not present

## 2015-07-02 MED ORDER — HYDROCODONE-ACETAMINOPHEN 5-325 MG PO TABS: 1.0000 | ORAL_TABLET | Freq: Four times a day (QID) | ORAL | Status: DC | PRN

## 2015-07-02 MED ORDER — HYDROCODONE-ACETAMINOPHEN 5-325 MG PO TABS
1.0000 | ORAL_TABLET | Freq: Once | ORAL | Status: AC
  Administered 2015-07-02: 1 via ORAL
  Filled 2015-07-02: qty 1
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
## 2015-07-02 NOTE — Discharge Instructions (Signed)
Contusion °A contusion is a deep bruise. Contusions are the result of an injury that caused bleeding under the skin. The contusion may turn blue, purple, or yellow. Minor injuries will give you a painless contusion, but more severe contusions may stay painful and swollen for a few weeks.  °CAUSES  °A contusion is usually caused by a blow, trauma, or direct force to an area of the body. °SYMPTOMS  °· Swelling and redness of the injured area. °· Bruising of the injured area. °· Tenderness and soreness of the injured area. °· Pain. °DIAGNOSIS  °The diagnosis can be made by taking a history and physical exam. An X-ray, CT scan, or MRI may be needed to determine if there were any associated injuries, such as fractures. °TREATMENT  °Specific treatment will depend on what area of the body was injured. In general, the best treatment for a contusion is resting, icing, elevating, and applying cold compresses to the injured area. Over-the-counter medicines may also be recommended for pain control. Ask your caregiver what the best treatment is for your contusion. °HOME CARE INSTRUCTIONS  °· Put ice on the injured area. °¨ Put ice in a plastic bag. °¨ Place a towel between your skin and the bag. °¨ Leave the ice on for 15-20 minutes, 3-4 times a day, or as directed by your health care provider. °· Only take over-the-counter or prescription medicines for pain, discomfort, or fever as directed by your caregiver. Your caregiver may recommend avoiding anti-inflammatory medicines (aspirin, ibuprofen, and naproxen) for 48 hours because these medicines may increase bruising. °· Rest the injured area. °· If possible, elevate the injured area to reduce swelling. °SEEK IMMEDIATE MEDICAL CARE IF:  °· You have increased bruising or swelling. °· You have pain that is getting worse. °· Your swelling or pain is not relieved with medicines. °MAKE SURE YOU:  °· Understand these instructions. °· Will watch your condition. °· Will get help right  away if you are not doing well or get worse. °Document Released: 09/14/2005 Document Revised: 12/10/2013 Document Reviewed: 10/10/2011 °[employer]® Patient Information ©2015 [employer]. This information is not intended to replace advice given to you by your health care provider. Make sure you discuss any questions you have with your health care provider. ° °

## 2015-07-08 ENCOUNTER — Ambulatory Visit (INDEPENDENT_AMBULATORY_CARE_PROVIDER_SITE_OTHER): Admitting: Podiatry

## 2015-07-08 ENCOUNTER — Encounter: Payer: Self-pay | Admitting: Podiatry

## 2015-07-08 VITALS — BP 120/79 | HR 75 | Resp 16 | Ht 63.0 in | Wt 200.0 lb

## 2015-07-08 DIAGNOSIS — M722 Plantar fascial fibromatosis: Secondary | ICD-10-CM | POA: Diagnosis not present

## 2015-07-08 NOTE — Progress Notes (Signed)
   Subjective:    Patient ID: Kerri Osborn, female    DOB: March 16, 1961, 54 y.o.   MRN: [id number]  HPIThis diabetic patient presents to office with sharp radiating pain through her right arch.  She says her pain has been present the last few weeks and she stands at work.  She says she stands during work.  She has provided no self treatment.  She says she is fatigue and heaviness after a days activity.She presents for evaluation and treatment.    Review of Systems  Musculoskeletal:       Joint pain  Neurological: Positive for numbness.  All other systems reviewed and are negative.      Objective:   Physical Exam GENERAL APPEARANCE: Alert, conversant. Appropriately groomed. No acute distress.  VASCULAR: Pedal pulses palpable at 2/4 DP and PT bilateral.  Capillary refill time is immediate to all digits,  Proximal to distal cooling it warm to warm.  Digital hair growth is present bilateral  NEUROLOGIC: sensation is intact epicritically and protectively to 5.07 monofilament at 5/5 sites bilateral.  Light touch is intact bilateral, vibratory sensation intact bilateral, achilles tendon reflex is intact bilateral.  MUSCULOSKELETAL: acceptable muscle strength, tone and stability bilateral.  Intrinsic muscluature intact bilateral.  Rectus appearance of foot and digits noted bilateral.   DERMATOLOGIC: skin color, texture, and turgor are within normal limits.  No preulcerative lesions or ulcers  are seen, no interdigital maceration noted.  No open lesions present.  Digital nails are asymptomatic. No drainage noted.         Assessment & Plan:  Plantar fascitis right foot.   IE   Recommend spenco insoles.

## 2015-07-14 ENCOUNTER — Encounter (HOSPITAL_COMMUNITY): Payer: Self-pay | Admitting: Emergency Medicine

## 2015-07-14 ENCOUNTER — Emergency Department (HOSPITAL_COMMUNITY)
Admission: EM | Admit: 2015-07-14 | Discharge: 2015-07-14 | Disposition: A | Discharge status: Home / Self Care | Attending: Emergency Medicine | Admitting: Emergency Medicine

## 2015-07-14 DIAGNOSIS — Z794 Long term (current) use of insulin: Secondary | ICD-10-CM | POA: Insufficient documentation

## 2015-07-14 DIAGNOSIS — F319 Bipolar disorder, unspecified: Secondary | ICD-10-CM | POA: Diagnosis not present

## 2015-07-14 DIAGNOSIS — M1990 Unspecified osteoarthritis, unspecified site: Secondary | ICD-10-CM | POA: Diagnosis not present

## 2015-07-14 DIAGNOSIS — I2510 Atherosclerotic heart disease of native coronary artery without angina pectoris: Secondary | ICD-10-CM | POA: Diagnosis not present

## 2015-07-14 DIAGNOSIS — Z79899 Other long term (current) drug therapy: Secondary | ICD-10-CM | POA: Insufficient documentation

## 2015-07-14 DIAGNOSIS — Z880 Allergy status to penicillin: Secondary | ICD-10-CM | POA: Diagnosis not present

## 2015-07-14 DIAGNOSIS — I252 Old myocardial infarction: Secondary | ICD-10-CM | POA: Insufficient documentation

## 2015-07-14 DIAGNOSIS — J45909 Unspecified asthma, uncomplicated: Secondary | ICD-10-CM | POA: Insufficient documentation

## 2015-07-14 DIAGNOSIS — G43909 Migraine, unspecified, not intractable, without status migrainosus: Secondary | ICD-10-CM | POA: Insufficient documentation

## 2015-07-14 DIAGNOSIS — Z8619 Personal history of other infectious and parasitic diseases: Secondary | ICD-10-CM | POA: Diagnosis not present

## 2015-07-14 DIAGNOSIS — E119 Type 2 diabetes mellitus without complications: Secondary | ICD-10-CM | POA: Diagnosis not present

## 2015-07-14 DIAGNOSIS — R200 Anesthesia of skin: Secondary | ICD-10-CM | POA: Diagnosis not present

## 2015-07-14 DIAGNOSIS — Z8719 Personal history of other diseases of the digestive system: Secondary | ICD-10-CM | POA: Insufficient documentation

## 2015-07-14 DIAGNOSIS — M25561 Pain in right knee: Secondary | ICD-10-CM | POA: Diagnosis not present
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
## 2015-07-14 NOTE — Discharge Instructions (Signed)
Please read and follow all provided instructions.  Your diagnoses today include:  1. Right knee pain    Tests performed today include:  Vital signs. See below for your results today.   Medications prescribed:   Aleve - anti-inflammatory pain medication  Do not exceed 500mg  naproxen every 12 hours, take with food  You have been prescribed an anti-inflammatory medication or NSAID. Take with food. Take smallest effective dose for the shortest duration needed for your pain. Stop taking if you experience stomach pain or vomiting.   Take any prescribed medications only as directed.  Home care instructions:   Follow any educational materials contained in this packet  Follow R.I.C.E. Protocol:  R - rest your injury   I  - use ice on injury without applying directly to skin  C - compress injury with bandage or splint  E - elevate the injury as much as possible  Follow-up instructions: Please follow-up with the provided orthopedic physician (bone specialist) if you continue to have significant pain in 1 week. In this case you may have a more severe injury that requires further care.   Return instructions:   Please return if your toes or feet are numb or tingling, appear gray or blue, or you have severe pain (also elevate the leg and loosen splint or wrap if you were given one)  Please return to the Emergency Department if you experience worsening symptoms.   Please return if you have any other emergent concerns.  Additional Information:  Your vital signs today were: BP 123/81 mmHg   Pulse 82   Temp(Src) 98.1 F (36.7 C) (Oral)   Resp 16   SpO2 99% If your blood pressure (BP) was elevated above 135/85 this visit, please have this repeated by your doctor within one month. --------------

## 2015-08-04 ENCOUNTER — Ambulatory Visit (INDEPENDENT_AMBULATORY_CARE_PROVIDER_SITE_OTHER): Admitting: Family Medicine

## 2015-08-04 DIAGNOSIS — R69 Illness, unspecified: Secondary | ICD-10-CM

## 2015-08-04 NOTE — Progress Notes (Signed)
NO SHOW

## 2015-08-08 ENCOUNTER — Encounter: Payer: Self-pay | Admitting: Family Medicine

## 2015-10-29 ENCOUNTER — Other Ambulatory Visit: Payer: Self-pay | Admitting: *Deleted

## 2015-10-29 ENCOUNTER — Telehealth: Payer: Self-pay | Admitting: Family Medicine

## 2015-10-29 MED ORDER — SITAGLIPTIN PHOS-METFORMIN HCL 50-1000 MG PO TABS: 1.0000 | ORAL_TABLET | Freq: Two times a day (BID) | ORAL | Status: DC

## 2015-10-29 MED ORDER — FREESTYLE LANCETS MISC: Status: DC

## 2015-10-29 MED ORDER — GLUCOSE BLOOD VI STRP: ORAL_STRIP | Status: DC

## 2015-10-29 MED ORDER — INSULIN PEN NEEDLE 32G X 4 MM MISC: Status: DC

## 2015-10-29 NOTE — Telephone Encounter (Signed)
Please call pt. Ok to refill x90 days. Please remove any duplicates from med list. Needs to see her diabetes doc as appears per last endo note is due for appt. Thanks.

## 2015-10-29 NOTE — Telephone Encounter (Signed)
Pt request refill of the following: sitaGLIPtin-metformin (JANUMET) 50-1000 MG per tablet  Pt said [facility] script did not mail her order so she will need to pick up at the below pharmacy    Phamacy:  [facility]

## 2015-10-29 NOTE — Telephone Encounter (Signed)
Rx done and I left a detailed message at the pts home number with the message below.

## 2015-11-16 ENCOUNTER — Telehealth: Payer: Self-pay | Admitting: *Deleted

## 2015-11-16 NOTE — Telephone Encounter (Signed)
Spoke to patient. Patient states she only needs refills on Janumet, which Dr. Maudie Mercury has prescribed in the past. Rx was called in on 11/10 for patient but patient was not aware. Patient is now aware and on way to pick up medication.

## 2015-11-16 NOTE — Telephone Encounter (Signed)
Noted patient has been getting diabetic medication from Dr. Cruzita Lederer at [facility]. Attempted to contact patient and left message to call office as patient needs to call their office for refills.  -------------------------------------------------------------------------------------------------------------------------------------------------------------------------------------------------------------- PLEASE NOTE: All timestamps contained within this report are represented as [place] Standard Time. CONFIDENTIALTY NOTICE: This fax transmission is intended only for the addressee. It contains information that is legally privileged, confidential or otherwise protected from use or disclosure. If you are not the intended recipient, you are strictly prohibited from reviewing, disclosing, copying using or disseminating any of this information or taking any action in reliance on or regarding this information. If you have received this fax in error, please notify us immediately by telephone so that we can arrange for its return to [place]. Phone: [phone], Toll-Free: [phone], Fax: [phone] Page: 1 of 1 Call Id: [id number] [facility] - Client [place] Patient Name: Kerri Osborn Gender: Female DOB: 04-Aug-1961 Age: 54 Y 1 M 2 D Return Phone Number: [phone] (Primary) Address: City/State/Zip: [place] Client [facility] - Client Client Site [facility] - Night Physician Colin Benton Contact Type Call Call Type Triage / Clinical Caller Name Avigayil Relationship To Patient Self Return Phone Number [phone] (Primary) Chief Complaint Medication Question (non symptomatic) Initial Comment Caller states she forgot to bring her flex pen on a trip to [place], [place]. Nurse Assessment Nurse: Denyse Amass, RN, Benjamine Mola Date/Time (Eastern Time): 11/11/2015 7:17:03 PM Please select the assessment type  ---Refill Additional Documentation ---States she needs a refill on her Novalog FlexPen. States she is out of town, in [place], [place]. She forgot her medication. Does the patient have enough medication to last until the office opens? ---Unable to obtain loaner dose from Pharmacy Does the client directives allow for assistance with medications after hours? ---No Additional Documentation ---Patient does not have any symptoms. States she will try to go to a [employer] to get her Rx or to an Urgent Care. Guidelines Guideline Title Affirmed Question Affirmed Notes Nurse Date/Time (Eastern Time) Disp. Time Eilene Ghazi Time) Disposition Final User 11/11/2015 7:24:31 PM Clinical Call Yes [place], RN, Benjamine Mola After Care Instructions Given Call Event Type User Date / Time Description

## 2015-11-17 ENCOUNTER — Encounter: Payer: Self-pay | Admitting: Internal Medicine

## 2015-11-17 ENCOUNTER — Other Ambulatory Visit (INDEPENDENT_AMBULATORY_CARE_PROVIDER_SITE_OTHER): Admitting: *Deleted

## 2015-11-17 ENCOUNTER — Ambulatory Visit (INDEPENDENT_AMBULATORY_CARE_PROVIDER_SITE_OTHER): Admitting: Internal Medicine

## 2015-11-17 VITALS — BP 132/80 | HR 81 | Temp 98.6°F | Resp 12 | Wt 203.0 lb

## 2015-11-17 DIAGNOSIS — E1140 Type 2 diabetes mellitus with diabetic neuropathy, unspecified: Secondary | ICD-10-CM | POA: Diagnosis not present

## 2015-11-17 DIAGNOSIS — IMO0002 Reserved for concepts with insufficient information to code with codable children: Secondary | ICD-10-CM

## 2015-11-17 DIAGNOSIS — E1165 Type 2 diabetes mellitus with hyperglycemia: Secondary | ICD-10-CM

## 2015-11-17 LAB — POCT GLYCOSYLATED HEMOGLOBIN (HGB A1C): HEMOGLOBIN A1C: 8.9

## 2015-11-17 LAB — LIPID PANEL
CHOLESTEROL: 234 mg/dL — AB (ref 0–200)
HDL: 44.6 mg/dL (ref >39.00)
LDL Cholesterol: 171 mg/dL — ABNORMAL HIGH (ref 0–99)
NonHDL: 189.84
Total CHOL/HDL Ratio: 5
Triglycerides: 92 mg/dL (ref 0.0–149.0)
VLDL: 18.4 mg/dL (ref 0.0–40.0)

## 2015-11-17 MED ORDER — METFORMIN HCL 1000 MG PO TABS: 1000.0000 mg | ORAL_TABLET | Freq: Two times a day (BID) | ORAL | Status: DC

## 2015-11-17 NOTE — Patient Instructions (Addendum)
Please continue: - Lantus 26 units at bedtime. - NovoLog 6 units - 15 min before every meal - Sliding scale of NovoLog: - 150-175: + 1 unit  - 176-200: + 2 units  - 201-225: + 3 units  - >225: + 4 units   Keep off JanuMet and start Metformin 1000 mg 2x day. Start with 1/2 tablet daily and advance slowly to 1000 mg 2x a day.  Please return in 3 months with your sugar log.  Please stop at the lab.

## 2015-11-17 NOTE — Progress Notes (Signed)
Subjective:     Patient ID: Kerri Osborn, female   DOB: 03-29-1961, 54 y.o.   MRN: [id number]  HPI Kerri Osborn is a 54 y.o. woman, returning for f/u for DM2, dx. [id number], without complications, uncontrolled, insulin-dependent since 2012. Last visit 9 mo ago.  Husband died in 08-02-2015 (hemorrhagic stroke).  She went to the ED last week for sugars 360 >> given iv insulin.  Her last A1c was: Lab Results  Component Value Date   HGBA1C 8.0* 06/25/2015   HGBA1C 7.2* 11/17/2014   HGBA1C 10.2* 08/18/2014   Previous treatment regimen: - VGo 20 + 3 clicks per meal - since last year >> bruising and numbness >> would like switch to injections - JanuMet 50-1000 mg daily bid  She is now on - wanted to come off the VGo as it caused skin changes: - Lantus 24 >> 26 units at bedtime. - NovoLog 6 units 15 min before every meal - 3x a day - Sliding scale of NovoLog - does not need to use it lately: - 150-175: + 1 unit  - 176-200: + 2 units  - 201-225: + 3 units  - >225: + 4 units  She was out of Janumet 50-1000 mg 2x a day - for 2 months.  Does not bring a log >> checks 2x a day: - am: 120-190 >> 140s >> 145-155 >> 80-110 >> 90-97 >> 140-160 - 2h after b'fast: n/c >> highest 165 (pancakes) >. n/c - before lunch: 140-160 >> n/c - 2h after lunch: n/c >> 108-120 >> n/c - before dinner: 170-214 >> 160s >> n/c >> 100-103 >> 200s - bedtime: 140-150s >> n/c No lows. She has hypoglycemia awareness at 60. No lows recently.  She was vegan but was advised by GI to switch to regular diet (diverticulosis on colonoscopy). She is now eating a regular diet.  - Last eye appt: 02/2015. Reportedly, no DR.  - No CKD: Lab Results  Component Value Date   BUN 19 06/25/2015   BUN 13 06/02/2015   Lab Results  Component Value Date   CREATININE 0.75 06/25/2015   CREATININE 0.67 06/02/2015  Not on ACEI/ARB.  Last ACR: Component     Latest Ref Rng 06/25/2015  Microalb, Ur     0.0 - 1.9 mg/dL 63.7 (H)   Creatinine,U      276.0  MICROALB/CREAT RATIO     0.0 - 30.0 mg/g 23.1  Previously: Component     Latest Ref Rng 04/15/2014  Microalb, Ur     0.0 - 1.9 mg/dL 18.1 (H)  Creatinine,U      230.0  MICROALB/CREAT RATIO     0.0 - 30.0 mg/g 7.9   - She has HL: Lab Results  Component Value Date   CHOL 219* 04/15/2014   HDL 40.80 04/15/2014   LDLCALC 142* 04/15/2014   LDLDIRECT 146.2 02/05/2013   TRIG 183.0* 04/15/2014   CHOLHDL 5 04/15/2014  On Pravastatin. - She does have foot pain, likely from back pain.   She also has a history of being bipolar, migraines, and asthma. Also has RA by Dr. Ouida Sills (rheum.).  Review of Systems Constitutional: no weight gain, no fatigue Eyes: no blurry vision, no xerophthalmia ENT: no sore throat, no nodules palpated in throat, no dysphagia/odynophagia, no hoarseness Cardiovascular: no CP/SOB/palpitations/leg swelling Respiratory: no cough/SOB Gastrointestinal: + N/no V/D/C  Musculoskeletal: + muscle/+ joint pain Skin: no rashes Neurological: no tremors/numbness/tingling/dizziness  I reviewed pt's medications, allergies, PMH, social hx, family hx, and  changes were documented in the history of present illness. Otherwise, unchanged from my initial visit note.  Objective:   Physical Exam BP 132/80 mmHg  Pulse 81  Temp(Src) 98.6 F (37 C) (Oral)  Resp 12  Wt 203 lb (92.08 kg)  SpO2 98% Body mass index is 35.97 kg/(m^2). Wt Readings from Last 3 Encounters:  11/17/15 203 lb (92.08 kg)  07/08/15 200 lb (90.719 kg)  07/02/15 200 lb (90.719 kg)  Constitutional: overweight, in NAD Eyes: PERRLA, EOMI, no exophthalmos ENT: moist mucous membranes, no thyromegaly, no cervical lymphadenopathy Cardiovascular: RRR, No MRG Respiratory: CTA B Gastrointestinal: abdomen soft, NT, ND, BS+ Musculoskeletal: no deformities, strength intact in all 4  Assessment:     1. DM2, without complications, uncontrolled, insulin-dependent  2. HL    Plan:      1. DM2 - Patient returning after a long absence. Her husband died this summer and she is grieving. Sugars are higher, especially also as she had to stop JanuMet 2/2 insurance coverage. She did not let me know about this, as I could have called in just Metformin... Will start Metformin now. - Will not change regimen:  Patient Instructions  Please continue: - Lantus 26 units at bedtime. - NovoLog 6 units - 15 min before every meal - Sliding scale of NovoLog: - 150-175: + 1 unit  - 176-200: + 2 units  - 201-225: + 3 units  - >225: + 4 units   Keep off JanuMet and start Metformin 1000 mg 2x day. Start with 1/2 tablet daily and advance slowly to 1000 mg 2x a day.  Please return in 3 months with your sugar log.  Please stop at the lab.  - will check HbA1c >> 8.9% (higher) - will check lipids now - she is fasting - refuses flu vaccine - UTD with eye exams - I will see her back in 3 months with her sugar log  2. HL - On Pravastatin - recheck Lipids today, she mentions she is fasting this am  Orders Only on 11/17/2015  Component Date Value Ref Range Status  . Hemoglobin A1C 11/17/2015 8.9   Final  Office Visit on 11/17/2015  Component Date Value Ref Range Status  . HM Diabetic Eye Exam 03/19/2015 No Retinopathy  No Retinopathy Final  . Cholesterol 11/17/2015 234* 0 - 200 mg/dL Final   ATP III Classification       Desirable:  < 200 mg/dL               Borderline High:  200 - 239 mg/dL          High:  > = 240 mg/dL  . Triglycerides 11/17/2015 92.0  0.0 - 149.0 mg/dL Final   Normal:  <150 mg/dLBorderline High:  150 - 199 mg/dL  . HDL 11/17/2015 44.60  >39.00 mg/dL Final  . VLDL 11/17/2015 18.4  0.0 - 40.0 mg/dL Final  . LDL Cholesterol 11/17/2015 171* 0 - 99 mg/dL Final  . Total CHOL/HDL Ratio 11/17/2015 5   Final                  Men          Women1/2 Average Risk     3.4          3.3Average Risk          5.0          4.42X Average Risk          9.6  7.13X Average Risk           15.0          11.0                      . NonHDL 11/17/2015 189.84   Final   NOTE:  Non-HDL goal should be 30 mg/dL higher than patient's LDL goal (i.e. LDL goal of < 70 mg/dL, would have non-HDL goal of < 100 mg/dL)   LDL worse. We'll advise increase compliance with statin and diet.

## 2015-12-24 ENCOUNTER — Encounter (HOSPITAL_COMMUNITY): Payer: Self-pay | Admitting: Emergency Medicine

## 2015-12-24 ENCOUNTER — Emergency Department (HOSPITAL_COMMUNITY)
Admission: EM | Admit: 2015-12-24 | Discharge: 2015-12-24 | Disposition: A | Discharge status: Home / Self Care | Attending: Emergency Medicine | Admitting: Emergency Medicine

## 2015-12-24 DIAGNOSIS — I252 Old myocardial infarction: Secondary | ICD-10-CM | POA: Insufficient documentation

## 2015-12-24 DIAGNOSIS — Y990 Civilian activity done for income or pay: Secondary | ICD-10-CM | POA: Insufficient documentation

## 2015-12-24 DIAGNOSIS — Z794 Long term (current) use of insulin: Secondary | ICD-10-CM | POA: Insufficient documentation

## 2015-12-24 DIAGNOSIS — I2510 Atherosclerotic heart disease of native coronary artery without angina pectoris: Secondary | ICD-10-CM | POA: Insufficient documentation

## 2015-12-24 DIAGNOSIS — M5417 Radiculopathy, lumbosacral region: Secondary | ICD-10-CM

## 2015-12-24 DIAGNOSIS — J45909 Unspecified asthma, uncomplicated: Secondary | ICD-10-CM | POA: Insufficient documentation

## 2015-12-24 DIAGNOSIS — Y9289 Other specified places as the place of occurrence of the external cause: Secondary | ICD-10-CM | POA: Insufficient documentation

## 2015-12-24 DIAGNOSIS — M069 Rheumatoid arthritis, unspecified: Secondary | ICD-10-CM | POA: Insufficient documentation

## 2015-12-24 DIAGNOSIS — Z8619 Personal history of other infectious and parasitic diseases: Secondary | ICD-10-CM | POA: Diagnosis not present

## 2015-12-24 DIAGNOSIS — S8992XA Unspecified injury of left lower leg, initial encounter: Secondary | ICD-10-CM | POA: Diagnosis not present

## 2015-12-24 DIAGNOSIS — Y9389 Activity, other specified: Secondary | ICD-10-CM | POA: Diagnosis not present

## 2015-12-24 DIAGNOSIS — X501XXA Overexertion from prolonged static or awkward postures, initial encounter: Secondary | ICD-10-CM | POA: Insufficient documentation

## 2015-12-24 DIAGNOSIS — S3992XA Unspecified injury of lower back, initial encounter: Secondary | ICD-10-CM | POA: Diagnosis present

## 2015-12-24 DIAGNOSIS — F319 Bipolar disorder, unspecified: Secondary | ICD-10-CM | POA: Diagnosis not present

## 2015-12-24 DIAGNOSIS — Z880 Allergy status to penicillin: Secondary | ICD-10-CM | POA: Insufficient documentation

## 2015-12-24 DIAGNOSIS — E119 Type 2 diabetes mellitus without complications: Secondary | ICD-10-CM | POA: Insufficient documentation

## 2015-12-24 DIAGNOSIS — Z7984 Long term (current) use of oral hypoglycemic drugs: Secondary | ICD-10-CM | POA: Diagnosis not present

## 2015-12-24 DIAGNOSIS — G43909 Migraine, unspecified, not intractable, without status migrainosus: Secondary | ICD-10-CM | POA: Insufficient documentation

## 2015-12-24 DIAGNOSIS — S8991XA Unspecified injury of right lower leg, initial encounter: Secondary | ICD-10-CM | POA: Diagnosis not present

## 2015-12-24 DIAGNOSIS — Z79899 Other long term (current) drug therapy: Secondary | ICD-10-CM | POA: Insufficient documentation

## 2015-12-24 DIAGNOSIS — Z8719 Personal history of other diseases of the digestive system: Secondary | ICD-10-CM | POA: Diagnosis not present

## 2015-12-24 HISTORY — DX: Rheumatoid arthritis, unspecified: M06.9

## 2015-12-24 LAB — CBG MONITORING, ED: GLUCOSE-CAPILLARY: 72 mg/dL (ref 65–99)

## 2015-12-24 MED ORDER — HYDROMORPHONE HCL 1 MG/ML IJ SOLN
1.0000 mg | Freq: Once | INTRAMUSCULAR | Status: AC
  Administered 2015-12-24: 1 mg via INTRAMUSCULAR
  Filled 2015-12-24: qty 1

## 2015-12-24 MED ORDER — DEXAMETHASONE SODIUM PHOSPHATE 10 MG/ML IJ SOLN
10.0000 mg | Freq: Once | INTRAMUSCULAR | Status: AC
  Administered 2015-12-24: 10 mg via INTRAMUSCULAR
  Filled 2015-12-24: qty 1

## 2015-12-24 MED ORDER — KETOROLAC TROMETHAMINE 60 MG/2ML IM SOLN
60.0000 mg | Freq: Once | INTRAMUSCULAR | Status: AC
  Administered 2015-12-24: 60 mg via INTRAMUSCULAR
  Filled 2015-12-24: qty 2

## 2015-12-24 MED ORDER — PREDNISONE 20 MG PO TABS
40.0000 mg | ORAL_TABLET | Freq: Every day | ORAL | Status: DC
Start: 2015-12-24 — End: 2016-03-04

## 2015-12-24 MED ORDER — METHOCARBAMOL 500 MG PO TABS: 500.0000 mg | ORAL_TABLET | Freq: Two times a day (BID) | ORAL | Status: DC

## 2015-12-24 MED ORDER — HYDROCODONE-ACETAMINOPHEN 5-325 MG PO TABS: 1.0000 | ORAL_TABLET | Freq: Four times a day (QID) | ORAL | Status: DC | PRN

## 2015-12-24 NOTE — Discharge Instructions (Signed)
Ibuprofen or tylenol for pain. Norco for severe pain only. Robaxin for spasms. Take prednisone until all gone, next dose tomorrow. Follow up with primary care doctor. Return if any weakness in legs, difficulty controlling bowels or urine, any new concerning symptoms.    Lumbosacral Radiculopathy Lumbosacral radiculopathy is a condition that involves the spinal nerves and nerve roots in the low back and bottom of the spine. The condition develops when these nerves and nerve roots move out of place or become inflamed and cause symptoms. CAUSES This condition may be caused by:  Pressure from a disk that bulges out of place (herniated disk). A disk is a plate of cartilage that separates bones in the spine.  Disk degeneration.  A narrowing of the bones of the lower back (spinal stenosis).  A tumor.  An infection.  An injury that places sudden pressure on the disks that cushion the bones of your lower spine. RISK FACTORS This condition is more likely to develop in:  Males aged 30-50 years.  Females aged 39-60 years.  People who lift improperly.  People who are overweight or live a sedentary lifestyle.  People who smoke.  People who perform repetitive activities that strain the spine. SYMPTOMS Symptoms of this condition include:  Pain that goes down from the back into the legs (sciatica). This is the most common symptom. The pain may be worse with sitting, coughing, or sneezing.  Pain and numbness in the arms and legs.  Muscle weakness.  Tingling.  Loss of bladder control or bowel control. DIAGNOSIS This condition is diagnosed with a physical exam and medical history. If the pain is lasting, you may have tests, such as:  MRI scan.  X-ray.  CT scan.  Myelogram.  Nerve conduction study. TREATMENT This condition is often treated with:  Hot packs and ice applied to affected areas.  Stretches to improve flexibility.  Exercises to strengthen back  muscles.  Physical therapy.  Pain medicine.  A steroid injection in the spine. In some cases, no treatment is needed. If the condition is long-lasting (chronic), or if symptoms are severe, treatment may involve surgery or lifestyle changes, such as following a weight loss plan. HOME CARE INSTRUCTIONS Medicines  Take medicines only as directed by your health care provider.  Do not drive or operate heavy machinery while taking pain medicine. Injury Care  Apply a heat pack to the injured area as directed by your health care provider.  Apply ice to the affected area:  Put ice in a plastic bag.  Place a towel between your skin and the bag.  Leave the ice on for 20-30 minutes, every 2 hours while you are awake or as needed. Or, leave the ice on for as long as directed by your health care provider. Other Instructions  If you were shown how to do any exercises or stretches, do them as directed by your health care provider.  If your health care provider prescribed a diet or exercise program, follow it as directed.  Keep all follow-up visits as directed by your health care provider. This is important. SEEK MEDICAL CARE IF:  Your pain does not improve over time even when taking pain medicines. SEEK IMMEDIATE MEDICAL CARE IF:  Your develop severe pain.  Your pain suddenly gets worse.  You develop increasing weakness in your legs.  You lose the ability to control your bladder or bowel.  You have difficulty walking or balancing.  You have a fever.   This information is not  intended to replace advice given to you by your health care provider. Make sure you discuss any questions you have with your health care provider.   Document Released: 12/05/2005 Document Revised: 04/21/2015 Document Reviewed: 12/01/2014 Elsevier Interactive Patient Education [employer].

## 2015-12-24 NOTE — ED Notes (Signed)
Pt states was at work, twisted to throw something away, felt a "snap" at her sacrum, and felt pain shooting down both of her legs with a tingling sensation in toes. Pulse, movement, sensation intact in lower extremities bilaterally. Pain with sitting and movement.

## 2015-12-24 NOTE — ED Notes (Signed)
PA at bedside.

## 2015-12-24 NOTE — ED Provider Notes (Signed)
CSN: [id number]     Arrival date & time 12/24/15  2005 History  By signing my name below, I, Jolayne Panther, attest that this documentation has been prepared under the direction and in the presence of Brennen Gardiner, PA-C. Electronically Signed: Jolayne Panther, Scribe. 12/24/2015. 9:18 PM.  Chief Complaint  Patient presents with  . Back Pain    The history is provided by the patient. No language interpreter was used.    HPI Comments: Kerri Osborn is a 55 y.o. female with a hx of DM who presents to the Emergency Department complaining of moderate, burning, middle back pain with radiation into the left side of her back and down her legs to her feet after injuring her back earlier today while she twisted to throw something away at work. She reports that she heard a pop and also notes that she feels a tingling sensation in her feet. Pt reports that she has never experienced similar back pain before and notes that she has not urinated since the incident. Pt denies numbness and weakness in her feet and loss of bowel and bladder control.   Past Medical History  Diagnosis Date  . Diabetes mellitus   . Bipolar disorder ([place])   . Asthma   . Coronary artery disease   . Arthritis   . Dyspnea 04/13/2011    pfts 03/2011:  Normal FEV1 and FEV1/FVC Ct chest 2012:  No PE, normal parenchyma [name] 04/18/2011 with active symptoms:  Normal FEV1%, mild decrease in FVC due to restriction vs airtrapping, normal appearing       flow volume loop.    . Depression   . Chicken pox   . Seasonal allergies   . Migraines   . 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 Allergy   . Myocardial infarction ([place])   . Diverticulosis   . Fatty liver   . Polyarthralgia - managed by Dr. Ouida Sills per her report 02/05/2013  . Arthritis, rheumatoid [facility])    Past Surgical History  Procedure Laterality Date  . Abdominal hysterectomy    . Tubal ligation    . Appendectomy    . Myomectomy    . Cesarean  section    . Breast cyst excision    . Inner ear surgery      tubes in ears   Family History  Problem Relation Age of Onset  . Allergies Sister   . Allergies Daughter   . Allergies Daughter   . Asthma Daughter   . Asthma Daughter   . Arthritis Maternal Grandmother   . Hyperlipidemia Maternal Grandmother   . Diabetes Maternal Grandmother   . Hypertension Maternal Aunt   . Sudden death Mother   . Kidney failure Mother   . Sudden death Father   . Mental illness    . Colon cancer Neg Hx   . Rectal cancer Neg Hx   . Stomach cancer Neg Hx    Social History  Substance Use Topics  . Smoking status: Never Smoker   . Smokeless tobacco: Never Used  . Alcohol Use: No   OB History    No data available     Review of Systems  Musculoskeletal: Positive for back pain.  Neurological: Negative for weakness and numbness.   Allergies  Amoxicillin  Home Medications   Prior to Admission medications   Medication Sig Start Date End Date Taking? Authorizing Provider  albuterol (PROVENTIL HFA;VENTOLIN HFA) 108 (90 BASE) MCG/ACT inhaler  Inhale 2 puffs into the lungs every 6 (six) hours as needed for wheezing or shortness of breath. 11/17/14   Philemon Kingdom, MD  busPIRone (BUSPAR) 15 MG tablet Take 1/2 tablet by mouth 2 times daily. Patient taking differently: Take 15 mg by mouth 3 (three) times daily.  11/17/14   Philemon Kingdom, MD  glucose blood (FREESTYLE LITE) test strip Use to test blood sugar 2 times daily as instructed. 10/29/15   Philemon Kingdom, MD  HYDROcodone-acetaminophen (NORCO/VICODIN) 5-325 MG per tablet Take 1 tablet by mouth every 6 (six) hours as needed. 07/02/15   Quintella Reichert, MD  insulin aspart (NOVOLOG FLEXPEN) 100 UNIT/ML FlexPen Inject 6-8 Units into the skin 3 (three) times daily with meals. Patient taking differently: Inject 10 Units into the skin 3 (three) times daily with meals.  11/17/14   Philemon Kingdom, MD  Insulin Glargine (LANTUS SOLOSTAR) 100 UNIT/ML  Solostar Pen Inject 26 Units into the skin daily at 10 pm. Patient taking differently: Inject 10 Units into the skin daily at 10 pm.  11/17/14   Philemon Kingdom, MD  Insulin Pen Needle (CAREFINE PEN NEEDLES) 32G X 4 MM MISC Use to inject insulin 4 times daily. 10/29/15   Philemon Kingdom, MD  lamoTRIgine (LAMICTAL) 100 MG tablet Take 1 tablet (100 mg total) by mouth daily. Patient taking differently: Take 200 mg by mouth daily.  11/17/14   Philemon Kingdom, MD  Lancets (FREESTYLE) lancets Use to test blood sugar 2 times daily as instructed. 10/29/15   Philemon Kingdom, MD  LANTUS SOLOSTAR 100 UNIT/ML Solostar Pen INJECT 26 UNITS UNDER THE SKIN DAILY AT 10 P.M. 04/13/15   Philemon Kingdom, MD  meclizine (ANTIVERT) 25 MG tablet Take 1 tablet (25 mg total) by mouth 3 (three) times daily as needed for dizziness. 06/02/15   Wandra Arthurs, MD  metFORMIN (GLUCOPHAGE) 1000 MG tablet Take 1 tablet (1,000 mg total) by mouth 2 (two) times daily with a meal. 11/17/15   Philemon Kingdom, MD  sertraline (ZOLOFT) 100 MG tablet Take 1 tablet (100 mg total) by mouth daily. 11/17/14   Philemon Kingdom, MD  traMADol (ULTRAM) 50 MG tablet Take 1 tablet (50 mg total) by mouth every 8 (eight) hours as needed. 06/25/15   Lucretia Kern, DO   BP 130/76 mmHg  Pulse 78  Temp(Src) 98.4 F (36.9 C) (Oral)  Resp 18  SpO2 99% Physical Exam  Constitutional: She is oriented to person, place, and time. She appears well-developed and well-nourished. No distress.  HENT:  Head: Normocephalic and atraumatic.  Eyes: Conjunctivae are normal.  Cardiovascular: Normal rate, regular rhythm and normal heart sounds.   Pulmonary/Chest: Effort normal and breath sounds normal.  Abdominal: She exhibits no distension.  Musculoskeletal:   Midline and bilateral paravertebral lumbar spine tenderness. Pain with bilateral straight leg raise. Pain in bilateral SI joints.  Neurological: She is alert and oriented to person, place, and time.  5/5 and  equal lower extremity strength. 2+ and equal patellar reflexes bilaterally. Pt able to dorsiflex bilateral toes and feet with good strength against resistance. Equal sensation bilaterally over thighs and lower legs.   Skin: Skin is warm and dry.  Psychiatric: She has a normal mood and affect.  Nursing note and vitals reviewed.   ED Course  Procedures  DIAGNOSTIC STUDIES:    Oxygen Saturation is 99% on RA, normal by my interpretation.   COORDINATION OF CARE:  9:10 PM Will administer pt pain medication in the ED. Discussed treatment plan with  pt at bedside and pt agreed to plan.   Results for orders placed or performed in visit on 11/17/15  POCT HgB A1C  Result Value Ref Range   Hemoglobin A1C 8.9    Imaging Review No results found. I have personally reviewed and evaluated these images and lab results as part of my medical decision-making.  MDM   Final diagnoses:  Lumbosacral radiculopathy    patient with acute onset of lower back pain that radiates into bilateral lower extremities. Neurovascularly intact. No evidence of cauda equina. Discussed with patient treatment plan, patient does not want MRI or definitely any surgical treatment at this time. We'll try to manage her pain in ER. And have her follow  primary care doctor.  Medications  HYDROmorphone (DILAUDID) injection 1 mg (1 mg Intramuscular Given 12/24/15 2155)  ketorolac (TORADOL) injection 60 mg (60 mg Intramuscular Given 12/24/15 2155)  dexamethasone (DECADRON) injection 10 mg (10 mg Intramuscular Given 12/24/15 2155)    10:48 PM Pt feels much better. Will dc home with prednisone, norco, robaxin. Follow up with pcp.   Filed Vitals:   12/24/15 2026  BP: 130/76  Pulse: 78  Temp: 98.4 F (36.9 C)  TempSrc: Oral  Resp: 18  SpO2: 99%   I personally performed the services described in this documentation, which was scribed in my presence. The recorded information has been reviewed and is accurate.   Jeannett Senior, PA-C 12/24/15 [place], MD 12/25/15 2308

## 2015-12-29 ENCOUNTER — Encounter: Payer: Self-pay | Admitting: Family Medicine

## 2015-12-29 ENCOUNTER — Ambulatory Visit (INDEPENDENT_AMBULATORY_CARE_PROVIDER_SITE_OTHER): Admitting: Family Medicine

## 2015-12-29 ENCOUNTER — Ambulatory Visit: Admitting: Family Medicine

## 2015-12-29 VITALS — BP 112/80 | HR 92 | Temp 97.9°F | Ht 63.0 in | Wt 207.1 lb

## 2015-12-29 DIAGNOSIS — IMO0002 Reserved for concepts with insufficient information to code with codable children: Secondary | ICD-10-CM

## 2015-12-29 DIAGNOSIS — E785 Hyperlipidemia, unspecified: Secondary | ICD-10-CM | POA: Diagnosis not present

## 2015-12-29 DIAGNOSIS — F3177 Bipolar disorder, in partial remission, most recent episode mixed: Secondary | ICD-10-CM

## 2015-12-29 DIAGNOSIS — E1140 Type 2 diabetes mellitus with diabetic neuropathy, unspecified: Secondary | ICD-10-CM

## 2015-12-29 DIAGNOSIS — M5442 Lumbago with sciatica, left side: Secondary | ICD-10-CM | POA: Diagnosis not present

## 2015-12-29 DIAGNOSIS — Z23 Encounter for immunization: Secondary | ICD-10-CM | POA: Diagnosis not present

## 2015-12-29 DIAGNOSIS — M2550 Pain in unspecified joint: Secondary | ICD-10-CM

## 2015-12-29 DIAGNOSIS — E1165 Type 2 diabetes mellitus with hyperglycemia: Secondary | ICD-10-CM

## 2015-12-29 NOTE — Progress Notes (Signed)
Pre visit review using our clinic review tool, if applicable. No additional management support is needed unless otherwise documented below in the visit note. 

## 2015-12-29 NOTE — Patient Instructions (Signed)
BEFORE YOU LEAVE: -flu shot -low back exercises -phone number for spine specialist - Charlott Holler -lab visit in 2 months -follow up visit with me in 2-3 months after lab visit  Call today to schedule follow up with your spine specialist regarding your recurrent back issues  In the interim tylenol [phone] mg up to 3 times per day - do not use with norco  Topical sports cream with capsaicin or menthol  Heat for 15 minutes twice daily  Do the exercises 3-4 days per week and keep moving - use proper mechanics as we discussed - lift with knees and pivot with feet instead of twisting back  Work to eat a healthy diet and get regular exercise  We recommend the following healthy lifestyle measures: - eat a healthy whole foods diet consisting of regular small meals composed of vegetables, fruits, beans, nuts, seeds, healthy meats such as white chicken and fish and whole grains.  - avoid sweets, white starchy foods, fried foods, fast food, processed foods, sodas, red meet and other fattening foods.  - get a least 150-300 minutes of aerobic exercise per week.

## 2015-12-29 NOTE — Progress Notes (Signed)
HPI:  Kerri Osborn is a pleasant 55 yo F with PMH sig for DM with neuropathy, bipolar disorder, CAD, obesity, migraines, polyarthralgias (sees Rheumatologist) and prior back and leg pain here for an acute visit for:  Back pain: -seen in ER 1/5 for reports of L low back pain w/ radiating tingling sensation to feet bilaterally after throwing something in the garbage at work -in ED she reportedly refused imaging or referral and was treated with steroids, pain meds and muscle relaxer -reports:doingg much better, but not moving much due to fear, only took norco once and does not want to take and asks about OTC options -denies: weakness, worsening, fevers, malaise, bowel or bladder incontinence -though per ER notes she denies having any sig hx of this in the past, she actually has hx of similar symptoms in 06/2015 following a reported work related injury - she never followed up as advised -she also has had recurrent reported lumbar back pain with radicular symptoms in the past with MRI and lumbar plain films in 2015 and was referred to a spine specialist whom told her MRI did not explain her symptoms - at that time I advised eval with neurology or PMR if persistent and note assisted by back doc at follow up  DM/HLD/Obesity: -uncontrolled diabetes -cholesterol elevated and advised statin - she reports dramatic changes in diet and prefers to recheck in a few months  ROS: See pertinent positives and negatives per HPI.  Past Medical History  Diagnosis Date  . Diabetes mellitus   . Bipolar disorder ([place])   . Asthma   . Coronary artery disease   . Arthritis   . Dyspnea 04/13/2011    pfts 03/2011:  Normal FEV1 and FEV1/FVC Ct chest 2012:  No PE, normal parenchyma [name] 04/18/2011 with active symptoms:  Normal FEV1%, mild decrease in FVC due to restriction vs airtrapping, normal appearing       flow volume loop.    . Depression   . Chicken pox   . Seasonal allergies   . Migraines   . Positive TB  test     per patient reaction   . Allergy   . Myocardial infarction ([place])   . Diverticulosis   . Fatty liver   . Polyarthralgia - managed by Dr. Ouida Sills per her report 02/05/2013  . Arthritis, rheumatoid [facility])     Past Surgical History  Procedure Laterality Date  . Abdominal hysterectomy    . Tubal ligation    . Appendectomy    . Myomectomy    . Cesarean section    . Breast cyst excision    . Inner ear surgery      tubes in ears    Family History  Problem Relation Age of Onset  . Allergies Sister   . Allergies Daughter   . Allergies Daughter   . Asthma Daughter   . Asthma Daughter   . Arthritis Maternal Grandmother   . Hyperlipidemia Maternal Grandmother   . Diabetes Maternal Grandmother   . Hypertension Maternal Aunt   . Sudden death Mother   . Kidney failure Mother   . Sudden death Father   . Mental illness    . Colon cancer Neg Hx   . Rectal cancer Neg Hx   . Stomach cancer Neg Hx     Social History   Social History  . Marital Status: Married    Spouse Name: N/A  . Number of Children: 2  . Years of Education: N/A  Occupational History  . massage therapist and Esthetician    Social History Main Topics  . Smoking status: Never Smoker   . Smokeless tobacco: Never Used  . Alcohol Use: No  . Drug Use: No  . Sexual Activity: Not Asked   Other Topics Concern  . 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      Spiritual Beliefs:       Lifestyle: MWF does kickboxing, vegetarian - trying to go vegan              Current outpatient prescriptions:  .  albuterol (PROVENTIL HFA;VENTOLIN HFA) 108 (90 BASE) MCG/ACT inhaler, Inhale 2 puffs into the lungs every 6 (six) hours as needed for wheezing or shortness of breath., Disp: 18 g, Rfl: 5 .  busPIRone (BUSPAR) 15 MG tablet, Take 1/2 tablet by mouth 2 times daily. (Patient taking differently: Take 10  mg by mouth 3 (three) times daily. ), Disp: 90 tablet, Rfl: 1 .  glucose blood (FREESTYLE LITE) test strip, Use to test blood sugar 2 times daily as instructed., Disp: 200 each, Rfl: 0 .  HYDROcodone-acetaminophen (NORCO) 5-325 MG tablet, Take 1 tablet by mouth every 6 (six) hours as needed for moderate pain., Disp: 20 tablet, Rfl: 0 .  insulin aspart (NOVOLOG FLEXPEN) 100 UNIT/ML FlexPen, Inject 6-8 Units into the skin 3 (three) times daily with meals. (Patient taking differently: Inject 10 Units into the skin 3 (three) times daily with meals. ), Disp: 15 mL, Rfl: 5 .  Insulin Glargine (LANTUS SOLOSTAR) 100 UNIT/ML Solostar Pen, Inject 26 Units into the skin daily at 10 pm., Disp: 5 pen, Rfl: 2 .  Insulin Pen Needle (CAREFINE PEN NEEDLES) 32G X 4 MM MISC, Use to inject insulin 4 times daily., Disp: 360 each, Rfl: 0 .  lamoTRIgine (LAMICTAL) 100 MG tablet, Take 1 tablet (100 mg total) by mouth daily., Disp: 90 tablet, Rfl: 1 .  Lancets (FREESTYLE) lancets, Use to test blood sugar 2 times daily as instructed., Disp: 200 each, Rfl: 0 .  meclizine (ANTIVERT) 25 MG tablet, Take 1 tablet (25 mg total) by mouth 3 (three) times daily as needed for dizziness., Disp: 20 tablet, Rfl: 0 .  metFORMIN (GLUCOPHAGE) 1000 MG tablet, Take 1 tablet (1,000 mg total) by mouth 2 (two) times daily with a meal., Disp: 180 tablet, Rfl: 1 .  methocarbamol (ROBAXIN) 500 MG tablet, Take 1 tablet (500 mg total) by mouth 2 (two) times daily., Disp: 20 tablet, Rfl: 0 .  predniSONE (DELTASONE) 20 MG tablet, Take 2 tablets (40 mg total) by mouth daily., Disp: 10 tablet, Rfl: 0 .  sertraline (ZOLOFT) 100 MG tablet, Take 1 tablet (100 mg total) by mouth daily., Disp: 90 tablet, Rfl: 1  EXAM:  Filed Vitals:   12/29/15 1034  BP: 112/80  Pulse: 92  Temp: 97.9 F (36.6 C)    Body mass index is 36.7 kg/(m^2).  GENERAL: vitals reviewed and listed above, alert, oriented, appears well hydrated and in no acute distress  HEENT:  atraumatic, conjunttiva clear, no obvious abnormalities on inspection of external nose and ears  NECK: no obvious masses on inspection  LUNGS: clear to auscultation bilaterally, no wheezes, rales or rhonchi, good air movement  CV: HRRR, no peripheral edema  MS: moves all extremities without noticeable abnormality Gait is guarded Normal Gait  Normal inspection of back, no obvious scoliosis or leg length descrepancy No bony TTP Soft tissue TTP at: bilat lumbar paraspinal muscles -/+ tests: neg trendelenburg,-facet loading, -SLRT, -CLRT, -FABER, -FADIR Normal muscle strength, sensation to light touch and DTRs in LEs bilaterally  PSYCH: pleasant and cooperative, no obvious depression or anxiety  ASSESSMENT AND PLAN:  Discussed the following assessment and plan:  Left-sided low back pain with left-sided sciatica -improving, no neurodeficits on exam, hx recurrent similar symptoms -advise proper twisting and bending mechanics and demonstrated -HEP, analgesic options and safe dosing/risks discussed, advised follow up with her back specialist and number provided to schedule appt  DM type 2, uncontrolled, with neuropathy ([place]) -uncontrolled, advise importance of control of diabetes, congratulated on work on healthier lifestyle, cont management with endo  Hyperlipemia -advised statin, she refused -she opted to recheck in a few months  Polyarthralgia - managed by Dr. Ouida Sills per her report -sees specialist  Bipolar disorder, in partial remission, most recent episode mixed ([place]) -stable  -Patient advised to return or notify a doctor immediately if symptoms worsen or persist or new concerns arise.  Patient Instructions  BEFORE YOU LEAVE: -flu shot -low back exercises -phone number for spine specialist - Charlott Holler -lab visit in 2 months -follow up visit with me in 2-3 months after lab visit  Call today to schedule follow up with your spine specialist regarding  your recurrent back issues  In the interim tylenol [phone] mg up to 3 times per day - do not use with norco  Topical sports cream with capsaicin or menthol  Heat for 15 minutes twice daily  Do the exercises 3-4 days per week and keep moving - use proper mechanics as we discussed - lift with knees and pivot with feet instead of twisting back  Work to eat a healthy diet and get regular exercise  We recommend the following healthy lifestyle measures: - eat a healthy whole foods diet consisting of regular small meals composed of vegetables, fruits, beans, nuts, seeds, healthy meats such as white chicken and fish and whole grains.  - avoid sweets, white starchy foods, fried foods, fast food, processed foods, sodas, red meet and other fattening foods.  - get a least 150-300 minutes of aerobic exercise per week.       Colin Benton R.

## 2016-01-19 ENCOUNTER — Other Ambulatory Visit: Payer: Self-pay | Admitting: Internal Medicine

## 2016-01-26 ENCOUNTER — Other Ambulatory Visit: Payer: Self-pay | Admitting: Internal Medicine

## 2016-02-15 ENCOUNTER — Ambulatory Visit: Admitting: Internal Medicine

## 2016-02-22 ENCOUNTER — Ambulatory Visit: Admitting: Internal Medicine

## 2016-02-26 ENCOUNTER — Other Ambulatory Visit: Payer: Self-pay | Admitting: Family Medicine

## 2016-02-26 ENCOUNTER — Other Ambulatory Visit (INDEPENDENT_AMBULATORY_CARE_PROVIDER_SITE_OTHER)

## 2016-02-26 DIAGNOSIS — E1165 Type 2 diabetes mellitus with hyperglycemia: Secondary | ICD-10-CM | POA: Diagnosis not present

## 2016-02-26 DIAGNOSIS — R319 Hematuria, unspecified: Secondary | ICD-10-CM | POA: Diagnosis not present

## 2016-02-26 DIAGNOSIS — E1140 Type 2 diabetes mellitus with diabetic neuropathy, unspecified: Secondary | ICD-10-CM

## 2016-02-26 DIAGNOSIS — IMO0002 Reserved for concepts with insufficient information to code with codable children: Secondary | ICD-10-CM

## 2016-02-26 DIAGNOSIS — E785 Hyperlipidemia, unspecified: Secondary | ICD-10-CM | POA: Diagnosis not present

## 2016-02-26 LAB — POCT URINALYSIS DIPSTICK
BILIRUBIN UA: NEGATIVE
Blood, UA: NEGATIVE
Glucose, UA: NEGATIVE
Ketones, UA: NEGATIVE
LEUKOCYTES UA: NEGATIVE
NITRITE UA: NEGATIVE
PH UA: 7
Spec Grav, UA: 1.025
Urobilinogen, UA: 0.2

## 2016-02-26 LAB — BASIC METABOLIC PANEL
BUN: 16 mg/dL (ref 6–23)
CHLORIDE: 104 meq/L (ref 96–112)
CO2: 30 mEq/L (ref 19–32)
Calcium: 9.5 mg/dL (ref 8.4–10.5)
Creatinine, Ser: 0.59 mg/dL (ref 0.40–1.20)
GFR: 137.45 mL/min (ref >60.00)
GLUCOSE: 166 mg/dL — AB (ref 70–99)
POTASSIUM: 4 meq/L (ref 3.5–5.1)
SODIUM: 141 meq/L (ref 135–145)

## 2016-02-26 LAB — LIPID PANEL
CHOLESTEROL: 257 mg/dL — AB (ref 0–200)
HDL: 50.1 mg/dL (ref >39.00)
LDL Cholesterol: 187 mg/dL — ABNORMAL HIGH (ref 0–99)
NonHDL: 206.9
TRIGLYCERIDES: 101 mg/dL (ref 0.0–149.0)
Total CHOL/HDL Ratio: 5
VLDL: 20.2 mg/dL (ref 0.0–40.0)

## 2016-02-26 LAB — HEMOGLOBIN A1C: HEMOGLOBIN A1C: 7.6 % — AB (ref 4.6–6.5)

## 2016-02-26 LAB — URINALYSIS, MICROSCOPIC ONLY: RBC / HPF: NONE SEEN (ref 0–?)

## 2016-03-04 ENCOUNTER — Ambulatory Visit (INDEPENDENT_AMBULATORY_CARE_PROVIDER_SITE_OTHER): Admitting: Family Medicine

## 2016-03-04 DIAGNOSIS — R69 Illness, unspecified: Secondary | ICD-10-CM

## 2016-03-04 NOTE — Progress Notes (Signed)
NO SHOW: On review of chart prior to appointment:  Kerri Osborn is a pleasant 55 yo F with PMH sig for DM with neuropathy, bipolar disorder, CAD, obesity, migraines, polyarthralgias (sees Rheumatologist) and prior back and leg pain.  DM type 2, uncontrolled, with neuropathy ([place]) -managed by endorinology -meds:lantus 26 units, metformin 1000 bid, novolog 6-8 units nightly  Hyperlipemia -advised statin, she refused -she opted to recheck in a few months  Polyarthralgia - managed by Dr. Ouida Sills per her report -sees specialist -meds: gabapentin, percocet, lamital, hydroxychloroquine in the past  Bipolar disorder, in partial remission, most recent episode mixed [facility]) Managed by psychiatrist - [facility] -meds: lamictal, buspar, and zoloft  Left-sided low back pain with left-sided sciatica -intermittent back issues, sees spine specialist (Dr. Leonarda Salon) -had MRI in 2015 -flare in pain, 2015, 2016 and 2017

## 2016-03-31 ENCOUNTER — Ambulatory Visit (INDEPENDENT_AMBULATORY_CARE_PROVIDER_SITE_OTHER): Admitting: Family Medicine

## 2016-03-31 ENCOUNTER — Encounter: Payer: Self-pay | Admitting: Family Medicine

## 2016-03-31 VITALS — BP 100/72 | HR 81 | Temp 98.5°F | Ht 63.0 in | Wt 204.5 lb

## 2016-03-31 DIAGNOSIS — E785 Hyperlipidemia, unspecified: Secondary | ICD-10-CM | POA: Diagnosis not present

## 2016-03-31 DIAGNOSIS — IMO0002 Reserved for concepts with insufficient information to code with codable children: Secondary | ICD-10-CM

## 2016-03-31 DIAGNOSIS — E1165 Type 2 diabetes mellitus with hyperglycemia: Secondary | ICD-10-CM | POA: Diagnosis not present

## 2016-03-31 DIAGNOSIS — E669 Obesity, unspecified: Secondary | ICD-10-CM | POA: Diagnosis not present

## 2016-03-31 DIAGNOSIS — E1140 Type 2 diabetes mellitus with diabetic neuropathy, unspecified: Secondary | ICD-10-CM | POA: Diagnosis not present

## 2016-03-31 MED ORDER — ROSUVASTATIN CALCIUM 20 MG PO TABS: 20.0000 mg | ORAL_TABLET | Freq: Every day | ORAL | Status: DC

## 2016-03-31 NOTE — Patient Instructions (Addendum)
Before you leave: -Schedule follow up in 4 months  Start the cholesterol medicine, Crestor, and take once daily.  Schedule your yearly eye exam.  Take sure that he follow up with your diabetes doctor seen.  Continue to work on a healthy diet and regular aerobic exercise. We recommend the following healthy lifestyle measures: - eat a healthy whole foods diet consisting of regular small meals composed of vegetables, fruits, beans, nuts, seeds, healthy meats such as white chicken and fish and whole grains.  - avoid sweets, white starchy foods, fried foods, fast food, processed foods, sodas, red meet and other fattening foods.  - get a least 150-300 minutes of aerobic exercise per week.   Please follow up with your back specialist, Dr. Sherlyn Lick, for any recurrent or persistent back issues.

## 2016-03-31 NOTE — Progress Notes (Signed)
Pre visit review using our clinic review tool, if applicable. No additional management support is needed unless otherwise documented below in the visit note. 

## 2016-03-31 NOTE — Progress Notes (Signed)
HPI:  Kerri Osborn is a pleasant 55 yo F with PMH sig for DM with neuropathy, bipolar disorder, CAD, obesity, migraines, polyarthralgias (sees Rheumatologist) and prior back and leg pain her for a follow up visit. She did not show up for her last visit, and reports today that she overslept.  DM type 2, uncontrolled, with neuropathy ([place]) -managed by endo, improving on last check, reports she is improving her diet -meds:lantus 26 units, metformin 1000 bid, novolog 6-8 units nightly  Hyperlipemia -advised statin, she refused in the past - she finally agreed today  Polyarthralgia - managed by Dr. Ouida Sills per her report -sees specialist -meds: gabapentin, percocet, lamital, hydroxychloroquine in the past  Bipolar disorder, in partial remission, most recent episode mixed ([place]) Managed by psychiatrist - [facility] -meds: lamictal, buspar, and zoloft  Left-sided low back pain with left-sided sciatica: -stable -intermittent back issues, sees spine specialist (Dr. Leonarda Salon) -had MRI in 2015 -flare in pain, 2015, 2016 and 2017 - has not tried HEP, tylenol or treatments we discussed - she is planning to see her specialist  ROS: See pertinent positives and negatives per HPI.  Past Medical History  Diagnosis Date  . Diabetes mellitus   . Bipolar disorder ([place])   . Asthma   . Coronary artery disease   . Arthritis   . Dyspnea 04/13/2011    pfts 03/2011:  Normal FEV1 and FEV1/FVC Ct chest 2012:  No PE, normal parenchyma [name] 04/18/2011 with active symptoms:  Normal FEV1%, mild decrease in FVC due to restriction vs airtrapping, normal appearing       flow volume loop.    . Depression   . Chicken pox   . Seasonal allergies   . Migraines   . 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 Allergy   . Myocardial infarction ([place])   . Diverticulosis   . Fatty liver   . Polyarthralgia - managed by Dr. Ouida Sills per her report 02/05/2013  . Arthritis, rheumatoid [facility])     Past Surgical  History  Procedure Laterality Date  . Abdominal hysterectomy    . Tubal ligation    . Appendectomy    . Myomectomy    . Cesarean section    . Breast cyst excision    . Inner ear surgery      tubes in ears    Family History  Problem Relation Age of Onset  . Allergies Sister   . Allergies Daughter   . Allergies Daughter   . Asthma Daughter   . Asthma Daughter   . Arthritis Maternal Grandmother   . Hyperlipidemia Maternal Grandmother   . Diabetes Maternal Grandmother   . Hypertension Maternal Aunt   . Sudden death Mother   . Kidney failure Mother   . Sudden death Father   . Mental illness    . Colon cancer Neg Hx   . Rectal cancer Neg Hx   . Stomach cancer Neg Hx     Social History   Social History  . Marital Status: Married    Spouse Name: N/A  . Number of Children: 2  . Years of Education: N/A   Occupational History  . massage therapist and Esthetician    Social History Main Topics  . Smoking status: Never Smoker   . Smokeless tobacco: Never Used  . Alcohol Use: No  . Drug Use: No  . Sexual Activity: Not Asked   Other Topics Concern  . 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      Spiritual Beliefs:       Lifestyle: MWF does kickboxing, vegetarian - trying to go vegan              Current outpatient prescriptions:  .  albuterol (PROVENTIL HFA;VENTOLIN HFA) 108 (90 BASE) MCG/ACT inhaler, Inhale 2 puffs into the lungs every 6 (six) hours as needed for wheezing or shortness of breath., Disp: 18 g, Rfl: 5 .  busPIRone (BUSPAR) 15 MG tablet, Take 1/2 tablet by mouth 2 times daily. (Patient taking differently: Take 10 mg by mouth 3 (three) times daily. ), Disp: 90 tablet, Rfl: 1 .  FREESTYLE LITE test strip, USE TO TEST BLOOD SUGAR TWICE A DAY AS INSTRUCTED, Disp: 200 each, Rfl: 3 .  Insulin Pen Needle (CAREFINE PEN NEEDLES) 32G X 4 MM MISC, Use to  inject insulin 4 times daily., Disp: 360 each, Rfl: 0 .  lamoTRIgine (LAMICTAL) 100 MG tablet, Take 1 tablet (100 mg total) by mouth daily., Disp: 90 tablet, Rfl: 1 .  Lancets (FREESTYLE) lancets, Use to test blood sugar 2 times daily as instructed., Disp: 200 each, Rfl: 0 .  LANTUS SOLOSTAR 100 UNIT/ML Solostar Pen, INJECT 26 UNITS UNDER THE SKIN DAILY AT 10 P.M., Disp: 30 mL, Rfl: 0 .  metFORMIN (GLUCOPHAGE) 1000 MG tablet, Take 1 tablet (1,000 mg total) by mouth 2 (two) times daily with a meal., Disp: 180 tablet, Rfl: 1 .  sertraline (ZOLOFT) 100 MG tablet, Take 1 tablet (100 mg total) by mouth daily., Disp: 90 tablet, Rfl: 1 .  rosuvastatin (CRESTOR) 20 MG tablet, Take 1 tablet (20 mg total) by mouth daily., Disp: 90 tablet, Rfl: 1  EXAM:  Filed Vitals:   03/31/16 1117  BP: 100/72  Pulse: 81  Temp: 98.5 F (36.9 C)    Body mass index is 36.23 kg/(m^2).  GENERAL: vitals reviewed and listed above, alert, oriented, appears well hydrated and in no acute distress  HEENT: atraumatic, conjunttiva clear, no obvious abnormalities on inspection of external nose and ears  NECK: no obvious masses on inspection  LUNGS: clear to auscultation bilaterally, no wheezes, rales or rhonchi, good air movement  CV: HRRR, no peripheral edema  MS: moves all extremities without noticeable abnormality  PSYCH: pleasant and cooperative, no obvious depression or anxiety  ASSESSMENT AND PLAN:  Discussed the following assessment and plan:  Hyperlipemia -reviewed recent lab results with her -discussed significant cardiovascular risk associated with her diabetes, and elevated cholesterol -Discussed treatment options and risk -She finally agreed to start a cholesterol medication - prescription sent to pharmacy -Also advised a healthy diet and regular aerobic exercise  DM type 2, uncontrolled, with neuropathy ([place]) -managed by endocrinologist, reviewed recent lab results with her -Advised a healthy  low carb diet, regular aerobic exercisein follow-up with her specialist -Advised an annual diabetic eye exam  Obesity -Advised a healthy diet and regular exercise  Advise she schedule follow-up with her back specialist regarding her chronic low back pain.  -Patient advised to return or notify a doctor immediately if symptoms worsen or persist or new concerns arise.  Patient Instructions  Before you leave: -Schedule follow up in 4 months  Start the cholesterol medicine, Crestor, and take once daily.  Schedule your yearly eye exam.  Take sure that he follow up with your diabetes  doctor seen.  Continue to work on a healthy diet and regular aerobic exercise. We recommend the following healthy lifestyle measures: - eat a healthy whole foods diet consisting of regular small meals composed of vegetables, fruits, beans, nuts, seeds, healthy meats such as white chicken and fish and whole grains.  - avoid sweets, white starchy foods, fried foods, fast food, processed foods, sodas, red meet and other fattening foods.  - get a least 150-300 minutes of aerobic exercise per week.   Please follow up with your back specialist, Dr. Sherlyn Lick, for any recurrent or persistent back issues.     Colin Benton R.

## 2016-04-07 ENCOUNTER — Other Ambulatory Visit: Payer: Self-pay | Admitting: Family Medicine

## 2016-04-07 DIAGNOSIS — E785 Hyperlipidemia, unspecified: Secondary | ICD-10-CM

## 2016-04-07 DIAGNOSIS — E669 Obesity, unspecified: Secondary | ICD-10-CM

## 2016-04-07 NOTE — Telephone Encounter (Signed)
I do recommend a decrease in LDL of >50% from the pretreatment LDL range. She has diabetes and coronary artery disease and an LDL of 187. I am not sure what guidelines they use or what information the have, but please provide them with this information and if they still choose not to cover this medication then please ask them what medications they are willing to cover and we will let the pt know that they refused to cover what the doctor recommended and will let her know what her options are. Thanks.

## 2016-04-07 NOTE — Telephone Encounter (Signed)
Prior authorization for rosuvastatin (CRESTOR) 20 MG tablet has been denied by [employer] stating:  After reviewing the information provided it has been determined that this request cannot be approved.  Coverage is provided in situations where the patient requires an LDL cholesterol decrease of more than 50% from the pre-treatment LDL level.  Coverage cannot be authorized at this time.

## 2016-04-08 ENCOUNTER — Encounter: Payer: Self-pay | Admitting: Family Medicine

## 2016-04-08 NOTE — Telephone Encounter (Signed)
Letter of appeal submitted to [place], will await response.

## 2016-04-13 NOTE — Telephone Encounter (Signed)
Have not received response from [employer] regarding outcome of appeal.  However, fax received today(04/13/16) from [facility] [phone] stating per [employer] patient must try Atorvastatin >=40 mg or Simvastatin 80 mg first.

## 2016-04-14 ENCOUNTER — Other Ambulatory Visit: Payer: Self-pay | Admitting: *Deleted

## 2016-04-14 DIAGNOSIS — E669 Obesity, unspecified: Secondary | ICD-10-CM | POA: Insufficient documentation

## 2016-04-14 DIAGNOSIS — E785 Hyperlipidemia, unspecified: Secondary | ICD-10-CM | POA: Insufficient documentation

## 2016-04-14 MED ORDER — ATORVASTATIN CALCIUM 40 MG PO TABS: 40.0000 mg | ORAL_TABLET | Freq: Every day | ORAL | Status: DC

## 2016-04-14 NOTE — Telephone Encounter (Signed)
Rx done and I called the pt and left a detailed message with the information below and to call back if she has any questions.

## 2016-04-14 NOTE — Telephone Encounter (Signed)
Thanks. Ok. Please send atorvastatin 40mg , QD per order to pharmacy of her choice to use instead of the crestor and let her know. Thanks.

## 2016-04-14 NOTE — Telephone Encounter (Signed)
Rx changed from Crestor to Atorvastatin due to insurance denial.

## 2016-04-14 NOTE — Addendum Note (Signed)
Addended by: Lucretia Kern on: 04/14/2016 12:57 PM   Modules accepted: Orders, Medications

## 2016-04-19 ENCOUNTER — Ambulatory Visit (INDEPENDENT_AMBULATORY_CARE_PROVIDER_SITE_OTHER): Admitting: Family Medicine

## 2016-04-19 ENCOUNTER — Encounter: Payer: Self-pay | Admitting: Family Medicine

## 2016-04-19 ENCOUNTER — Telehealth: Payer: Self-pay | Admitting: Family Medicine

## 2016-04-19 VITALS — BP 130/84 | HR 86 | Temp 98.2°F | Wt 205.0 lb

## 2016-04-19 DIAGNOSIS — E1140 Type 2 diabetes mellitus with diabetic neuropathy, unspecified: Secondary | ICD-10-CM | POA: Diagnosis not present

## 2016-04-19 DIAGNOSIS — E1165 Type 2 diabetes mellitus with hyperglycemia: Secondary | ICD-10-CM | POA: Diagnosis not present

## 2016-04-19 DIAGNOSIS — E669 Obesity, unspecified: Secondary | ICD-10-CM

## 2016-04-19 DIAGNOSIS — IMO0002 Reserved for concepts with insufficient information to code with codable children: Secondary | ICD-10-CM

## 2016-04-19 DIAGNOSIS — E785 Hyperlipidemia, unspecified: Secondary | ICD-10-CM | POA: Diagnosis not present

## 2016-04-19 NOTE — Progress Notes (Signed)
Pre visit review using our clinic review tool, if applicable. No additional management support is needed unless otherwise documented below in the visit note. 

## 2016-04-19 NOTE — Progress Notes (Addendum)
HPI:  Kerri Osborn is a pleasant 55 year old with a complicated past medical history significant for bipolar disorder, diabetes - managed by her endocrinologist, coronary artery disease, hyperlipidemia, obesity and poor compliance with care recommendations here for an acute visit for low blood sugars. She has not seen her endocrinologist for follow-up recently. Her labs at a follow-up visit recently showed Hgba1c at 7.6. She reports she has followed our instructions and is eating much better. She eliminated sweets and simple starch food about 2 weeks ago and stopped her metformin. She continues to take Lantus 26 units daily and mealtime insulin 10 units with each meal. Since, she reports her sugars are "running very low" . Fasting blood sugars 124, 81, 91, 91, 107, 104, 102, 100, 85 and one 65. She has not felt bad at all and actually reports she has been feeling really good. She has not been checking her postprandial blood sugars.Her cholesterol was quite high on her last set of labs. She reports this is because she has not been taking her cholesterol medication, she still has not picked this up from the pharmacy but does plan to pick it up sooner. She is eating a couple tablespoons of coconut oil per day along with a healthy diet.  ROS: See pertinent positives and negatives per HPI.  Past Medical History:  Diagnosis Date  . Allergy   . Arthritis   . Arthritis, rheumatoid ([place])   . Asthma   . Bipolar disorder ([place])   . Chicken pox   . Coronary artery disease   . Depression   . Diabetes mellitus   . Diverticulosis   . Dyspnea 04/13/2011   pfts 03/2011:  Normal FEV1 and FEV1/FVC Ct chest 2012:  No PE, normal parenchyma [name] 04/18/2011 with active symptoms:  Normal FEV1%, mild decrease in FVC due to restriction vs airtrapping, normal appearing       flow volume loop.    . Fatty liver   . Migraines   . Myocardial infarction ([place])   . Polyarthralgia - managed by Dr. Ouida Sills per her report  02/05/2013  . 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 Seasonal allergies     Past Surgical History:  Procedure Laterality Date  . ABDOMINAL HYSTERECTOMY    . APPENDECTOMY    . BREAST CYST EXCISION    . CESAREAN SECTION    . INNER EAR SURGERY     tubes in ears  . MYOMECTOMY    . TUBAL LIGATION      Family History  Problem Relation Age of Onset  . Allergies Sister   . Allergies Daughter   . Allergies Daughter   . Asthma Daughter   . Asthma Daughter   . Arthritis Maternal Grandmother   . Hyperlipidemia Maternal Grandmother   . Diabetes Maternal Grandmother   . Hypertension Maternal Aunt   . Sudden death Mother   . Kidney failure Mother   . Sudden death Father   . Mental illness    . Colon cancer Neg Hx   . Rectal cancer Neg Hx   . Stomach cancer Neg Hx   . Neuropathy Neg Hx     Social History   Social History  . Marital status: Widowed    Spouse name: N/A  . Number of children: 2  . Years of education: 16   Occupational History  . [employer]     Social History Main Topics  . Smoking status: Never Smoker  . Smokeless  tobacco: Never Used  . Alcohol use No  . Drug use: No  . Sexual activity: Not Asked   Other Topics Concern  . 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      Spiritual Beliefs:       Lifestyle: MWF does kickboxing, vegetarian - trying to go vegan      Caffeine use: Drinks tea rarely                     Current Outpatient Prescriptions:  .  albuterol (PROVENTIL HFA;VENTOLIN HFA) 108 (90 BASE) MCG/ACT inhaler, Inhale 2 puffs into the lungs every 6 (six) hours as needed for wheezing or shortness of breath., Disp: 18 g, Rfl: 5 .  busPIRone (BUSPAR) 15 MG tablet, Take 1/2 tablet by mouth 2 times daily., Disp: 90 tablet, Rfl: 1 .  FREESTYLE LITE test strip, USE TO TEST BLOOD SUGAR TWICE A DAY AS INSTRUCTED, Disp: 200 each, Rfl: 3 .   Insulin Pen Needle (CAREFINE PEN NEEDLES) 32G X 4 MM MISC, Use to inject insulin 4 times daily., Disp: 360 each, Rfl: 0 .  lamoTRIgine (LAMICTAL) 100 MG tablet, Take 1 tablet (100 mg total) by mouth daily., Disp: 90 tablet, Rfl: 1 .  Lancets (FREESTYLE) lancets, Use to test blood sugar 2 times daily as instructed., Disp: 200 each, Rfl: 0 .  sertraline (ZOLOFT) 100 MG tablet, Take 1 tablet (100 mg total) by mouth daily., Disp: 90 tablet, Rfl: 1 .  CINNAMON PO, Take 1 tablet by mouth daily. , Disp: , Rfl:  .  COCONUT OIL PO, Take 1 tablet by mouth daily. , Disp: , Rfl:  .  cyclobenzaprine (FLEXERIL) 10 MG tablet, Take 1 tablet (10 mg total) by mouth 3 (three) times daily as needed for muscle spasms., Disp: 90 tablet, Rfl: 3 .  gabapentin (NEURONTIN) 300 MG capsule, Take 1 capsule (300 mg total) by mouth 3 (three) times daily., Disp: 90 capsule, Rfl: 11 .  insulin aspart (NOVOLOG FLEXPEN) 100 UNIT/ML FlexPen, Inject 5 Units into the skin 3 (three) times daily with meals., Disp: , Rfl:  .  Insulin Glargine (LANTUS SOLOSTAR) 100 UNIT/ML Solostar Pen, INJECT 20 UNITS UNDER THE SKIN DAILY AT 10 P.M., Disp: 30 mL, Rfl: 0 .  TURMERIC PO, Take 2 mLs by mouth daily., Disp: , Rfl:   EXAM:  Vitals:   04/19/16 1645  BP: 130/84  Pulse: 86  Temp: 98.2 F (36.8 C)    Body mass index is 36.31 kg/m.  GENERAL: vitals reviewed and listed above, alert, oriented, appears well hydrated and in no acute distress  HEENT: atraumatic, conjunttiva clear, no obvious abnormalities on inspection of external nose and ears  NECK: no obvious masses on inspection  LUNGS: clear to auscultation bilaterally, no wheezes, rales or rhonchi, good air movement  CV: HRRR, no peripheral edema  MS: moves all extremities without noticeable abnormality  PSYCH: pleasant and cooperative, no obvious depression or anxiety  ASSESSMENT AND PLAN:  Discussed the following assessment and plan: Greater than 40 minutes spent with this  patient with greater than 50% spent face-to-face in counseling:  DM type 2, uncontrolled, with neuropathy ([place])  Hyperlipidemia  Obesity  -Congratulated her on lifestyle changes and discussed at length -Advised regular small healthy meals and that she check her fasting and postprandial blood sugars and call with any low sugars -  Management of hypoglycemia discussed -My preference would be that she stay on metformin, and reduce her insulin. She plans to discuss further with her endocrinologist for adding back and may reduce her insulin if needed. -Did suggest that the coconut oil may not be the best choice right now with her cholesterol levels. She agrees to restart her cholesterol medicine and use other healthy fats instead - olive oil, nuts and seeds, fish, etc. -Patient advised to return or notify a doctor immediately if symptoms worsen or persist or new concerns arise.  Patient Instructions  Congratulations on the diet changes! I am so happy for you!  Call to schedule follow-up with her endocrinologist about your diabetes medications.  Please monitor your blood sugars: Fasting daily And postprandial sugars, 1-2 hours after each meal. Keep a log and take this log to your endocrinology follow-up appointment.  A normal fasting blood sugar is between 70 and 130  Goal postprandial blood sugars are 70-170  A low blood sugar is anything under 70. If you see a low blood sugar please eat a small snack (1/2 cup fruit or juice, 1 cup milk, 2 tablespoons raisins, 1 tablespoon sugar or honey, etc) as we discussed  immediately and contact your endocrinologist or our office immediately. Recheck Blood sugar in 30 minutes.  We recommend the following healthy lifestyle measures: - eat a healthy whole foods diet consisting of regular small meals composed of vegetables, fruits, beans, nuts, seeds, healthy meats such as white chicken and fish and whole grains.  - avoid sweets, white starchy foods,  fried foods, fast food, processed foods, sodas, red meet and other fattening foods.  - get a least 150-300 minutes of aerobic exercise per week.      Colin Benton R.

## 2016-04-19 NOTE — Telephone Encounter (Signed)
Scheduled to see Dr. Maudie Mercury at 4:30 today

## 2016-04-19 NOTE — Telephone Encounter (Signed)
Patient Name: Kerri Osborn DOB: Jul 01, 1961 Initial Comment Caller states made changes to her diet, stopped taking Metformin, her blood sugar numbers are dropping, today was 65 at 11 am Nurse Assessment Nurse: Roosvelt Maser, RN, Barnetta Chapel Date/Time (Eastern Time): 04/19/2016 12:35:08 PM Confirm and document reason for call. If symptomatic, describe symptoms. You must click the next button to save text entered. ---caller states she has been watching her diet and her sugars have been steadily decreasing. she has stopped taking her metformin and is still taking insulin in AM and PM. sugars have been running low now was 65 this morning Has the patient traveled out of the country within the last 30 days? ---Not Applicable Does the patient have any new or worsening symptoms? ---Yes Will a triage be completed? ---Yes Related visit to physician within the last 2 weeks? ---No Does the PT have any chronic conditions? (i.e. diabetes, asthma, etc.) ---Unknown Is the patient pregnant or possibly pregnant? (Ask all females between the ages of 55-55) ---No Is this a behavioral health or substance abuse call? ---No Guidelines Guideline Title Affirmed Question Affirmed Notes Diabetes - Low Blood Sugar [1] Blood glucose < 70 mg/dl (3.9 mmol/ l) or symptomatic with other adult present AND [2] cause unknown food, strenuous exercise.) Final Disposition User Call PCP within 24 Hours Roosvelt Maser, RN, Barnetta Chapel Comments appt 430 with PCP Referrals REFERRED TO PCP OFFICE Disagree/Comply: Comply

## 2016-04-19 NOTE — Patient Instructions (Addendum)
Congratulations on the diet changes! I am so happy for you!  Call to schedule follow-up with her endocrinologist about your diabetes medications.  Please monitor your blood sugars: Fasting daily And postprandial sugars, 1-2 hours after each meal. Keep a log and take this log to your endocrinology follow-up appointment.  A normal fasting blood sugar is between 70 and 130  Goal postprandial blood sugars are 70-170  A low blood sugar is anything under 70. If you see a low blood sugar please eat a small snack (1/2 cup fruit or juice, 1 cup milk, 2 tablespoons raisins, 1 tablespoon sugar or honey, etc) as we discussed  immediately and contact your endocrinologist or our office immediately. Recheck Blood sugar in 30 minutes.  We recommend the following healthy lifestyle measures: - eat a healthy whole foods diet consisting of regular small meals composed of vegetables, fruits, beans, nuts, seeds, healthy meats such as white chicken and fish and whole grains.  - avoid sweets, white starchy foods, fried foods, fast food, processed foods, sodas, red meet and other fattening foods.  - get a least 150-300 minutes of aerobic exercise per week.

## 2016-05-02 ENCOUNTER — Encounter: Payer: Self-pay | Admitting: Internal Medicine

## 2016-05-02 ENCOUNTER — Ambulatory Visit (INDEPENDENT_AMBULATORY_CARE_PROVIDER_SITE_OTHER): Admitting: Internal Medicine

## 2016-05-02 VITALS — BP 118/70 | HR 92 | Temp 98.0°F | Ht 63.0 in | Wt 206.0 lb

## 2016-05-02 DIAGNOSIS — E785 Hyperlipidemia, unspecified: Secondary | ICD-10-CM

## 2016-05-02 DIAGNOSIS — Z794 Long term (current) use of insulin: Secondary | ICD-10-CM

## 2016-05-02 DIAGNOSIS — E1165 Type 2 diabetes mellitus with hyperglycemia: Secondary | ICD-10-CM

## 2016-05-02 MED ORDER — INSULIN GLARGINE 100 UNIT/ML SOLOSTAR PEN: PEN_INJECTOR | SUBCUTANEOUS | Status: DC

## 2016-05-02 NOTE — Progress Notes (Signed)
Subjective:     Patient ID: Kerri Osborn, female   DOB: December 01, 1961, 55 y.o.   MRN: [id number]  HPI Kerri Osborn is a 55 y.o. woman, returning for f/u for DM2, dx. [id number], without complications, uncontrolled, insulin-dependent since 2012. Last visit 5.5 mo ago.  She started a Ketogenic diet last month >> sugars lower now.  Her last A1c was: Lab Results  Component Value Date   HGBA1C 7.6* 02/26/2016   HGBA1C 8.9 11/17/2015   HGBA1C 8.0* 06/25/2015   Previous treatment regimen: - VGo 20 + 3 clicks per meal - since last year >> bruising and numbness >> would like switch to injections - JanuMet 50-1000 mg daily bid She wanted to come off the VGo as it caused skin changes.  She is now on: - Lantus 24 >> 26 units at bedtime. - NovoLog 6 >> 10 units 15 min before every meal - 3x a day - Sliding scale of NovoLog - does not need to use it lately: - 150-175: + 1 unit  - 176-200: + 2 units  - 201-225: + 3 units  - >225: + 4 units  She was out of Janumet 50-1000 mg 2x a day - for 2 months 2/2 insurance coverage. We started Metformin at last visit >> she stopped.  Does not bring a log >> checks 2x a day: - am: 120-190 >> 140s >> 145-155 >> 80-110 >> 90-97 >> 140-160 >> 65-113 - 2h after b'fast: n/c >> highest 165 (pancakes) >> n/c - before lunch: 140-160 >> n/c - 2h after lunch: n/c >> 108-120 >> n/c - before dinner: 170-214 >> 160s >> n/c >> 100-103 >> 200s >> (waking up)  65-80s - bedtime: 140-150s >> n/c She has hypoglycemia awareness at 60. Lowest 65.   She was vegan but was advised by GI to switch to regular diet (diverticulosis on colonoscopy). She is now eating a regular diet.  - Last eye appt: 02/2015. Reportedly, no DR.  - No CKD: Lab Results  Component Value Date   BUN 16 02/26/2016   BUN 19 06/25/2015   Lab Results  Component Value Date   CREATININE 0.59 02/26/2016   CREATININE 0.75 06/25/2015  Not on ACEI/ARB.  Last ACR: Component     Latest Ref Rng 06/25/2015   Microalb, Ur     0.0 - 1.9 mg/dL 63.7 (H)  Creatinine,U      276.0  MICROALB/CREAT RATIO     0.0 - 30.0 mg/g 23.1  Previously: Component     Latest Ref Rng 04/15/2014  Microalb, Ur     0.0 - 1.9 mg/dL 18.1 (H)  Creatinine,U      230.0  MICROALB/CREAT RATIO     0.0 - 30.0 mg/g 7.9   - She has HL: Lab Results  Component Value Date   CHOL 257* 02/26/2016   HDL 50.10 02/26/2016   LDLCALC 187* 02/26/2016   LDLDIRECT 146.2 02/05/2013   TRIG 101.0 02/26/2016   CHOLHDL 5 02/26/2016  On Pravastatin >> will switch to Lipitor. - She does have foot pain, likely from back pain.   She also has a history of being bipolar, migraines, and asthma. Also has RA by Dr. Ouida Sills (rheum.).  Husband died in August 10, 2015 (hemorrhagic stroke).  Review of Systems Constitutional: no weight gain, no fatigue Eyes: no blurry vision, no xerophthalmia ENT: no sore throat, no nodules palpated in throat, no dysphagia/odynophagia, no hoarseness Cardiovascular: no CP/SOB/palpitations/leg swelling Respiratory: no cough/SOB Gastrointestinal: no N/V/D/C  Musculoskeletal: no muscle/+  joint pain Skin: no rashes Neurological: no tremors/numbness/tingling/dizziness  I reviewed pt's medications, allergies, PMH, social hx, family hx, and changes were documented in the history of present illness. Otherwise, unchanged from my initial visit note.  Objective:   Physical Exam BP 118/70 mmHg  Pulse 92  Temp(Src) 98 F (36.7 C) (Oral)  Ht 5\' 3"  (1.6 m)  Wt 206 lb (93.441 kg)  BMI 36.50 kg/m2 Body mass index is 36.5 kg/(m^2). Wt Readings from Last 3 Encounters:  05/02/16 206 lb (93.441 kg)  04/19/16 205 lb (92.987 kg)  03/31/16 204 lb 8 oz (92.761 kg)  Constitutional: overweight, in NAD Eyes: PERRLA, EOMI, no exophthalmos ENT: moist mucous membranes, no thyromegaly, no cervical lymphadenopathy Cardiovascular: RRR, No MRG Respiratory: CTA B Gastrointestinal: abdomen soft, NT, ND, BS+ Musculoskeletal: no  deformities, strength intact in all 4  Assessment:     1. DM2, without complications, uncontrolled, insulin-dependent  2. HL    Plan:     1. DM2 - Patient is again returning after a long absence. Approximately a month ago, she started a ketogenic diet and her sugars are drastically improved. She had stopped metformin from before, to limit the number of medicines she is taking. Her sugars are mostly in the 70 to 80s throughout the day. We'll decrease her insulin doses as she is determined to continue with her low carb diet. We did discuss about the many benefits of metformin and I strongly encouraged her to consider restarting the metformin in an attempt to decrease her insulin to give him more. - I advised her to:  Patient Instructions  Please decrease the insulins: - Lantus 26 >> 20 units - Novolog: 10 >> 5 units (for large meals or more carbs, 7 units)  If you plan to restart Metformin, try 500 mg 2x a day with meals (May need to reduce the insulins further in that).  Please return in 3 months with your sugar log.   - refused flu vaccine - She needs a new eye exam - Reviewed last HbA1c, which was 7.6% 2 months ago. We'll recheck this at next visit. - I will see her back in 3 months with her sugar log  2. HL - On Pravastatin now, but will switch to Lipitor, due to insurance preference - the new diet should help her decrease her LDL and triglycerides

## 2016-05-02 NOTE — Patient Instructions (Addendum)
Please decrease the insulins: - Lantus 26 >> 20 units - Novolog: 10 >> 5 units (for large meals or more carbs, 7 units)  If you plan to restart Metformin, try 500 mg 2x a day with meals (May need to reduce the insulins further in that).  Please return in 3 months with your sugar log.

## 2016-05-13 ENCOUNTER — Emergency Department (HOSPITAL_COMMUNITY)
Admission: EM | Admit: 2016-05-13 | Discharge: 2016-05-13 | Disposition: A | Discharge status: Home / Self Care | Attending: Emergency Medicine | Admitting: Emergency Medicine

## 2016-05-13 ENCOUNTER — Encounter (HOSPITAL_COMMUNITY): Payer: Self-pay | Admitting: Emergency Medicine

## 2016-05-13 DIAGNOSIS — Z794 Long term (current) use of insulin: Secondary | ICD-10-CM | POA: Diagnosis not present

## 2016-05-13 DIAGNOSIS — F329 Major depressive disorder, single episode, unspecified: Secondary | ICD-10-CM | POA: Insufficient documentation

## 2016-05-13 DIAGNOSIS — J45909 Unspecified asthma, uncomplicated: Secondary | ICD-10-CM | POA: Insufficient documentation

## 2016-05-13 DIAGNOSIS — I2510 Atherosclerotic heart disease of native coronary artery without angina pectoris: Secondary | ICD-10-CM | POA: Diagnosis not present

## 2016-05-13 DIAGNOSIS — Z853 Personal history of malignant neoplasm of breast: Secondary | ICD-10-CM | POA: Insufficient documentation

## 2016-05-13 DIAGNOSIS — Z79899 Other long term (current) drug therapy: Secondary | ICD-10-CM | POA: Diagnosis not present

## 2016-05-13 DIAGNOSIS — M5432 Sciatica, left side: Secondary | ICD-10-CM | POA: Diagnosis not present

## 2016-05-13 DIAGNOSIS — E119 Type 2 diabetes mellitus without complications: Secondary | ICD-10-CM | POA: Diagnosis not present

## 2016-05-13 DIAGNOSIS — M549 Dorsalgia, unspecified: Secondary | ICD-10-CM | POA: Diagnosis present

## 2016-05-13 DIAGNOSIS — I252 Old myocardial infarction: Secondary | ICD-10-CM | POA: Insufficient documentation

## 2016-05-13 LAB — URINALYSIS, ROUTINE W REFLEX MICROSCOPIC
BILIRUBIN URINE: NEGATIVE
GLUCOSE, UA: NEGATIVE mg/dL
Hgb urine dipstick: NEGATIVE
KETONES UR: NEGATIVE mg/dL
LEUKOCYTES UA: NEGATIVE
NITRITE: NEGATIVE
PROTEIN: 30 mg/dL — AB
Specific Gravity, Urine: 1.031 — ABNORMAL HIGH (ref 1.005–1.030)
pH: 5.5 (ref 5.0–8.0)

## 2016-05-13 LAB — RAPID URINE DRUG SCREEN, HOSP PERFORMED
Amphetamines: NOT DETECTED
BENZODIAZEPINES: NOT DETECTED
Barbiturates: NOT DETECTED
Cocaine: NOT DETECTED
Opiates: NOT DETECTED
Tetrahydrocannabinol: NOT DETECTED

## 2016-05-13 LAB — URINE MICROSCOPIC-ADD ON: RBC / HPF: NONE SEEN RBC/hpf (ref 0–5)

## 2016-05-13 MED ORDER — GABAPENTIN 300 MG PO CAPS: 300.0000 mg | ORAL_CAPSULE | Freq: Three times a day (TID) | ORAL | Status: DC

## 2016-05-13 MED ORDER — HYDROCODONE-ACETAMINOPHEN 5-325 MG PO TABS: 2.0000 | ORAL_TABLET | ORAL | Status: DC | PRN

## 2016-05-13 MED ORDER — IBUPROFEN 600 MG PO TABS: 600.0000 mg | ORAL_TABLET | Freq: Four times a day (QID) | ORAL | Status: DC | PRN

## 2016-05-13 NOTE — Discharge Instructions (Signed)
Radicular Pain °Radicular pain in either the arm or leg is usually from a bulging or herniated disk in the spine. A piece of the herniated disk may press against the nerves as the nerves exit the spine. This causes pain which is felt at the tips of the nerves down the arm or leg. Other causes of radicular pain may include: °· Fractures. °· Heart disease. °· Cancer. °· An abnormal and usually degenerative state of the nervous system or nerves (neuropathy). °Diagnosis may require CT or MRI scanning to determine the primary cause.  °Nerves that start at the neck (nerve roots) may cause radicular pain in the outer shoulder and arm. It can spread down to the thumb and fingers. The symptoms vary depending on which nerve root has been affected. In most cases radicular pain improves with conservative treatment. Neck problems may require physical therapy, a neck collar, or cervical traction. Treatment may take many weeks, and surgery may be considered if the symptoms do not improve.  °Conservative treatment is also recommended for sciatica. Sciatica causes pain to radiate from the lower back or buttock area down the leg into the foot. Often there is a history of back problems. Most patients with sciatica are better after 2 to 4 weeks of rest and other supportive care. Short term bed rest can reduce the disk pressure considerably. Sitting, however, is not a good position since this increases the pressure on the disk. You should avoid bending, lifting, and all other activities which make the problem worse. Traction can be used in severe cases. Surgery is usually reserved for patients who do not improve within the first months of treatment. °Only take over-the-counter or prescription medicines for pain, discomfort, or fever as directed by your caregiver. Narcotics and muscle relaxants may help by relieving more severe pain and spasm and by providing mild sedation. Cold or massage can give significant relief. Spinal manipulation  is not recommended. It can increase the degree of disc protrusion. Epidural steroid injections are often effective treatment for radicular pain. These injections deliver medicine to the spinal nerve in the space between the protective covering of the spinal cord and back bones (vertebrae). Your caregiver can give you more information about steroid injections. These injections are most effective when given within two weeks of the onset of pain.  °You should see your caregiver for follow up care as recommended. A program for neck and back injury rehabilitation with stretching and strengthening exercises is an important part of management.  °SEEK IMMEDIATE MEDICAL CARE IF: °· You develop increased pain, weakness, or numbness in your arm or leg. °· You develop difficulty with bladder or bowel control. °· You develop abdominal pain. °  °This information is not intended to replace advice given to you by your health care provider. Make sure you discuss any questions you have with your health care provider. °  °Document Released: 01/12/2005 Document Revised: 12/26/2014 Document Reviewed: 07/01/2015 °Elsevier Interactive Patient Education ©2016 [employer] ° °Sciatica °Sciatica is pain, weakness, numbness, or tingling along your sciatic nerve. The nerve starts in the lower back and runs down the back of each leg. Nerve damage or certain conditions pinch or put pressure on the sciatic nerve. This causes the pain, weakness, and other discomforts of sciatica. °HOME CARE  °· Only take medicine as told by your doctor. °· Apply ice to the affected area for 20 minutes. Do this 3-4 times a day for the first 48-72 hours. Then try heat in the same   way. °· Exercise, stretch, or do your usual activities if these do not make your pain worse. °· Go to physical therapy as told by your doctor. °· Keep all doctor visits as told. °· Do not wear high heels or shoes that are not supportive. °· Get a firm mattress if your mattress is too soft  to lessen pain and discomfort. °GET HELP RIGHT AWAY IF:  °· You cannot control when you poop (bowel movement) or pee (urinate). °· You have more weakness in your lower back, lower belly (pelvis), butt (buttocks), or legs. °· You have redness or puffiness (swelling) of your back. °· You have a burning feeling when you pee. °· You have pain that gets worse when you lie down. °· You have pain that wakes you from your sleep. °· Your pain is worse than past pain. °· Your pain lasts longer than 4 weeks. °· You are suddenly losing weight without reason. °MAKE SURE YOU:  °· Understand these instructions. °· Will watch this condition. °· Will get help right away if you are not doing well or get worse. °  °This information is not intended to replace advice given to you by your health care provider. Make sure you discuss any questions you have with your health care provider. °  °Document Released: 09/13/2008 Document Revised: 08/26/2015 Document Reviewed: 04/15/2012 °Elsevier Interactive Patient Education ©2016 [employer] ° °

## 2016-05-13 NOTE — ED Provider Notes (Signed)
CSN: [id number]     Arrival date & time 05/13/16  1259 History   First MD Initiated Contact with Patient 05/13/16 1400     Chief Complaint  Patient presents with  . Back Pain  . Leg Pain      HPI Pt reports pain from below shoulder blades to bilateral feet for the past year that has gradually gotten worse. Hx of herniated disc, but unsure which level. No known injuries or stressors. Past Medical History  Diagnosis Date  . Diabetes mellitus   . Bipolar disorder ([place])   . Asthma   . Coronary artery disease   . Arthritis   . Dyspnea 04/13/2011    pfts 03/2011:  Normal FEV1 and FEV1/FVC Ct chest 2012:  No PE, normal parenchyma [name] 04/18/2011 with active symptoms:  Normal FEV1%, mild decrease in FVC due to restriction vs airtrapping, normal appearing       flow volume loop.    . Depression   . Chicken pox   . Seasonal allergies   . Migraines   . 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 Allergy   . Myocardial infarction ([place])   . Diverticulosis   . Fatty liver   . Polyarthralgia - managed by Dr. Ouida Sills per her report 02/05/2013  . Arthritis, rheumatoid [facility])    Past Surgical History  Procedure Laterality Date  . Abdominal hysterectomy    . Tubal ligation    . Appendectomy    . Myomectomy    . Cesarean section    . Breast cyst excision    . Inner ear surgery      tubes in ears   Family History  Problem Relation Age of Onset  . Allergies Sister   . Allergies Daughter   . Allergies Daughter   . Asthma Daughter   . Asthma Daughter   . Arthritis Maternal Grandmother   . Hyperlipidemia Maternal Grandmother   . Diabetes Maternal Grandmother   . Hypertension Maternal Aunt   . Sudden death Mother   . Kidney failure Mother   . Sudden death Father   . Mental illness    . Colon cancer Neg Hx   . Rectal cancer Neg Hx   . Stomach cancer Neg Hx    Social History  Substance Use Topics  . Smoking status: Never Smoker   . Smokeless tobacco: Never Used  . Alcohol Use:  No   OB History    No data available     Review of Systems  All other systems reviewed and are negative.     Allergies  Amoxicillin  Home Medications   Prior to Admission medications   Medication Sig Start Date End Date Taking? Authorizing Provider  albuterol (PROVENTIL HFA;VENTOLIN HFA) 108 (90 BASE) MCG/ACT inhaler Inhale 2 puffs into the lungs every 6 (six) hours as needed for wheezing or shortness of breath. 11/17/14  Yes Philemon Kingdom, MD  busPIRone (BUSPAR) 10 MG tablet Take 10 mg by mouth 3 (three) times daily.   Yes Historical Provider, MD  CINNAMON PO Take 1 tablet by mouth daily.    Yes Historical Provider, MD  COCONUT OIL PO Take 1 tablet by mouth daily.    Yes Historical Provider, MD  insulin aspart (NOVOLOG FLEXPEN) 100 UNIT/ML FlexPen Inject 5 Units into the skin 3 (three) times daily with meals.   Yes Historical Provider, MD  Insulin Glargine (LANTUS SOLOSTAR) 100 UNIT/ML Solostar Pen INJECT 20 UNITS UNDER THE SKIN  DAILY AT 10 P.M. 05/02/16  Yes Philemon Kingdom, MD  lamoTRIgine (LAMICTAL) 100 MG tablet Take 1 tablet (100 mg total) by mouth daily. 11/17/14  Yes Philemon Kingdom, MD  sertraline (ZOLOFT) 100 MG tablet Take 1 tablet (100 mg total) by mouth daily. 11/17/14  Yes Philemon Kingdom, MD  TURMERIC PO Take 2 mLs by mouth daily.   Yes Historical Provider, MD  atorvastatin (LIPITOR) 40 MG tablet Take 1 tablet (40 mg total) by mouth daily. Patient not taking: Reported on 04/19/2016 04/14/16   Lucretia Kern, DO  busPIRone (BUSPAR) 15 MG tablet Take 1/2 tablet by mouth 2 times daily. Patient not taking: Reported on 05/13/2016 11/17/14   Philemon Kingdom, MD  FREESTYLE LITE test strip USE TO TEST BLOOD SUGAR TWICE A DAY AS INSTRUCTED 01/26/16   Philemon Kingdom, MD  gabapentin (NEURONTIN) 300 MG capsule Take 1 capsule (300 mg total) by mouth 3 (three) times daily. 05/13/16   Leonard Schwartz, MD  HYDROcodone-acetaminophen (NORCO/VICODIN) 5-325 MG tablet Take 2 tablets by mouth  every 4 (four) hours as needed. 05/13/16   Leonard Schwartz, MD  ibuprofen (ADVIL,MOTRIN) 600 MG tablet Take 1 tablet (600 mg total) by mouth every 6 (six) hours as needed. 05/13/16   Leonard Schwartz, MD  Insulin Pen Needle (CAREFINE PEN NEEDLES) 32G X 4 MM MISC Use to inject insulin 4 times daily. 10/29/15   Philemon Kingdom, MD  Lancets (FREESTYLE) lancets Use to test blood sugar 2 times daily as instructed. 10/29/15   Philemon Kingdom, MD   BP 115/81 mmHg  Pulse 80  Temp(Src) 98.4 F (36.9 C) (Oral)  Resp 16  SpO2 99% Physical Exam  Constitutional: She is oriented to person, place, and time. She appears well-developed and well-nourished. No distress.  HENT:  Head: Normocephalic and atraumatic.  Eyes: Pupils are equal, round, and reactive to light.  Neck: Normal range of motion.  Cardiovascular: Normal rate and intact distal pulses.   Pulmonary/Chest: No respiratory distress.  Abdominal: Normal appearance. She exhibits no distension.  Musculoskeletal:       Back:  Neurological: She is alert and oriented to person, place, and time. No cranial nerve deficit.  Skin: Skin is warm and dry. No rash noted.  Psychiatric: She has a normal mood and affect. Her behavior is normal.  Nursing note and vitals reviewed.   ED Course  Procedures (including critical care time) Medications - No data to display  Labs Review Labs Reviewed  URINALYSIS, ROUTINE W REFLEX MICROSCOPIC (NOT AT [facility]) - Abnormal; Notable for the following:    Specific Gravity, Urine 1.031 (*)    Protein, ur 30 (*)    All other components within normal limits  URINE MICROSCOPIC-ADD ON - Abnormal; Notable for the following:    Squamous Epithelial / LPF 0-5 (*)    Bacteria, UA RARE (*)    All other components within normal limits  URINE RAPID DRUG SCREEN, HOSP PERFORMED    Imaging Review No results found. I have personally reviewed and evaluated these images and lab results as part of my medical decision-making.    MDM    Final diagnoses:  Sciatica of left side        Leonard Schwartz, MD 05/15/16 1652

## 2016-05-13 NOTE — ED Notes (Signed)
Pt reports pain from below shoulder blades to bilateral feet for the past year that has gradually gotten worse. Hx of herniated disc, but unsure which level. No known injuries or stressors.

## 2016-06-08 ENCOUNTER — Ambulatory Visit (INDEPENDENT_AMBULATORY_CARE_PROVIDER_SITE_OTHER): Admitting: Neurology

## 2016-06-08 ENCOUNTER — Encounter: Payer: Self-pay | Admitting: Neurology

## 2016-06-08 VITALS — BP 125/79 | HR 76 | Ht 63.0 in | Wt 208.8 lb

## 2016-06-08 DIAGNOSIS — R531 Weakness: Secondary | ICD-10-CM | POA: Diagnosis not present

## 2016-06-08 DIAGNOSIS — R3915 Urgency of urination: Secondary | ICD-10-CM | POA: Diagnosis not present

## 2016-06-08 DIAGNOSIS — M5416 Radiculopathy, lumbar region: Secondary | ICD-10-CM

## 2016-06-08 DIAGNOSIS — R29898 Other symptoms and signs involving the musculoskeletal system: Secondary | ICD-10-CM

## 2016-06-08 MED ORDER — GABAPENTIN 300 MG PO CAPS: 300.0000 mg | ORAL_CAPSULE | Freq: Three times a day (TID) | ORAL | Status: DC

## 2016-06-08 MED ORDER — CYCLOBENZAPRINE HCL 10 MG PO TABS: 10.0000 mg | ORAL_TABLET | Freq: Three times a day (TID) | ORAL | Status: DC | PRN

## 2016-06-08 NOTE — Progress Notes (Signed)
[facility]    Provider:  Dr Jaynee Eagles Referring Provider: emergency room Primary Care Physician:  Lucretia Kern., DO  CC:  Pain in the legs  HPI:  Kerri Osborn is a 55 y.o. female here as a referral from The emergency room for pain in the legs. She has a past medical history of asthma, bipolar, uncontrolled diabetes, arthritis, hyperlipidemia, back pain and radiculopathy, joint pain, depression. Patient has a history of back pain complaints since at least 2015 and she has been evaluated by a spine specialist and rheumatologist. She has never had epidural steroid injections or pain procedures. She reports her symptoms persist and are worsening. MRIs in the past have been unremarkable. She feels 2 hot pokers in her back across the lower back and radiate down the legs. These are the same symptoms since 2015 but worsening and becoming more consistent. She can't sit. It hurts to sit, it hurts to stand, definitely starts with low back pain and radiation down the legs. The pain will shoot into the vaginal area from the back. Weakness in the legs. No falls. No change sin bowel or bladder except possibly urge to urinate increase, no incontinence. Back is better when she lays down. Worse in all other positions. Better with stretching out.She works at [employer], worse when on her feet. She has lower back spasms that are extremely painful. She has never tried muscle relaxers. She takes pain medication. No inciting events, no trauma or other events, she says it is just old age. No other focal neurologic deficits. Neurontin helped was given the ED.  Reviewed notes, labs and imaging from outside physicians, which showed:   Patient was seen in the emergency room in May. She reported pain from below the shoulder blades bilaterally to the feet for a year which has worsened. She reported back pain and leg pain. Gradual worsening. She was diagnosed with sciatica. She was referred here. She was  seen by her primary care physician in January 2017 for low back pain with radiation tingling sensation to feet bilaterally after throwing something in the garbage at work. At that time she was doing much better. She denied weakness, worsening, bowel or bladder incontinence.  She was also seen in the emergency room in early January complaining of moderate, burning, middle back pain with radiation into the left side of her back and down her legs to the feet. She heard a pop and she felt a tingling sensation in her feet. At that time she denied any previous similar experiences.She has history of similar symptoms in July 2016 following reported work related injury. She also has had recurrent reported lumbar back pain with radicular symptoms in the past with MRI and lumbar plain films in 2015 and was referred to a spine specialist and was told that the MRI did not explain her symptoms. In 2015 she was seen by Dr. Davis Gourd and diagnosed with polyarthralgia and low back pain and she was seen at spine specialist and a rheumatologist and had an MRI and CT in 2015. She has also complaints including knee pain in the past, wrist pain in the past, plantar fasciitis, and other multiple pain complaints in muscles or joints.  MRI of the lumbar spine was completed in July 2015: Personally reviewed images and agree with the following  COMPARISON: None.  FINDINGS: The vertebral bodies of the lumbar spine are normal in size. The vertebral bodies of the lumbar spine are normal in alignment. There is normal bone  marrow signal demonstrated throughout the vertebra. The intervertebral disc spaces are well-maintained.  The spinal cord is normal in signal and contour. The cord terminates normally at L1 . The nerve roots of the cauda equina and the filum terminale are normal.  The visualized portions of the SI joints are unremarkable.  The imaged intra-abdominal contents are unremarkable.  T12-L1: No significant disc  bulge. No evidence of neural foraminal stenosis. No central canal stenosis.  L1-L2: Mild broad-based disc bulge. No evidence of neural foraminal stenosis. No central canal stenosis.  L2-L3: No significant disc bulge. No evidence of neural foraminal stenosis. No central canal stenosis.  L3-L4: No significant disc bulge. No evidence of neural foraminal stenosis. No central canal stenosis.  L4-L5: No significant disc bulge. No evidence of neural foraminal stenosis. No central canal stenosis.  L5-S1: No significant disc bulge. There are prominent flow voids surrounding the right intraspinal L5 nerve root likely reflecting vessels. No evidence of neural foraminal stenosis. No central canal stenosis.  IMPRESSION: 1. Mild broad-based disc bulge at L1-2. 2. No significant lumbar spine disc protrusion, foraminal stenosis or central canal stenosis. 3. At L5-S1 there are prominent vessels surrounding the right intraspinal L5 nerve root.   Review of Systems: Patient complains of symptoms per HPI as well as the following symptoms: Snoring, joint pain, urination problems, numbness, weakness, depression, anxiety. Pertinent negatives per HPI. All others negative.   Social History   Social History  . Marital Status: Widowed    Spouse Name: N/A  . Number of Children: 2  . Years of Education: 16   Occupational History  . [employer]     Social History Main Topics  . Smoking status: Never Smoker   . Smokeless tobacco: Never Used  . Alcohol Use: No  . Drug Use: No  . Sexual Activity: Not on file   Other Topics Concern  . 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      Spiritual Beliefs:       Lifestyle: MWF does kickboxing, vegetarian - trying to go vegan      Caffeine use: Drinks tea rarely                    Family History  Problem Relation Age of Onset  .  Allergies Sister   . Allergies Daughter   . Allergies Daughter   . Asthma Daughter   . Asthma Daughter   . Arthritis Maternal Grandmother   . Hyperlipidemia Maternal Grandmother   . Diabetes Maternal Grandmother   . Hypertension Maternal Aunt   . Sudden death Mother   . Kidney failure Mother   . Sudden death Father   . Mental illness    . Colon cancer Neg Hx   . Rectal cancer Neg Hx   . Stomach cancer Neg Hx   . Neuropathy Neg Hx     Past Medical History  Diagnosis Date  . Diabetes mellitus   . Bipolar disorder ([place])   . Asthma   . Coronary artery disease   . Arthritis   . Dyspnea 04/13/2011    pfts 03/2011:  Normal FEV1 and FEV1/FVC Ct chest 2012:  No PE, normal parenchyma [name] 04/18/2011 with active symptoms:  Normal FEV1%, mild decrease in FVC due to restriction vs airtrapping, normal appearing       flow volume loop.    [name] Kitchen  Depression   . Chicken pox   . Seasonal allergies   . Migraines   . 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 Allergy   . Myocardial infarction ([place])   . Diverticulosis   . Fatty liver   . Polyarthralgia - managed by Dr. Ouida Sills per her report 02/05/2013  . Arthritis, rheumatoid [facility])     Past Surgical History  Procedure Laterality Date  . Abdominal hysterectomy    . Tubal ligation    . Appendectomy    . Myomectomy    . Cesarean section    . Breast cyst excision    . Inner ear surgery      tubes in ears    Current Outpatient Prescriptions  Medication Sig Dispense Refill  . albuterol (PROVENTIL HFA;VENTOLIN HFA) 108 (90 BASE) MCG/ACT inhaler Inhale 2 puffs into the lungs every 6 (six) hours as needed for wheezing or shortness of breath. 18 g 5  . busPIRone (BUSPAR) 15 MG tablet Take 1/2 tablet by mouth 2 times daily. 90 tablet 1  . CINNAMON PO Take 1 tablet by mouth daily.     . COCONUT OIL PO Take 1 tablet by mouth daily.     [name] Kitchen FREESTYLE LITE test strip USE TO TEST BLOOD SUGAR TWICE A DAY AS INSTRUCTED 200 each 3  . insulin aspart  (NOVOLOG FLEXPEN) 100 UNIT/ML FlexPen Inject 5 Units into the skin 3 (three) times daily with meals.    . Insulin Glargine (LANTUS SOLOSTAR) 100 UNIT/ML Solostar Pen INJECT 20 UNITS UNDER THE SKIN DAILY AT 10 P.M. 30 mL 0  . Insulin Pen Needle (CAREFINE PEN NEEDLES) 32G X 4 MM MISC Use to inject insulin 4 times daily. 360 each 0  . lamoTRIgine (LAMICTAL) 100 MG tablet Take 1 tablet (100 mg total) by mouth daily. 90 tablet 1  . Lancets (FREESTYLE) lancets Use to test blood sugar 2 times daily as instructed. 200 each 0  . sertraline (ZOLOFT) 100 MG tablet Take 1 tablet (100 mg total) by mouth daily. 90 tablet 1  . TURMERIC PO Take 2 mLs by mouth daily.    . cyclobenzaprine (FLEXERIL) 10 MG tablet Take 1 tablet (10 mg total) by mouth 3 (three) times daily as needed for muscle spasms. 90 tablet 3  . gabapentin (NEURONTIN) 300 MG capsule Take 1 capsule (300 mg total) by mouth 3 (three) times daily. 90 capsule 11   No current facility-administered medications for this visit.    Allergies as of 06/08/2016 - Review Complete 06/08/2016  Allergen Reaction Noted  . Amoxicillin Itching and Rash 04/13/2011    Vitals: BP 125/79 mmHg  Pulse 76  Ht 5\' 3"  (1.6 m)  Wt 208 lb 12.8 oz (94.711 kg)  BMI 37.00 kg/m2 Last Weight:  Wt Readings from Last 1 Encounters:  06/08/16 208 lb 12.8 oz (94.711 kg)   Last Height:   Ht Readings from Last 1 Encounters:  06/08/16 5\' 3"  (1.6 m)    Physical exam: Exam: Gen: NAD, conversant, well nourised, obese, well groomed                     CV: RRR, no MRG. No Carotid Bruits. No peripheral edema, warm, nontender Eyes: Conjunctivae clear without exudates or hemorrhage  Neuro: Detailed Neurologic Exam  Speech:    Speech is normal; fluent and spontaneous with normal comprehension.  Cognition:    The patient is oriented to person, place, and time;  recent and remote memory intact;     language fluent;     normal attention, concentration,     fund of  knowledge Cranial Nerves:    The pupils are equal, round, and reactive to light. The fundi are normal and spontaneous venous pulsations are present. Visual fields are full to finger confrontation. Extraocular movements are intact. Trigeminal sensation is intact and the muscles of mastication are normal. The face is symmetric. The palate elevates in the midline. Hearing intact. Voice is normal. Shoulder shrug is normal. The tongue has normal motion without fasciculations.   Coordination:    Normal finger to nose and heel to shin. Normal rapid alternating movements.   Gait:    Antalgic due to back pain  Motor Observation:    No asymmetry, no atrophy, and no involuntary movements noted. Tone:    Normal muscle tone.    Posture:    Posture is normal. normal erect    Strength: Left hip flexion 4/5 and leg flexion 4+/5. Otherwise strength is V/V in the upper and lower limbs.      Sensation: intact to LT     Reflex Exam:  DTR's:    Deep tendon reflexes in the upper and lower extremities are brisk bilaterally.   Toes:    The toes are downgoing bilaterally.   Clonus:    Clonus is absent.      Assessment/Plan:  55 year old with chronic low back pain with muscle spasms and radiation to the toes. MRi in the past was unremarkable but worsening, becoming more frequent and continuous will repeat MRi of the lumbar spine given worsening symptoms and left leg weakness on exam.   We'll continue Neurontin 300 mg 3 times a day when necessary Flexeril daily at bedtime he can also take during the day when necessary as needed MRI of the lumbar spine. Needs pain management with Dr. Nelva Bush, he may be able to provide epidural steroid injections Reviewed side effects of medications as documented in the AVS    Sarina Ill, MD  [facility] [address] [place], [place] [address]  Phone [phone] Fax [phone]

## 2016-06-08 NOTE — Patient Instructions (Addendum)
Remember to drink plenty of fluid, eat healthy meals and do not skip any meals. Try to eat protein with a every meal and eat a healthy snack such as fruit or nuts in between meals. Try to keep a regular sleep-wake schedule and try to exercise daily, particularly in the form of walking, 20-30 minutes a day, if you can.   As far as your medications are concerned, I would like to suggest Flexeril at night before bed. May also take it during the day but watch for sedation Neurontin 3x a day as needed, watch for sedation  As far as diagnostic testing: MRI lumbar spine, physical therapy, Dr. Nelva Bush for evaluation of pain procedures such as epidural steroid injections  Our phone number is [phone]. We also have an after hours call service for urgent matters and there is a physician on-call for urgent questions. For any emergencies you know to call 911 or go to the nearest emergency room  Gabapentin capsules or tablets What is this medicine? GABAPENTIN (GA ba pen tin) is used to control partial seizures in adults with epilepsy. It is also used to treat certain types of nerve pain. This medicine may be used for other purposes; ask your health care provider or pharmacist if you have questions. What should I tell my health care provider before I take this medicine? They need to know if you have any of these conditions: -kidney disease -suicidal thoughts, plans, or attempt; a previous suicide attempt by you or a family member -an unusual or allergic reaction to gabapentin, other medicines, foods, dyes, or preservatives -pregnant or trying to get pregnant -breast-feeding How should I use this medicine? Take this medicine by mouth with a glass of water. Follow the directions on the prescription label. You can take it with or without food. If it upsets your stomach, take it with food.Take your medicine at regular intervals. Do not take it more often than directed. Do not stop taking except on your doctor's  advice. If you are directed to break the 600 or 800 mg tablets in half as part of your dose, the extra half tablet should be used for the next dose. If you have not used the extra half tablet within 28 days, it should be thrown away. A special MedGuide will be given to you by the pharmacist with each prescription and refill. Be sure to read this information carefully each time. Talk to your pediatrician regarding the use of this medicine in children. Special care may be needed. Overdosage: If you think you have taken too much of this medicine contact a poison control center or emergency room at once. NOTE: This medicine is only for you. Do not share this medicine with others. What if I miss a dose? If you miss a dose, take it as soon as you can. If it is almost time for your next dose, take only that dose. Do not take double or extra doses. What may interact with this medicine? Do not take this medicine with any of the following medications: -other gabapentin products This medicine may also interact with the following medications: -alcohol -antacids -antihistamines for allergy, cough and cold -certain medicines for anxiety or sleep -certain medicines for depression or psychotic disturbances -homatropine; hydrocodone -naproxen -narcotic medicines (opiates) for pain -phenothiazines like chlorpromazine, mesoridazine, prochlorperazine, thioridazine This list may not describe all possible interactions. Give your health care provider a list of all the medicines, herbs, non-prescription drugs, or dietary supplements you use. Also tell them if  you smoke, drink alcohol, or use illegal drugs. Some items may interact with your medicine. What should I watch for while using this medicine? Visit your doctor or health care professional for regular checks on your progress. You may want to keep a record at home of how you feel your condition is responding to treatment. You may want to share this information  with your doctor or health care professional at each visit. You should contact your doctor or health care professional if your seizures get worse or if you have any new types of seizures. Do not stop taking this medicine or any of your seizure medicines unless instructed by your doctor or health care professional. Stopping your medicine suddenly can increase your seizures or their severity. Wear a medical identification bracelet or chain if you are taking this medicine for seizures, and carry a card that lists all your medications. You may get drowsy, dizzy, or have blurred vision. Do not drive, use machinery, or do anything that needs mental alertness until you know how this medicine affects you. To reduce dizzy or fainting spells, do not sit or stand up quickly, especially if you are an older patient. Alcohol can increase drowsiness and dizziness. Avoid alcoholic drinks. Your mouth may get dry. Chewing sugarless gum or sucking hard candy, and drinking plenty of water will help. The use of this medicine may increase the chance of suicidal thoughts or actions. Pay special attention to how you are responding while on this medicine. Any worsening of mood, or thoughts of suicide or dying should be reported to your health care professional right away. Women who become pregnant while using this medicine may enroll in the [place] Pregnancy Registry by calling [phone]. This registry collects information about the safety of antiepileptic drug use during pregnancy. What side effects may I notice from receiving this medicine? Side effects that you should report to your doctor or health care professional as soon as possible: -allergic reactions like skin rash, itching or hives, swelling of the face, lips, or tongue -worsening of mood, thoughts or actions of suicide or dying Side effects that usually do not require medical attention (report to your doctor or health care professional if  they continue or are bothersome): -constipation -difficulty walking or controlling muscle movements -dizziness -nausea -slurred speech -tiredness -tremors -weight gain This list may not describe all possible side effects. Call your doctor for medical advice about side effects. You may report side effects to FDA at 1-800-FDA-1088. Where should I keep my medicine? Keep out of reach of children. This medicine may cause accidental overdose and death if it taken by other adults, children, or pets. Mix any unused medicine with a substance like cat litter or coffee grounds. Then throw the medicine away in a sealed container like a sealed bag or a coffee can with a lid. Do not use the medicine after the expiration date. Store at room temperature between 15 and 30 degrees C (59 and 86 degrees F). NOTE: This sheet is a summary. It may not cover all possible information. If you have questions about this medicine, talk to your doctor, pharmacist, or health care provider.    2016, Elsevier/Gold Standard. (2014-01-31 15:26:50)   Cyclobenzaprine tablets What is this medicine? CYCLOBENZAPRINE (sye kloe BEN za preen) is a muscle relaxer. It is used to treat muscle pain, spasms, and stiffness. This medicine may be used for other purposes; ask your health care provider or pharmacist if you have questions. What should  I tell my health care provider before I take this medicine? They need to know if you have any of these conditions: -heart disease, irregular heartbeat, or previous heart attack -liver disease -thyroid problem -an unusual or allergic reaction to cyclobenzaprine, tricyclic antidepressants, lactose, other medicines, foods, dyes, or preservatives -pregnant or trying to get pregnant -breast-feeding How should I use this medicine? Take this medicine by mouth with a glass of water. Follow the directions on the prescription label. If this medicine upsets your stomach, take it with food or milk.  Take your medicine at regular intervals. Do not take it more often than directed. Talk to your pediatrician regarding the use of this medicine in children. Special care may be needed. Overdosage: If you think you have taken too much of this medicine contact a poison control center or emergency room at once. NOTE: This medicine is only for you. Do not share this medicine with others. What if I miss a dose? If you miss a dose, take it as soon as you can. If it is almost time for your next dose, take only that dose. Do not take double or extra doses. What may interact with this medicine? Do not take this medicine with any of the following medications: -certain medicines for fungal infections like fluconazole, itraconazole, ketoconazole, posaconazole, voriconazole -cisapride -dofetilide -dronedarone -droperidol -flecainide -grepafloxacin -halofantrine -levomethadyl -MAOIs like Carbex, Eldepryl, Marplan, Nardil, and Parnate -nilotinib -pimozide -probucol -sertindole -thioridazine -ziprasidone This medicine may also interact with the following medications: -abarelix -alcohol -certain medicines for cancer -certain medicines for depression, anxiety, or psychotic disturbances -certain medicines for infection like alfuzosin, chloroquine, clarithromycin, levofloxacin, mefloquine, pentamidine, troleandomycin -certain medicines for an irregular heart beat -certain medicines used for sleep or numbness during surgery or procedure -contrast dyes -dolasetron -guanethidine -methadone -octreotide -ondansetron -other medicines that prolong the QT interval (cause an abnormal heart rhythm) -palonosetron -phenothiazines like chlorpromazine, mesoridazine, prochlorperazine, thioridazine -tramadol -vardenafil This list may not describe all possible interactions. Give your health care provider a list of all the medicines, herbs, non-prescription drugs, or dietary supplements you use. Also tell them  if you smoke, drink alcohol, or use illegal drugs. Some items may interact with your medicine. What should I watch for while using this medicine? Check with your doctor or health care professional if your condition does not improve within 1 to 3 weeks. You may get drowsy or dizzy when you first start taking the medicine or change doses. Do not drive, use machinery, or do anything that may be dangerous until you know how the medicine affects you. Stand or sit up slowly. Your mouth may get dry. Drinking water, chewing sugarless gum, or sucking on hard candy may help. What side effects may I notice from receiving this medicine? Side effects that you should report to your doctor or health care professional as soon as possible: -allergic reactions like skin rash, itching or hives, swelling of the face, lips, or tongue -chest pain -fast heartbeat -hallucinations -seizures -vomiting Side effects that usually do not require medical attention (report to your doctor or health care professional if they continue or are bothersome): -headache This list may not describe all possible side effects. Call your doctor for medical advice about side effects. You may report side effects to FDA at 1-800-FDA-1088. Where should I keep my medicine? Keep out of the reach of children. Store at room temperature between 15 and 30 degrees C (59 and 86 degrees F). Keep container tightly closed. Throw away any unused medicine after  the expiration date. NOTE: This sheet is a summary. It may not cover all possible information. If you have questions about this medicine, talk to your doctor, pharmacist, or health care provider.    2016, Elsevier/Gold Standard. (2013-07-02 12:48:19)

## 2016-06-09 ENCOUNTER — Encounter: Payer: Self-pay | Admitting: Neurology

## 2016-06-17 ENCOUNTER — Ambulatory Visit
Admission: RE | Admit: 2016-06-17 | Discharge: 2016-06-17 | Disposition: A | Discharge status: Home / Self Care | Source: Ambulatory Visit | Attending: Neurology | Admitting: Neurology

## 2016-06-17 DIAGNOSIS — M5416 Radiculopathy, lumbar region: Secondary | ICD-10-CM | POA: Diagnosis not present

## 2016-06-17 DIAGNOSIS — R29898 Other symptoms and signs involving the musculoskeletal system: Secondary | ICD-10-CM

## 2016-06-17 DIAGNOSIS — R3915 Urgency of urination: Secondary | ICD-10-CM

## 2016-06-17 DIAGNOSIS — R531 Weakness: Secondary | ICD-10-CM

## 2016-06-20 ENCOUNTER — Telehealth: Payer: Self-pay | Admitting: *Deleted

## 2016-06-20 NOTE — Telephone Encounter (Signed)
-----   Message from Melvenia Beam, MD sent at 06/17/2016  3:29 PM EDT ----- MRI of the lumbar spine has not changed from 06/2014 and is essentially normal. thanks

## 2016-06-20 NOTE — Telephone Encounter (Signed)
Tried calling home number listed. Automated message stated subscriber does not take incoming calls.  Called mobile. Spoke to pt about MRI results per Dr Jaynee Eagles note. She verbalized understanding. She states she got appt middle of August with Dr Nelva Bush. She was put on cx list to try and get appt sooner. She has no further questions at this time.

## 2016-07-31 NOTE — Progress Notes (Addendum)
HPI:   Kerri Osborn is a pleasant 55 yo F with PMH sig for DM with neuropathy (seeing endocrinology), bipolar disorder, CAD, obesity, migraines, polyarthralgias (sees Rheumatologist), poor compliance and chronic back and leg pain (seeing neurology and PMR) here for a follow up visit.   DM type 2, uncontrolled, with neuropathy ([place]) -managed by endo -meds: insulin, she had stopped her metformin on her own -doing ketogenic diet -denies vision changes, wounds, hypglycemia -seeing endo tomorrow  Hyperlipemia -meds: poor compliance with recommended tx in the past -advised to replace coconut oil with good fats and take statin last visit - she reports she did cut out coconut oil and is eating more veggies -she did not start the statin -wants to come back in a few hours to check lipids as is fasting only 6 hours now  Polyarthralgia: -sees specialist -meds: gabapentin, percocet, lamital, hydroxychloroquine in the past  Bipolar disorder, in partial remission, most recent episode mixed ([place]) -Managed by psychiatrist - [facility] -meds: lamictal, buspar, and zoloft  Chronic low back pain and leg pain: -saw Dr. Leonarda Salon in the past, saw Dr. Jaynee Eagles in 2017 with repeat MRI, referred to Dr. Nelva Bush 2017  ROS: See pertinent positives and negatives per HPI.  Past Medical History:  Diagnosis Date  . Allergy   . Arthritis   . Arthritis, rheumatoid ([place])   . Asthma   . Bipolar disorder ([place])   . Chicken pox   . Coronary artery disease   . Depression   . Diabetes mellitus   . Diverticulosis   . Dyspnea 04/13/2011   pfts 03/2011:  Normal FEV1 and FEV1/FVC Ct chest 2012:  No PE, normal parenchyma [name] 04/18/2011 with active symptoms:  Normal FEV1%, mild decrease in FVC due to restriction vs airtrapping, normal appearing       flow volume loop.    . Fatty liver   . Migraines   . Myocardial infarction ([place])   . Polyarthralgia - managed by Dr. Ouida Sills per her report 02/05/2013  . Positive  TB test    per patient reaction   . Seasonal allergies     Past Surgical History:  Procedure Laterality Date  . ABDOMINAL HYSTERECTOMY    . APPENDECTOMY    . BREAST CYST EXCISION    . CESAREAN SECTION    . INNER EAR SURGERY     tubes in ears  . MYOMECTOMY    . TUBAL LIGATION      Family History  Problem Relation Age of Onset  . Allergies Sister   . Allergies Daughter   . Allergies Daughter   . Asthma Daughter   . Asthma Daughter   . Arthritis Maternal Grandmother   . Hyperlipidemia Maternal Grandmother   . Diabetes Maternal Grandmother   . Hypertension Maternal Aunt   . Sudden death Mother   . Kidney failure Mother   . Sudden death Father   . Mental illness    . Colon cancer Neg Hx   . Rectal cancer Neg Hx   . Stomach cancer Neg Hx   . Neuropathy Neg Hx     Social History   Social History  . Marital status: Widowed    Spouse name: N/A  . Number of children: 2  . Years of education: 16   Occupational History  . [employer]     Social History Main Topics  . Smoking status: Never Smoker  . Smokeless tobacco: Never Used  . Alcohol use No  . Drug use: No  .  Sexual activity: Not Asked   Other Topics Concern  . 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      Spiritual Beliefs:       Lifestyle: MWF does kickboxing, vegetarian - trying to go vegan      Caffeine use: Drinks tea rarely                     Current Outpatient Prescriptions:  .  albuterol (PROVENTIL HFA;VENTOLIN HFA) 108 (90 BASE) MCG/ACT inhaler, Inhale 2 puffs into the lungs every 6 (six) hours as needed for wheezing or shortness of breath., Disp: 18 g, Rfl: 5 .  busPIRone (BUSPAR) 15 MG tablet, Take 1/2 tablet by mouth 2 times daily., Disp: 90 tablet, Rfl: 1 .  CINNAMON PO, Take 1 tablet by mouth daily. , Disp: , Rfl:  .  COCONUT OIL PO, Take 1 tablet by mouth daily. , Disp: , Rfl:  .   cyclobenzaprine (FLEXERIL) 10 MG tablet, Take 1 tablet (10 mg total) by mouth 3 (three) times daily as needed for muscle spasms., Disp: 90 tablet, Rfl: 3 .  FREESTYLE LITE test strip, USE TO TEST BLOOD SUGAR TWICE A DAY AS INSTRUCTED, Disp: 200 each, Rfl: 3 .  gabapentin (NEURONTIN) 300 MG capsule, Take 1 capsule (300 mg total) by mouth 3 (three) times daily., Disp: 90 capsule, Rfl: 11 .  insulin aspart (NOVOLOG FLEXPEN) 100 UNIT/ML FlexPen, Inject 5 Units into the skin 3 (three) times daily with meals., Disp: , Rfl:  .  Insulin Glargine (LANTUS SOLOSTAR) 100 UNIT/ML Solostar Pen, INJECT 20 UNITS UNDER THE SKIN DAILY AT 10 P.M., Disp: 30 mL, Rfl: 0 .  Insulin Pen Needle (CAREFINE PEN NEEDLES) 32G X 4 MM MISC, Use to inject insulin 4 times daily., Disp: 360 each, Rfl: 0 .  lamoTRIgine (LAMICTAL) 100 MG tablet, Take 1 tablet (100 mg total) by mouth daily., Disp: 90 tablet, Rfl: 1 .  Lancets (FREESTYLE) lancets, Use to test blood sugar 2 times daily as instructed., Disp: 200 each, Rfl: 0 .  sertraline (ZOLOFT) 100 MG tablet, Take 1 tablet (100 mg total) by mouth daily., Disp: 90 tablet, Rfl: 1 .  TURMERIC PO, Take 2 mLs by mouth daily., Disp: , Rfl:   EXAM:  Vitals:   08/01/16 0741  BP: 122/70  Pulse: 86  Temp: 98.3 F (36.8 C)    Body mass index is 37.62 kg/m.  GENERAL: vitals reviewed and listed above, alert, oriented, appears well hydrated and in no acute distress  HEENT: atraumatic, conjunttiva clear, no obvious abnormalities on inspection of external nose and ears  NECK: no obvious masses on inspection  LUNGS: clear to auscultation bilaterally, no wheezes, rales or rhonchi, good air movement  CV: HRRR, no peripheral edema  MS: moves all extremities without noticeable abnormality  PSYCH: pleasant and cooperative, no obvious depression or anxiety  ASSESSMENT AND PLAN:  Discussed the following assessment and plan:  Type 2 diabetes mellitus with hyperglycemia, with long-term  current use of insulin (H[facility]) - Plan: Hemoglobin [id number], Basic metabolic panel, Microalbumin/Creatinine Ratio, Urine  Obesity  Hyperlipidemia  -labs -lifestyle recs -will advise again to start statin if LDL not at goal -advise consideration of restarting metformin and backing off on insulin - she plans to discuss with endo tomorrow -Patient advised to return or notify a doctor immediately if symptoms worsen or  persist or new concerns arise.  Patient Instructions  BEFORE YOU LEAVE: -follow up: 3-4 months - come fasting as we will need to check your cholesterol -labs  Talk with your endocrinologist about getting back on metformin and decreasing insulin.  We have ordered labs or studies at this visit. It can take up to 1-2 weeks for results and processing. IF results require follow up or explanation, we will call you with instructions. Clinically stable results will be released to your [facility]. If you have not heard from [place] or cannot find your results in [facility] in 2 weeks please contact our office at [phone].  If you are not yet signed up for [facility], please consider signing up.         We recommend the following healthy lifestyle: 1) Small portions - eat off of salad plate instead of dinner plate 2) Eat a healthy clean diet with avoidance of (less then 1 serving per week) processed foods, sweetened drinks, white starches, red meat, fast foods and sweets and consisting of: * 5-9 servings per day of fresh or frozen fruits and vegetables (not corn or potatoes, not dried or canned) *nuts and seeds, beans *olives and olive oil *small portions of lean meats such as fish and white chicken  *small portions of whole grains 3)Get at least 150 minutes of sweaty aerobic exercise per week 4)reduce stress - counseling, meditation, relaxation to balance other aspects of your life     Colin Benton R., DO

## 2016-08-01 ENCOUNTER — Ambulatory Visit (INDEPENDENT_AMBULATORY_CARE_PROVIDER_SITE_OTHER): Admitting: Family Medicine

## 2016-08-01 ENCOUNTER — Encounter: Payer: Self-pay | Admitting: Family Medicine

## 2016-08-01 VITALS — BP 122/70 | HR 86 | Temp 98.3°F | Ht 63.0 in | Wt 212.4 lb

## 2016-08-01 DIAGNOSIS — E669 Obesity, unspecified: Secondary | ICD-10-CM | POA: Diagnosis not present

## 2016-08-01 DIAGNOSIS — E785 Hyperlipidemia, unspecified: Secondary | ICD-10-CM | POA: Diagnosis not present

## 2016-08-01 DIAGNOSIS — E1165 Type 2 diabetes mellitus with hyperglycemia: Secondary | ICD-10-CM

## 2016-08-01 DIAGNOSIS — Z794 Long term (current) use of insulin: Secondary | ICD-10-CM

## 2016-08-01 LAB — LIPID PANEL
CHOLESTEROL: 253 mg/dL — AB (ref 0–200)
HDL: 58.6 mg/dL (ref >39.00)
LDL CALC: 175 mg/dL — AB (ref 0–99)
NonHDL: 194
TRIGLYCERIDES: 94 mg/dL (ref 0.0–149.0)
Total CHOL/HDL Ratio: 4
VLDL: 18.8 mg/dL (ref 0.0–40.0)

## 2016-08-01 LAB — BASIC METABOLIC PANEL
BUN: 16 mg/dL (ref 6–23)
CHLORIDE: 103 meq/L (ref 96–112)
CO2: 31 mEq/L (ref 19–32)
CREATININE: 0.62 mg/dL (ref 0.40–1.20)
Calcium: 9.5 mg/dL (ref 8.4–10.5)
GFR: 129.59 mL/min (ref >60.00)
Glucose, Bld: 112 mg/dL — ABNORMAL HIGH (ref 70–99)
POTASSIUM: 3.6 meq/L (ref 3.5–5.1)
Sodium: 142 mEq/L (ref 135–145)

## 2016-08-01 LAB — HEMOGLOBIN A1C: HEMOGLOBIN A1C: 8.2 % — AB (ref 4.6–6.5)

## 2016-08-01 NOTE — Addendum Note (Signed)
Addended by: Lucretia Kern on: 08/01/2016 08:21 AM   Modules accepted: Orders

## 2016-08-01 NOTE — Patient Instructions (Addendum)
BEFORE YOU LEAVE: -follow up: 3-4 months  -come back in a few hours after fasting for 8 hours to check the labs  Talk with your endocrinologist about getting back on metformin and decreasing insulin.  We have ordered labs or studies at this visit. It can take up to 1-2 weeks for results and processing. IF results require follow up or explanation, we will call you with instructions. Clinically stable results will be released to your [facility]. If you have not heard from [place] or cannot find your results in [facility] in 2 weeks please contact our office at [phone].  If you are not yet signed up for [facility], please consider signing up.         We recommend the following healthy lifestyle: 1) Small portions - eat off of salad plate instead of dinner plate 2) Eat a healthy clean diet with avoidance of (less then 1 serving per week) processed foods, sweetened drinks, white starches, red meat, fast foods and sweets and consisting of: * 5-9 servings per day of fresh or frozen fruits and vegetables (not corn or potatoes, not dried or canned) *nuts and seeds, beans *olives and olive oil *small portions of lean meats such as fish and white chicken  *small portions of whole grains 3)Get at least 150 minutes of sweaty aerobic exercise per week 4)reduce stress - counseling, meditation, relaxation to balance other aspects of your life

## 2016-08-01 NOTE — Progress Notes (Signed)
Pre visit review using our clinic review tool, if applicable. No additional management support is needed unless otherwise documented below in the visit note. 

## 2016-08-02 ENCOUNTER — Ambulatory Visit (INDEPENDENT_AMBULATORY_CARE_PROVIDER_SITE_OTHER): Admitting: Internal Medicine

## 2016-08-02 ENCOUNTER — Encounter: Payer: Self-pay | Admitting: Internal Medicine

## 2016-08-02 VITALS — BP 130/80 | HR 79 | Ht 64.0 in | Wt 211.0 lb

## 2016-08-02 DIAGNOSIS — Z794 Long term (current) use of insulin: Secondary | ICD-10-CM | POA: Diagnosis not present

## 2016-08-02 DIAGNOSIS — E785 Hyperlipidemia, unspecified: Secondary | ICD-10-CM | POA: Diagnosis not present

## 2016-08-02 DIAGNOSIS — E1165 Type 2 diabetes mellitus with hyperglycemia: Secondary | ICD-10-CM | POA: Diagnosis not present

## 2016-08-02 LAB — MICROALBUMIN / CREATININE URINE RATIO
Creatinine,U: 174.4 mg/dL
MICROALB/CREAT RATIO: 11.1 mg/g (ref 0.0–30.0)
Microalb, Ur: 19.3 mg/dL — ABNORMAL HIGH (ref 0.0–1.9)

## 2016-08-02 MED ORDER — ATORVASTATIN CALCIUM 20 MG PO TABS: 20.0000 mg | ORAL_TABLET | Freq: Every day | ORAL | 1 refills | Status: DC

## 2016-08-02 MED ORDER — METFORMIN HCL 500 MG PO TABS: 500.0000 mg | ORAL_TABLET | Freq: Two times a day (BID) | ORAL | 3 refills | Status: DC

## 2016-08-02 NOTE — Addendum Note (Signed)
Addended by: Agnes Lawrence on: 08/02/2016 02:21 PM   Modules accepted: Orders

## 2016-08-02 NOTE — Progress Notes (Signed)
Subjective:     Patient ID: Kerri Osborn, female   DOB: [birth year], 55 y.o.   MRN: [id number]  HPI Kerri Osborn is a 55 y.o. woman, returning for f/u for DM2, dx. [id number], without complications, uncontrolled, insulin-dependent since 2012. Last visit 3 mo ago.  She started a Ketogenic diet before last visit >> sugars lower >> we decreased her insulin doses >> last HbA1c from yesterday: higher. She tells me she stopped coconut oil, but OTW, she continued the diet mostly.  She is depressed, cannot afford her psychotropic meds.  Her last A1c was: Lab Results  Component Value Date   HGBA1C 8.2 (H) 08/01/2016   HGBA1C 7.6 (H) 02/26/2016   HGBA1C 8.9 11/17/2015   Previous treatment regimen: - VGo 20 + 3 clicks per meal - since last year >> bruising and numbness >> would like switch to injections - JanuMet 50-1000 mg daily bid She wanted to come off the VGo as it caused skin changes.  She is now on: - Lantus 24 >> 26 >> 20 units at bedtime >> Misses many doses, days at a time - per her log - checks sugars 1x a day - NovoLog 6 >> 10 >> 5 units 15 min before every meal - 3x a day - Sliding scale of NovoLog - does not need to use it lately: - 150-175: + 1 unit  - 176-200: + 2 units  - 201-225: + 3 units  - >225: + 4 units  She was out of Janumet 50-1000 mg 2x a day - for 2 months 2/2 insurance coverage. We started Metformin at last visit >> she stopped.  Does not bring a log >> checks 1x a day: - am: 120-190 >> 140s >> 145-155 >> 80-110 >> 90-97 >> 140-160 >> 65-113 >> end of her day (works nights): 133-267 - 2h after b'fast: n/c >> highest 165 (pancakes) >> n/c - before lunch: 140-160 >> n/c - 2h after lunch: n/c >> 108-120 >> n/c - before dinner: 170-214 >> 160s >> n/c >> 100-103 >> 200s >> (waking up)  65-80s >> n/c - bedtime: 140-150s >> n/c She has hypoglycemia awareness at 60. Lowest 65.   She was vegan but was advised by GI to switch to regular diet (diverticulosis on  colonoscopy).   - Last eye appt: 02/2015. Reportedly, no DR.  - No CKD: Lab Results  Component Value Date   BUN 16 08/01/2016   BUN 16 02/26/2016   Lab Results  Component Value Date   CREATININE 0.62 08/01/2016   CREATININE 0.59 02/26/2016  Not on ACEI/ARB.  Last ACR: Lab Results  Component Value Date   MICRALBCREAT 23.1 06/25/2015   MICRALBCREAT 7.9 04/15/2014   MICRALBCREAT 8.1 02/05/2013   - She has HL: Lab Results  Component Value Date   CHOL 253 (H) 08/01/2016   HDL 58.60 08/01/2016   LDLCALC 175 (H) 08/01/2016   LDLDIRECT 146.2 02/05/2013   TRIG 94.0 08/01/2016   CHOLHDL 4 08/01/2016  On Pravastatin >> we switched to Lipitor  >> she did not start this! - She does have foot pain, likely from back pain.   She also has a history of being bipolar, migraines, and asthma. Also has RA by Dr. Ouida Osborn (rheum.).  Husband died in 08-12-2015 (hemorrhagic stroke).  Review of Systems Constitutional: no weight gain, no fatigue Eyes: no blurry vision, no xerophthalmia ENT: no sore throat, no nodules palpated in throat, no dysphagia/odynophagia, no hoarseness Cardiovascular: no CP/SOB/palpitations/leg swelling Respiratory: no cough/SOB  Gastrointestinal: no N/V/D/C  Musculoskeletal: no muscle/joint pain Skin: no rashes Neurological: no tremors/numbness/tingling/dizziness  I reviewed pt's medications, allergies, PMH, social hx, family hx, and changes were documented in the history of present illness. Otherwise, unchanged from my initial visit note.  Objective:   Physical Exam BP 130/80 (BP Location: Left Arm, Patient Position: Sitting)   Pulse 79   Ht 5\' 4"  (1.626 m)   Wt 211 lb (95.7 kg)   SpO2 97%   BMI 36.22 kg/m  Body mass index is 36.22 kg/m. Wt Readings from Last 3 Encounters:  08/02/16 211 lb (95.7 kg)  08/01/16 212 lb 6.4 oz (96.3 kg)  06/08/16 208 lb 12.8 oz (94.7 kg)  Constitutional: overweight, in NAD Eyes: PERRLA, EOMI, no exophthalmos ENT: moist mucous  membranes, no thyromegaly, no cervical lymphadenopathy Cardiovascular: RRR, No MRG Respiratory: CTA B Gastrointestinal: abdomen soft, NT, ND, BS+ Musculoskeletal: no deformities, strength intact in all 4  Assessment:     1. DM2, without complications, uncontrolled, insulin-dependent, with Hyperglycemia  2. HL    Plan:     1. DM2 - Patient with long standing DM2, now with worse control. Before last visit, she started a ketogenic diet and her sugars were drastically improved >> we could reduce her insulins. She had stopped metformin from before, to limit the number of medicines she is taking... Now, with worse sugars as she misses many insulin doses. Will keep the same doses, but advised compliance. Will also add back Metformin. - she also needs to start checking sugars more frequently >> given new log - I advised her to:  Patient Instructions  Please continue the insulin as below: - Lantus 20 units at night - Novolog: 5 units before meals  DO NOT miss doses!  Please restart Metformin, 500 mg 2x a day with meals.  Please schedule a new eye exam.  Please return in 1.5 months with your sugar log.   - She needs a new eye exam >> advised to schedule - Reviewed last HbA1c, which was higher yesterday >> see changes above - I will see her back in 3 months with her sugar log  2. HL - On Pravastatin now, but switched to Lipitor, due to insurance preference - last LDL was very high, 175 >> advised to start Lipitor!  Kerri Kingdom, MD PhD [facility]

## 2016-08-02 NOTE — Patient Instructions (Addendum)
Please continue the insulin as below: - Lantus 20 units at night - Novolog: 5 units before meals  DO NOT miss doses!  Please restart Metformin, 500 mg 2x a day with meals.  Please schedule a new eye exam.  Please return in 1.5 months with your sugar log.

## 2016-08-02 NOTE — Addendum Note (Signed)
Addended by: Agnes Lawrence on: 08/02/2016 02:20 PM   Modules accepted: Orders

## 2016-08-30 ENCOUNTER — Other Ambulatory Visit: Payer: Self-pay | Admitting: Family Medicine

## 2016-09-01 ENCOUNTER — Other Ambulatory Visit: Payer: Self-pay | Admitting: *Deleted

## 2016-09-02 ENCOUNTER — Other Ambulatory Visit: Payer: Self-pay

## 2016-09-02 MED ORDER — INSULIN ASPART 100 UNIT/ML FLEXPEN: 5.0000 [IU] | PEN_INJECTOR | Freq: Three times a day (TID) | SUBCUTANEOUS | 1 refills | Status: DC

## 2016-09-02 MED ORDER — INSULIN GLARGINE 100 UNIT/ML SOLOSTAR PEN: PEN_INJECTOR | SUBCUTANEOUS | 0 refills | Status: DC

## 2016-09-22 ENCOUNTER — Ambulatory Visit: Admitting: Internal Medicine

## 2016-09-22 DIAGNOSIS — Z0289 Encounter for other administrative examinations: Secondary | ICD-10-CM

## 2016-11-19 ENCOUNTER — Encounter (HOSPITAL_COMMUNITY): Payer: Self-pay | Admitting: Emergency Medicine

## 2016-11-19 ENCOUNTER — Emergency Department (HOSPITAL_COMMUNITY)
Admission: EM | Admit: 2016-11-19 | Discharge: 2016-11-19 | Disposition: A | Discharge status: Home / Self Care | Attending: Emergency Medicine | Admitting: Emergency Medicine

## 2016-11-19 DIAGNOSIS — I252 Old myocardial infarction: Secondary | ICD-10-CM | POA: Insufficient documentation

## 2016-11-19 DIAGNOSIS — M5440 Lumbago with sciatica, unspecified side: Secondary | ICD-10-CM | POA: Insufficient documentation

## 2016-11-19 DIAGNOSIS — I2510 Atherosclerotic heart disease of native coronary artery without angina pectoris: Secondary | ICD-10-CM | POA: Insufficient documentation

## 2016-11-19 DIAGNOSIS — Z79899 Other long term (current) drug therapy: Secondary | ICD-10-CM | POA: Diagnosis not present

## 2016-11-19 DIAGNOSIS — E1165 Type 2 diabetes mellitus with hyperglycemia: Secondary | ICD-10-CM | POA: Insufficient documentation

## 2016-11-19 DIAGNOSIS — Z794 Long term (current) use of insulin: Secondary | ICD-10-CM | POA: Diagnosis not present

## 2016-11-19 DIAGNOSIS — M545 Low back pain: Secondary | ICD-10-CM | POA: Diagnosis present

## 2016-11-19 DIAGNOSIS — J45909 Unspecified asthma, uncomplicated: Secondary | ICD-10-CM | POA: Insufficient documentation

## 2016-11-19 DIAGNOSIS — R739 Hyperglycemia, unspecified: Secondary | ICD-10-CM

## 2016-11-19 LAB — URINALYSIS, ROUTINE W REFLEX MICROSCOPIC
Bilirubin Urine: NEGATIVE
Glucose, UA: >1000 mg/dL — AB
Hgb urine dipstick: NEGATIVE
Ketones, ur: NEGATIVE mg/dL
LEUKOCYTES UA: NEGATIVE
NITRITE: NEGATIVE
PROTEIN: NEGATIVE mg/dL
SPECIFIC GRAVITY, URINE: 1.039 — AB (ref 1.005–1.030)
pH: 5.5 (ref 5.0–8.0)

## 2016-11-19 LAB — URINE MICROSCOPIC-ADD ON
Bacteria, UA: NONE SEEN
RBC / HPF: NONE SEEN RBC/hpf (ref 0–5)
WBC, UA: NONE SEEN WBC/hpf (ref 0–5)

## 2016-11-19 LAB — CBG MONITORING, ED: GLUCOSE-CAPILLARY: 274 mg/dL — AB (ref 65–99)

## 2016-11-19 MED ORDER — METHOCARBAMOL 500 MG PO TABS: 500.0000 mg | ORAL_TABLET | Freq: Every evening | ORAL | 0 refills | Status: DC | PRN

## 2016-11-19 MED ORDER — HYDROCODONE-ACETAMINOPHEN 5-325 MG PO TABS: 1.0000 | ORAL_TABLET | ORAL | 0 refills | Status: DC | PRN

## 2016-11-19 MED ORDER — IBUPROFEN 200 MG PO TABS
600.0000 mg | ORAL_TABLET | Freq: Once | ORAL | Status: AC
  Administered 2016-11-19: 600 mg via ORAL
  Filled 2016-11-19: qty 3

## 2016-11-19 MED ORDER — METHOCARBAMOL 500 MG PO TABS
500.0000 mg | ORAL_TABLET | Freq: Once | ORAL | Status: AC
  Administered 2016-11-19: 500 mg via ORAL
  Filled 2016-11-19: qty 1

## 2016-11-19 NOTE — ED Provider Notes (Signed)
[facility] DEPT Provider Note    By signing my name below, I, Kerri Osborn, attest that this documentation has been prepared under the direction and in the presence of Kerri Hora, PA-C. Electronically Signed: Bea Osborn, ED Scribe. 11/19/16. 4:42 PM.   History   Chief Complaint Chief Complaint  Patient presents with  . Back Pain   The history is provided by the patient and medical records. No language interpreter was used.    HPI Comments:  Kerri Osborn is an obese 55 y.o. female with PMHx of chronic back pain, RA, sciatica, ruptured disc and DM who presents to the Emergency Department complaining of throbbing low back pain that has been ongoing for approximately two years. She states the pain has gotten severe in the past couple days. She reports burning back pain when she has to have a bowel movement. She also reports increased frequency of urination. She reports the pain radiates down her left leg. She is scheduled for a pain injection with [employer] in 10 days. She has taken Aleve with temporary relief of the pain. She states she has taken Gabapentin in the past with moderate relief but it was discontinued since she is going to start the pain injections. Sitting increases her pain. She denies alleviating factors. She denies rash, dysuria, fever, chills, numbness, tingling or weakness of the lower extremities, bowel or bladder incontinence. She is ambulatory without assistance. Her PCP is Dr. Colin Benton.   Past Medical History:  Diagnosis Date  . Allergy   . Arthritis   . Arthritis, rheumatoid ([place])   . Asthma   . Bipolar disorder ([place])   . Chicken pox   . Coronary artery disease   . Depression   . Diabetes mellitus   . Diverticulosis   . Dyspnea 04/13/2011   pfts 03/2011:  Normal FEV1 and FEV1/FVC Ct chest 2012:  No PE, normal parenchyma Kerri Osborn 04/18/2011 with active symptoms:  Normal FEV1%, mild decrease in FVC due to restriction vs airtrapping,  normal appearing       flow volume loop.    . Fatty liver   . Migraines   . Myocardial infarction   . Polyarthralgia - managed by Dr. Ouida Sills per her report 02/05/2013  . 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 Seasonal allergies     Patient Active Problem List   Diagnosis Date Noted  . Type 2 diabetes mellitus with hyperglycemia, with long-term current use of insulin ([place]) 05/02/2016  . Hyperlipidemia 04/14/2016  . Obesity 04/14/2016  . Bipolar affective disorder ([place]) 02/05/2013  . Polyarthralgia - managed by Dr. Ouida Sills per her report 02/05/2013    Past Surgical History:  Procedure Laterality Date  . ABDOMINAL HYSTERECTOMY    . APPENDECTOMY    . BREAST CYST EXCISION    . CESAREAN SECTION    . INNER EAR SURGERY     tubes in ears  . MYOMECTOMY    . TUBAL LIGATION      OB History    No data available       Home Medications    Prior to Admission medications   Medication Sig Start Date End Date Taking? Authorizing Provider  albuterol (PROVENTIL HFA;VENTOLIN HFA) 108 (90 BASE) MCG/ACT inhaler Inhale 2 puffs into the lungs every 6 (six) hours as needed for wheezing or shortness of breath. 11/17/14   Kerri Kingdom, MD  atorvastatin (LIPITOR) 20 MG tablet Take 1 tablet (20 mg total) by mouth daily. 08/02/16  Kerri Kern, DO  busPIRone (BUSPAR) 15 MG tablet Take 1/2 tablet by mouth 2 times daily. 11/17/14   Kerri Kingdom, MD  CINNAMON PO Take 1 tablet by mouth daily.     Historical Provider, MD  cyclobenzaprine (FLEXERIL) 10 MG tablet Take 1 tablet (10 mg total) by mouth 3 (three) times daily as needed for muscle spasms. 06/08/16   Kerri Beam, MD  FREESTYLE LITE test strip USE TO TEST BLOOD SUGAR TWICE A DAY AS INSTRUCTED 01/26/16   Kerri Kingdom, MD  gabapentin (NEURONTIN) 300 MG capsule Take 1 capsule (300 mg total) by mouth 3 (three) times daily. 06/08/16   Kerri Beam, MD  HYDROcodone-acetaminophen (NORCO/VICODIN) 5-325 MG tablet take 1 to 2 tablets  by mouth every 4 hours if needed for pain 06/10/16   Historical Provider, MD  ibuprofen (ADVIL,MOTRIN) 600 MG tablet take 1 tablet by mouth every 6 hours if needed with food 06/10/16   Historical Provider, MD  insulin aspart (NOVOLOG FLEXPEN) 100 UNIT/ML FlexPen Inject 5 Units into the skin 3 (three) times daily with meals. 09/02/16   Kerri Kingdom, MD  Insulin Glargine (LANTUS SOLOSTAR) 100 UNIT/ML Solostar Pen INJECT 20 UNITS UNDER THE SKIN DAILY AT 10 P.M. 09/02/16   Kerri Kingdom, MD  Insulin Pen Needle (CAREFINE PEN NEEDLES) 32G X 4 MM MISC Use to inject insulin 4 times daily. 10/29/15   Kerri Kingdom, MD  lamoTRIgine (LAMICTAL) 100 MG tablet Take 1 tablet (100 mg total) by mouth daily. 11/17/14   Kerri Kingdom, MD  Lancets (FREESTYLE) lancets Use to test blood sugar 2 times daily as instructed. 10/29/15   Kerri Kingdom, MD  metFORMIN (GLUCOPHAGE) 500 MG tablet Take 1 tablet (500 mg total) by mouth 2 (two) times daily with a meal. 08/02/16   Kerri Kingdom, MD  sertraline (ZOLOFT) 100 MG tablet Take 1 tablet (100 mg total) by mouth daily. 11/17/14   Kerri Kingdom, MD  TURMERIC PO Take 2 mLs by mouth daily.    Historical Provider, MD    Family History Family History  Problem Relation Age of Onset  . Sudden death Mother   . Kidney failure Mother   . Sudden death Father   . Allergies Sister   . Allergies Daughter   . Allergies Daughter   . Asthma Daughter   . Asthma Daughter   . Arthritis Maternal Grandmother   . Hyperlipidemia Maternal Grandmother   . Diabetes Maternal Grandmother   . Hypertension Maternal Aunt   . Mental illness    . Colon cancer Neg Hx   . Rectal cancer Neg Hx   . Stomach cancer Neg Hx   . Neuropathy Neg Hx     Social History Social History  Substance Use Topics  . Smoking status: Never Smoker  . Smokeless tobacco: Never Used  . Alcohol use No     Allergies   Amoxicillin   Review of Systems Review of Systems  Constitutional:  Negative for chills and fever.  Genitourinary: Positive for frequency. Negative for dysuria.       No bowel or bladder incontinence  Musculoskeletal: Positive for back pain.  Skin: Negative for color change, rash and wound.  Neurological: Negative for weakness and numbness.     Physical Exam Updated Vital Signs BP 161/91 (BP Location: Left Arm)   Pulse 98   Temp 98.4 F (36.9 C) (Oral)   Resp 18   SpO2 98%   Physical Exam  Constitutional: She is oriented to person, place,  and time. She appears well-developed and well-nourished. No distress.  Standing in room - unable to sit  HENT:  Head: Normocephalic and atraumatic.  Neck: Normal range of motion.  Cardiovascular: Normal rate.   Pulmonary/Chest: Effort normal.  Musculoskeletal: Normal range of motion. She exhibits tenderness. She exhibits no edema or deformity.  Back: Inspection: No masses, deformity, or rash Palpation: Tenderness to palpation to lumbar spine with paraspinal muscle tenderness Strength: 5/5 in lower extremities and normal plantar and dorsiflexion Sensation: Intact sensation with light touch in lower extremities bilaterally Gait: Antalgic gait SLR: Negative seated straight leg raise  Neurological: She is alert and oriented to person, place, and time.  Skin: Skin is warm and dry.  Psychiatric: She has a normal mood and affect. Her behavior is normal.  Nursing note and vitals reviewed.    ED Treatments / Results  DIAGNOSTIC STUDIES: Oxygen Saturation is 98% on RA, normal by my interpretation.   COORDINATION OF CARE: 3:42 PM- Advised pt to continue taking Aleve twice daily. Will prescribe Flexeril and pain medication. Will order urinalysis. Pt verbalizes understanding and agrees to plan.  Medications  methocarbamol (ROBAXIN) tablet 500 mg (500 mg Oral Given 11/19/16 1607)  ibuprofen (ADVIL,MOTRIN) tablet 600 mg (600 mg Oral Given 11/19/16 1607)    Labs (all labs ordered are listed, but only abnormal  results are displayed) Labs Reviewed  URINALYSIS, ROUTINE W REFLEX MICROSCOPIC (NOT AT [facility]) - Abnormal; Notable for the following:       Result Value   Specific Gravity, Urine 1.039 (*)    Glucose, UA >1000 (*)    All other components within normal limits  URINE MICROSCOPIC-ADD ON - Abnormal; Notable for the following:    Squamous Epithelial / LPF 0-5 (*)    Crystals CA OXALATE CRYSTALS (*)    All other components within normal limits  CBG MONITORING, ED - Abnormal; Notable for the following:    Glucose-Capillary 274 (*)    All other components within normal limits    EKG  EKG Interpretation None       Radiology No results found.  Procedures Procedures (including critical care time)  Medications Ordered in ED Medications  methocarbamol (ROBAXIN) tablet 500 mg (500 mg Oral Given 11/19/16 1607)  ibuprofen (ADVIL,MOTRIN) tablet 600 mg (600 mg Oral Given 11/19/16 1607)     Initial Impression / Assessment and Plan / ED Course  I have reviewed the triage vital signs and the nursing notes.  Pertinent labs & imaging results that were available during my care of the patient were reviewed by me and considered in my medical decision making (see chart for details).  Clinical Course    Patient with back pain.  No neurological deficits and normal neuro exam.  Patient is ambulatory.  No loss of bowel or bladder control.  No concern for cauda equina.  No fever, night sweats, weight loss, h/o cancer, IVDA, no recent procedure to back. UA remarkable for high specific gravity and >1000 glucose in urine. CBG is 274.  Advised close follow up with PCP regarding diabetic meds and plenty of fluids. Supportive care and return precaution discussed. Appears safe for discharge at this time. Follow up as indicated in discharge paperwork.   I personally performed the services described in this documentation, which was scribed in my presence. The recorded information has been reviewed and is  accurate.   Final Clinical Impressions(s) / ED Diagnoses   Final diagnoses:  Acute bilateral low back pain with sciatica, sciatica laterality  unspecified  Hyperglycemia    New Prescriptions New Prescriptions   No medications on file     Recardo Evangelist, PA-C 11/20/16 [place], MD 11/20/16 [phone]

## 2016-11-19 NOTE — Discharge Instructions (Signed)
Take anti-inflammatory medicines for the next week. Take this medicine with food. Take muscle relaxer at bedtime to help you sleep. This medicine makes you drowsy so do not take before driving or work Use a heating pad for sore muscles - use for 20 minutes several times a day Take narcotic pain medicine when pain is severe - this medicine will also make you drowsy Follow up with primary care doctor about blood sugar. Drink plenty of fluids

## 2016-11-19 NOTE — ED Triage Notes (Signed)
Per pt, states chronic lower back pain, states due for pain injection on the 12 th-unable to tolerate pain

## 2016-11-19 NOTE — ED Triage Notes (Signed)
CBG 274 

## 2016-12-02 ENCOUNTER — Ambulatory Visit: Admitting: Family Medicine

## 2016-12-05 ENCOUNTER — Ambulatory Visit: Admitting: Family Medicine

## 2017-01-05 ENCOUNTER — Encounter (HOSPITAL_COMMUNITY): Payer: Self-pay | Admitting: Emergency Medicine

## 2017-01-05 ENCOUNTER — Emergency Department (HOSPITAL_COMMUNITY)
Admission: EM | Admit: 2017-01-05 | Discharge: 2017-01-05 | Disposition: A | Discharge status: Home / Self Care | Attending: Emergency Medicine | Admitting: Emergency Medicine

## 2017-01-05 ENCOUNTER — Emergency Department (HOSPITAL_COMMUNITY)

## 2017-01-05 DIAGNOSIS — Z79899 Other long term (current) drug therapy: Secondary | ICD-10-CM | POA: Insufficient documentation

## 2017-01-05 DIAGNOSIS — I252 Old myocardial infarction: Secondary | ICD-10-CM | POA: Diagnosis not present

## 2017-01-05 DIAGNOSIS — I2510 Atherosclerotic heart disease of native coronary artery without angina pectoris: Secondary | ICD-10-CM | POA: Insufficient documentation

## 2017-01-05 DIAGNOSIS — Z794 Long term (current) use of insulin: Secondary | ICD-10-CM | POA: Diagnosis not present

## 2017-01-05 DIAGNOSIS — E119 Type 2 diabetes mellitus without complications: Secondary | ICD-10-CM | POA: Diagnosis not present

## 2017-01-05 DIAGNOSIS — J45901 Unspecified asthma with (acute) exacerbation: Secondary | ICD-10-CM | POA: Diagnosis not present

## 2017-01-05 DIAGNOSIS — J069 Acute upper respiratory infection, unspecified: Secondary | ICD-10-CM

## 2017-01-05 DIAGNOSIS — R05 Cough: Secondary | ICD-10-CM | POA: Diagnosis present

## 2017-01-05 LAB — CBC
HEMATOCRIT: 38 % (ref 36.0–46.0)
Hemoglobin: 12.6 g/dL (ref 12.0–15.0)
MCH: 27.5 pg (ref 26.0–34.0)
MCHC: 33.2 g/dL (ref 30.0–36.0)
MCV: 83 fL (ref 78.0–100.0)
Platelets: 207 10*3/uL (ref 150–400)
RBC: 4.58 MIL/uL (ref 3.87–5.11)
RDW: 13.8 % (ref 11.5–15.5)
WBC: 10.8 10*3/uL — AB (ref 4.0–10.5)

## 2017-01-05 LAB — BASIC METABOLIC PANEL
Anion gap: 6 (ref 5–15)
BUN: 16 mg/dL (ref 6–20)
CALCIUM: 9 mg/dL (ref 8.9–10.3)
CO2: 29 mmol/L (ref 22–32)
Chloride: 102 mmol/L (ref 101–111)
Creatinine, Ser: 0.52 mg/dL (ref 0.44–1.00)
GFR calc Af Amer: >60 mL/min (ref >60)
Glucose, Bld: 269 mg/dL — ABNORMAL HIGH (ref 65–99)
POTASSIUM: 3.7 mmol/L (ref 3.5–5.1)
SODIUM: 137 mmol/L (ref 135–145)

## 2017-01-05 LAB — I-STAT TROPONIN, ED: TROPONIN I, POC: 0 ng/mL (ref 0.00–0.08)

## 2017-01-05 MED ORDER — ALBUTEROL SULFATE HFA 108 (90 BASE) MCG/ACT IN AERS: 1.0000 | INHALATION_SPRAY | Freq: Four times a day (QID) | RESPIRATORY_TRACT | 0 refills | Status: DC | PRN

## 2017-01-05 MED ORDER — AZITHROMYCIN 250 MG PO TABS: 250.0000 mg | ORAL_TABLET | Freq: Every day | ORAL | 0 refills | Status: DC

## 2017-01-05 MED ORDER — PREDNISONE 20 MG PO TABS: 40.0000 mg | ORAL_TABLET | Freq: Every day | ORAL | 0 refills | Status: DC

## 2017-01-05 MED ORDER — PREDNISONE 20 MG PO TABS
60.0000 mg | ORAL_TABLET | Freq: Once | ORAL | Status: AC
  Administered 2017-01-05: 60 mg via ORAL
  Filled 2017-01-05: qty 3

## 2017-01-05 MED ORDER — IPRATROPIUM-ALBUTEROL 0.5-2.5 (3) MG/3ML IN SOLN
3.0000 mL | Freq: Once | RESPIRATORY_TRACT | Status: AC
  Administered 2017-01-05: 3 mL via RESPIRATORY_TRACT
  Filled 2017-01-05: qty 3

## 2017-01-05 NOTE — ED Provider Notes (Signed)
[facility] DEPT Provider Note   CSN: [id number] Arrival date & time: 01/05/17  1028     History   Chief Complaint Chief Complaint  Patient presents with  . Cough  . Shortness of Breath    HPI Kerri Osborn is a 56 y.o. female who presents with cough and SOB. PMH significant for asthma, chronic back pain, and Type 2 DM. She states that over the past week she started to become more SOB. She has been using her inhaler more and is close to running out. She then started to have nasal congestion and rhinorrhea followed by a cough. Now she has chest pain and throat pain when she coughs and post-tussive emesis. She has not been taking her medicines because she is afraid to cough and vomit them up. Denies fever, chills, ear pain, palpations, leg swelling, abdominal pain, nausea, diarrhea, dysuria.  HPI  Past Medical History:  Diagnosis Date  . Allergy   . Arthritis   . Arthritis, rheumatoid ([place])   . Asthma   . Bipolar disorder ([place])   . Chicken pox   . Coronary artery disease   . Depression   . Diabetes mellitus   . Diverticulosis   . Dyspnea 04/13/2011   pfts 03/2011:  Normal FEV1 and FEV1/FVC Ct chest 2012:  No PE, normal parenchyma [name] 04/18/2011 with active symptoms:  Normal FEV1%, mild decrease in FVC due to restriction vs airtrapping, normal appearing       flow volume loop.    . Fatty liver   . Migraines   . Myocardial infarction   . Polyarthralgia - managed by Dr. Ouida Sills per her report 02/05/2013  . 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 Seasonal allergies     Patient Active Problem List   Diagnosis Date Noted  . Type 2 diabetes mellitus with hyperglycemia, with long-term current use of insulin ([place]) 05/02/2016  . Hyperlipidemia 04/14/2016  . Obesity 04/14/2016  . Bipolar affective disorder ([place]) 02/05/2013  . Polyarthralgia - managed by Dr. Ouida Sills per her report 02/05/2013    Past Surgical History:  Procedure Laterality Date  . ABDOMINAL  HYSTERECTOMY    . APPENDECTOMY    . BREAST CYST EXCISION    . CESAREAN SECTION    . INNER EAR SURGERY     tubes in ears  . MYOMECTOMY    . TUBAL LIGATION      OB History    No data available       Home Medications    Prior to Admission medications   Medication Sig Start Date End Date Taking? Authorizing Provider  albuterol (PROVENTIL HFA;VENTOLIN HFA) 108 (90 BASE) MCG/ACT inhaler Inhale 2 puffs into the lungs every 6 (six) hours as needed for wheezing or shortness of breath. 11/17/14  Yes Philemon Kingdom, MD  busPIRone (BUSPAR) 15 MG tablet Take 1/2 tablet by mouth 2 times daily. 11/17/14  Yes Philemon Kingdom, MD  cyclobenzaprine (FLEXERIL) 10 MG tablet Take 1 tablet (10 mg total) by mouth 3 (three) times daily as needed for muscle spasms. 06/08/16  Yes Melvenia Beam, MD  lamoTRIgine (LAMICTAL) 100 MG tablet Take 1 tablet (100 mg total) by mouth daily. 11/17/14  Yes Philemon Kingdom, MD  metFORMIN (GLUCOPHAGE) 500 MG tablet Take 1 tablet (500 mg total) by mouth 2 (two) times daily with a meal. 08/02/16  Yes Philemon Kingdom, MD  sertraline (ZOLOFT) 100 MG tablet Take 1 tablet (100 mg total) by mouth daily. 11/17/14  Yes Philemon Kingdom, MD  atorvastatin (LIPITOR) 20 MG tablet Take 1 tablet (20 mg total) by mouth daily. Patient not taking: Reported on 01/05/2017 08/02/16   Lucretia Kern, DO  FREESTYLE LITE test strip USE TO TEST BLOOD SUGAR TWICE A DAY AS INSTRUCTED 01/26/16   Philemon Kingdom, MD  gabapentin (NEURONTIN) 300 MG capsule Take 1 capsule (300 mg total) by mouth 3 (three) times daily. 06/08/16   Melvenia Beam, MD  HYDROcodone-acetaminophen (NORCO/VICODIN) 5-325 MG tablet Take 1 tablet by mouth every 4 (four) hours as needed. Patient not taking: Reported on 01/05/2017 11/19/16   Recardo Evangelist, PA-C  insulin aspart (NOVOLOG FLEXPEN) 100 UNIT/ML FlexPen Inject 5 Units into the skin 3 (three) times daily with meals. Patient not taking: Reported on 01/05/2017 09/02/16    Philemon Kingdom, MD  Insulin Glargine (LANTUS SOLOSTAR) 100 UNIT/ML Solostar Pen INJECT 20 UNITS UNDER THE SKIN DAILY AT 10 P.M. Patient not taking: Reported on 01/05/2017 09/02/16   Philemon Kingdom, MD  Insulin Pen Needle (CAREFINE PEN NEEDLES) 32G X 4 MM MISC Use to inject insulin 4 times daily. 10/29/15   Philemon Kingdom, MD  Lancets (FREESTYLE) lancets Use to test blood sugar 2 times daily as instructed. 10/29/15   Philemon Kingdom, MD  methocarbamol (ROBAXIN) 500 MG tablet Take 1 tablet (500 mg total) by mouth at bedtime and may repeat dose one time if needed. 11/19/16   Recardo Evangelist, PA-C    Family History Family History  Problem Relation Age of Onset  . Sudden death Mother   . Kidney failure Mother   . Sudden death Father   . Allergies Sister   . Allergies Daughter   . Allergies Daughter   . Asthma Daughter   . Asthma Daughter   . Arthritis Maternal Grandmother   . Hyperlipidemia Maternal Grandmother   . Diabetes Maternal Grandmother   . Hypertension Maternal Aunt   . Mental illness    . Colon cancer Neg Hx   . Rectal cancer Neg Hx   . Stomach cancer Neg Hx   . Neuropathy Neg Hx     Social History Social History  Substance Use Topics  . Smoking status: Never Smoker  . Smokeless tobacco: Never Used  . Alcohol use No     Allergies   Amoxicillin   Review of Systems Review of Systems  Constitutional: Negative for chills and fever.  HENT: Positive for congestion.   Respiratory: Positive for cough, shortness of breath and wheezing.   Cardiovascular: Positive for chest pain (with coughing).  Gastrointestinal: Positive for vomiting (post-tussive). Negative for abdominal pain, diarrhea and nausea.  All other systems reviewed and are negative.    Physical Exam Updated Vital Signs BP 151/74   Pulse 89   Temp 97.9 F (36.6 C) (Oral)   Resp 20   SpO2 98%   Physical Exam  Constitutional: She is oriented to person, place, and time. She appears  well-developed and well-nourished. No distress.  Walking around room, NAD. Occasional cough  HENT:  Head: Normocephalic and atraumatic.  Right Ear: Hearing, tympanic membrane, external ear and ear canal normal.  Left Ear: Hearing, tympanic membrane, external ear and ear canal normal.  Nose: Mucosal edema and rhinorrhea present.  Mouth/Throat: Uvula is midline, oropharynx is clear and moist and mucous membranes are normal.  Eyes: Conjunctivae are normal. Pupils are equal, round, and reactive to light. Right eye exhibits no discharge. Left eye exhibits no discharge. No scleral icterus.  Neck: Normal range  of motion.  Cardiovascular: Normal rate and regular rhythm.  Exam reveals no gallop and no friction rub.   No murmur heard. Pulmonary/Chest: Effort normal and breath sounds normal. No respiratory distress. She has no wheezes. She has no rales. She exhibits no tenderness.  Abdominal: She exhibits no distension.  Neurological: She is alert and oriented to person, place, and time.  Skin: Skin is warm and dry.  Psychiatric: She has a normal mood and affect. Her behavior is normal.  Nursing note and vitals reviewed.    ED Treatments / Results  Labs (all labs ordered are listed, but only abnormal results are displayed) Labs Reviewed  BASIC METABOLIC PANEL - Abnormal; Notable for the following:       Result Value   Glucose, Bld 269 (*)    All other components within normal limits  CBC - Abnormal; Notable for the following:    WBC 10.8 (*)    All other components within normal limits  I-STAT TROPOININ, ED    EKG  EKG Interpretation  Date/Time:  Thursday January 05 2017 10:36:32 EST Ventricular Rate:  91 PR Interval:    QRS Duration: 74 QT Interval:  357 QTC Calculation: 440 R Axis:   9 Text Interpretation:  Sinus rhythm Probable left atrial enlargement Anteroseptal infarct, old T wave flattening similar to June 2016 Confirmed by Regenia Skeeter MD, [place] [phone]) on 01/05/2017 12:02:25  PM       Radiology Dg Chest 2 View  Result Date: 01/05/2017 CLINICAL DATA:  Worsening cough, shortness of breath, and chest pain for 2 days. Asthma. EXAM: CHEST  2 VIEW COMPARISON:  08/22/2012 FINDINGS: The heart size and mediastinal contours are within normal limits. Mild left lower lung scarring it is unchanged. No evidence of pulmonary infiltrate or edema. No evidence of pleural effusion. Mild thoracic dextroscoliosis again noted. IMPRESSION: No active cardiopulmonary disease. Electronically Signed   By: Earle Gell M.D.   On: 01/05/2017 11:01    Procedures Procedures (including critical care time)  Medications Ordered in ED Medications  ipratropium-albuterol (DUONEB) 0.5-2.5 (3) MG/3ML nebulizer solution 3 mL (3 mLs Nebulization Given 01/05/17 1250)  predniSONE (DELTASONE) tablet 60 mg (60 mg Oral Given 01/05/17 1250)     Initial Impression / Assessment and Plan / ED Course  I have reviewed the triage vital signs and the nursing notes.  Pertinent labs & imaging results that were available during my care of the patient were reviewed by me and considered in my medical decision making (see chart for details).  56 year old female presents with asthma exacerbation due to URI. She is hypertensive but otherwise vitals are normal. Lungs reveal decreased breath sounds with mild expiratory wheezing. Discussed treatment with steroids. Since symptoms have been progressive for the past week I think a steroid burst is reasonable. Duoneb tx given in ED.   Repeat lung exam is improved. Rx for steroid, albuterol, and Z-pack given. Return precautions given.   Final Clinical Impressions(s) / ED Diagnoses   Final diagnoses:  Viral upper respiratory tract infection  Mild asthma with exacerbation, unspecified whether persistent    New Prescriptions New Prescriptions   No medications on file     Recardo Evangelist, PA-C 01/05/17 [place], MD 01/09/17 [phone]

## 2017-01-05 NOTE — Discharge Instructions (Signed)
Use Tylenol or Motrin for pain/fever Use Flonase for nasal congestion Follow up with PCP if symptoms aren't improving Return to the ED for worsening symptoms

## 2017-01-05 NOTE — ED Triage Notes (Addendum)
Pt reports CP, SOB, back pain, and cough. Hx of asthma, but back pain is unusual for her. Pain worse with cough

## 2017-02-21 ENCOUNTER — Ambulatory Visit (INDEPENDENT_AMBULATORY_CARE_PROVIDER_SITE_OTHER): Admitting: Family Medicine

## 2017-02-21 ENCOUNTER — Encounter: Payer: Self-pay | Admitting: Family Medicine

## 2017-02-21 VITALS — BP 122/62 | HR 83 | Temp 97.5°F | Ht 64.0 in | Wt 197.4 lb

## 2017-02-21 DIAGNOSIS — H6982 Other specified disorders of Eustachian tube, left ear: Secondary | ICD-10-CM

## 2017-02-21 DIAGNOSIS — H6592 Unspecified nonsuppurative otitis media, left ear: Secondary | ICD-10-CM

## 2017-02-21 NOTE — Progress Notes (Signed)
HPI:  Kerri Osborn is a pleasant 55 year old with a past medical history of asthma and allergies here for an acute visit for left ear discomfort. She reports a history of issues with fluid in his left ear and ear tubes placed as an adult about 5-10 years ago. She has had some left ear pressure, mild hearing loss on the left, popping, fullness and pain for about 2 weeks. She has had minor allergy issues with clear nasal congestion and postnasal drip. No fevers, drainage from the ear, shortness of breath, asthma symptoms or body aches.  ROS: See pertinent positives and negatives per HPI.  Past Medical History:  Diagnosis Date  . Allergy   . Arthritis   . Arthritis, rheumatoid ([place])   . Asthma   . Bipolar disorder ([place])   . Chicken pox   . Coronary artery disease   . Depression   . Diabetes mellitus   . Diverticulosis   . Dyspnea 04/13/2011   pfts 03/2011:  Normal FEV1 and FEV1/FVC Ct chest 2012:  No PE, normal parenchyma [name] 04/18/2011 with active symptoms:  Normal FEV1%, mild decrease in FVC due to restriction vs airtrapping, normal appearing       flow volume loop.    . Fatty liver   . Migraines   . Myocardial infarction   . Polyarthralgia - managed by Dr. Ouida Sills per her report 02/05/2013  . 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 Seasonal allergies     Past Surgical History:  Procedure Laterality Date  . ABDOMINAL HYSTERECTOMY    . APPENDECTOMY    . BREAST CYST EXCISION    . CESAREAN SECTION    . INNER EAR SURGERY     tubes in ears  . MYOMECTOMY    . TUBAL LIGATION      Family History  Problem Relation Age of Onset  . Sudden death Mother   . Kidney failure Mother   . Sudden death Father   . Allergies Sister   . Allergies Daughter   . Allergies Daughter   . Asthma Daughter   . Asthma Daughter   . Arthritis Maternal Grandmother   . Hyperlipidemia Maternal Grandmother   . Diabetes Maternal Grandmother   . Hypertension Maternal Aunt   . Mental illness     . Colon cancer Neg Hx   . Rectal cancer Neg Hx   . Stomach cancer Neg Hx   . Neuropathy Neg Hx     Social History   Social History  . Marital status: Widowed    Spouse name: N/A  . Number of children: 2  . Years of education: 16   Occupational History  . [employer]     Social History Main Topics  . Smoking status: Never Smoker  . Smokeless tobacco: Never Used  . Alcohol use No  . Drug use: No  . Sexual activity: Not Asked   Other Topics Concern  . 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      Spiritual Beliefs:       Lifestyle: MWF does kickboxing, vegetarian - trying to go vegan      Caffeine use: Drinks tea rarely                     Current Outpatient Prescriptions:  .  albuterol (PROVENTIL HFA;VENTOLIN HFA)  108 (90 Base) MCG/ACT inhaler, Inhale 1-2 puffs into the lungs every 6 (six) hours as needed for wheezing or shortness of breath., Disp: 1 Inhaler, Rfl: 0 .  azithromycin (ZITHROMAX) 250 MG tablet, Take 1 tablet (250 mg total) by mouth daily. Take first 2 tablets together, then 1 every day until finished., Disp: 6 tablet, Rfl: 0 .  busPIRone (BUSPAR) 15 MG tablet, Take 1/2 tablet by mouth 2 times daily., Disp: 90 tablet, Rfl: 1 .  cyclobenzaprine (FLEXERIL) 10 MG tablet, Take 1 tablet (10 mg total) by mouth 3 (three) times daily as needed for muscle spasms., Disp: 90 tablet, Rfl: 3 .  FREESTYLE LITE test strip, USE TO TEST BLOOD SUGAR TWICE A DAY AS INSTRUCTED, Disp: 200 each, Rfl: 3 .  gabapentin (NEURONTIN) 300 MG capsule, Take 1 capsule (300 mg total) by mouth 3 (three) times daily., Disp: 90 capsule, Rfl: 11 .  Insulin Pen Needle (CAREFINE PEN NEEDLES) 32G X 4 MM MISC, Use to inject insulin 4 times daily., Disp: 360 each, Rfl: 0 .  lamoTRIgine (LAMICTAL) 100 MG tablet, Take 1 tablet (100 mg total) by mouth daily., Disp: 90 tablet, Rfl: 1 .  Lancets  (FREESTYLE) lancets, Use to test blood sugar 2 times daily as instructed., Disp: 200 each, Rfl: 0 .  metFORMIN (GLUCOPHAGE) 500 MG tablet, Take 1 tablet (500 mg total) by mouth 2 (two) times daily with a meal., Disp: 180 tablet, Rfl: 3 .  methocarbamol (ROBAXIN) 500 MG tablet, Take 1 tablet (500 mg total) by mouth at bedtime and may repeat dose one time if needed., Disp: 10 tablet, Rfl: 0 .  predniSONE (DELTASONE) 20 MG tablet, Take 2 tablets (40 mg total) by mouth daily., Disp: 10 tablet, Rfl: 0 .  sertraline (ZOLOFT) 100 MG tablet, Take 1 tablet (100 mg total) by mouth daily., Disp: 90 tablet, Rfl: 1  EXAM:  Vitals:   02/21/17 1441  BP: 122/62  Pulse: 83  Temp: 97.5 F (36.4 C)    Body mass index is 33.88 kg/m.  GENERAL: vitals reviewed and listed above, alert, oriented, appears well hydrated and in no acute distress  HEENT: atraumatic, conjunttiva clear, no obvious abnormalities on inspection of external nose and ears, normal appearance of ear canals and TMs except for clear effusion L, clear nasal congestion, mild post oropharyngeal erythema with PND, no tonsillar edema or exudate, no sinus TTP  NECK: no obvious masses on inspection  LUNGS: clear to auscultation bilaterally, no wheezes, rales or rhonchi, good air movement  CV: HRRR, no peripheral edema  MS: moves all extremities without noticeable abnormality  PSYCH: pleasant and cooperative, no obvious depression or anxiety  ASSESSMENT AND PLAN:  Discussed the following assessment and plan:  Dysfunction of left eustachian tube  Fluid level behind tympanic membrane of left ear  -discussed causes and treatment options -she decided to try Galbreath technique and Ear pull (OMT that facilitates movement of the ET) with reported immediate relief, improvement in hearing -also will do short course nasal decongestant, discussed risks -21 day course of intranasal steroid -Follow up for recheck in 2 weeks, sooner if  needed -Patient advised to return or notify a doctor immediately if symptoms worsen or persist or new concerns arise.  Patient Instructions  BEFORE YOU LEAVE: -follow up: in 2 weeks  Afrin nasal spray twice daily for 3 days. Do not use longer then 3 days.  Flonase 2 sprays each nostril daily for 3 weeks.  WE NOW OFFER   [facility]  FAST TRACK!!!  SAME DAY Appointments for ACUTE CARE  Such as: Sprains, Injuries, cuts, abrasions, rashes, muscle pain, joint pain, back pain Colds, flu, sore throats, headache, allergies, cough, fever  Ear pain, sinus and eye infections Abdominal pain, nausea, vomiting, diarrhea, upset stomach Animal/insect bites  3 Easy Ways to Schedule: Walk-In Scheduling Call in scheduling Mychart Sign-up: [email or web address]           Colin Benton R., DO

## 2017-02-21 NOTE — Progress Notes (Signed)
Pre visit review using our clinic review tool, if applicable. No additional management support is needed unless otherwise documented below in the visit note. 

## 2017-02-21 NOTE — Patient Instructions (Signed)
BEFORE YOU LEAVE: -follow up: in 2 weeks  Afrin nasal spray twice daily for 3 days. Do not use longer then 3 days.  Flonase 2 sprays each nostril daily for 3 weeks.  WE NOW OFFER   [facility] FAST TRACK!!!  SAME DAY Appointments for ACUTE CARE  Such as: Sprains, Injuries, cuts, abrasions, rashes, muscle pain, joint pain, back pain Colds, flu, sore throats, headache, allergies, cough, fever  Ear pain, sinus and eye infections Abdominal pain, nausea, vomiting, diarrhea, upset stomach Animal/insect bites  3 Easy Ways to Schedule: Walk-In Scheduling Call in scheduling Mychart Sign-up: [email or web address]

## 2017-03-07 ENCOUNTER — Encounter: Payer: Self-pay | Admitting: Family Medicine

## 2017-03-07 ENCOUNTER — Ambulatory Visit (INDEPENDENT_AMBULATORY_CARE_PROVIDER_SITE_OTHER): Admitting: Family Medicine

## 2017-03-07 VITALS — BP 100/70 | HR 90 | Temp 98.5°F | Ht 64.0 in | Wt 192.2 lb

## 2017-03-07 DIAGNOSIS — J302 Other seasonal allergic rhinitis: Secondary | ICD-10-CM

## 2017-03-07 DIAGNOSIS — H6982 Other specified disorders of Eustachian tube, left ear: Secondary | ICD-10-CM | POA: Diagnosis not present

## 2017-03-07 NOTE — Progress Notes (Signed)
HPI:  Follow up ear issues: -reports: doing much better, symptoms resolved -denies:hearing issues, ear pain, ear pressure -continue allergy medications Sees specialists for her diabetes and psychiatric care. Due for CPE.  ROS: See pertinent positives and negatives per HPI.  Past Medical History:  Diagnosis Date  . Allergy   . Arthritis   . Arthritis, rheumatoid ([place])   . Asthma   . Bipolar disorder ([place])   . Chicken pox   . Coronary artery disease   . Depression   . Diabetes mellitus   . Diverticulosis   . Dyspnea 04/13/2011   pfts 03/2011:  Normal FEV1 and FEV1/FVC Ct chest 2012:  No PE, normal parenchyma [name] 04/18/2011 with active symptoms:  Normal FEV1%, mild decrease in FVC due to restriction vs airtrapping, normal appearing       flow volume loop.    . Fatty liver   . Migraines   . Myocardial infarction   . Polyarthralgia - managed by Dr. Ouida Sills per her report 02/05/2013  . 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 Seasonal allergies     Past Surgical History:  Procedure Laterality Date  . ABDOMINAL HYSTERECTOMY    . APPENDECTOMY    . BREAST CYST EXCISION    . CESAREAN SECTION    . INNER EAR SURGERY     tubes in ears  . MYOMECTOMY    . TUBAL LIGATION      Family History  Problem Relation Age of Onset  . Sudden death Mother   . Kidney failure Mother   . Sudden death Father   . Allergies Sister   . Allergies Daughter   . Allergies Daughter   . Asthma Daughter   . Asthma Daughter   . Arthritis Maternal Grandmother   . Hyperlipidemia Maternal Grandmother   . Diabetes Maternal Grandmother   . Hypertension Maternal Aunt   . Mental illness    . Colon cancer Neg Hx   . Rectal cancer Neg Hx   . Stomach cancer Neg Hx   . Neuropathy Neg Hx     Social History   Social History  . Marital status: Widowed    Spouse name: N/A  . Number of children: 2  . Years of education: 16   Occupational History  . [employer]     Social History Main Topics   . Smoking status: Never Smoker  . Smokeless tobacco: Never Used  . Alcohol use No  . Drug use: No  . Sexual activity: Not Asked   Other Topics Concern  . 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      Spiritual Beliefs:       Lifestyle: MWF does kickboxing, vegetarian - trying to go vegan      Caffeine use: Drinks tea rarely                     Current Outpatient Prescriptions:  .  albuterol (PROVENTIL HFA;VENTOLIN HFA) 108 (90 Base) MCG/ACT inhaler, Inhale 1-2 puffs into the lungs every 6 (six) hours as needed for wheezing or shortness of breath., Disp: 1 Inhaler, Rfl: 0 .  busPIRone (BUSPAR) 15 MG tablet, Take 1/2 tablet by mouth 2 times daily., Disp: 90 tablet, Rfl: 1 .  cyclobenzaprine (FLEXERIL) 10 MG tablet, Take 1 tablet (10 mg total) by mouth 3 (three) times  daily as needed for muscle spasms., Disp: 90 tablet, Rfl: 3 .  FREESTYLE LITE test strip, USE TO TEST BLOOD SUGAR TWICE A DAY AS INSTRUCTED, Disp: 200 each, Rfl: 3 .  lamoTRIgine (LAMICTAL) 100 MG tablet, Take 1 tablet (100 mg total) by mouth daily., Disp: 90 tablet, Rfl: 1 .  Lancets (FREESTYLE) lancets, Use to test blood sugar 2 times daily as instructed., Disp: 200 each, Rfl: 0 .  metFORMIN (GLUCOPHAGE) 500 MG tablet, Take 1 tablet (500 mg total) by mouth 2 (two) times daily with a meal., Disp: 180 tablet, Rfl: 3 .  methocarbamol (ROBAXIN) 500 MG tablet, Take 1 tablet (500 mg total) by mouth at bedtime and may repeat dose one time if needed., Disp: 10 tablet, Rfl: 0 .  sertraline (ZOLOFT) 100 MG tablet, Take 1 tablet (100 mg total) by mouth daily., Disp: 90 tablet, Rfl: 1  EXAM:  Vitals:   03/07/17 1358  BP: 100/70  Pulse: 90  Temp: 98.5 F (36.9 C)    Body mass index is 32.99 kg/m.  GENERAL: vitals reviewed and listed above, alert, oriented, appears well hydrated and in no acute distress  HEENT:  atraumatic, conjunttiva clear, no obvious abnormalities on inspection of external nose and ears, normal appearance of ear canals and TMs, clear nasal congestion, mild post oropharyngeal erythema with PND, no tonsillar edema or exudate, no sinus TTP  NECK: no obvious masses on inspection  LUNGS: clear to auscultation bilaterally, no wheezes, rales or rhonchi, good air movement  CV: HRRR, no peripheral edema  MS: moves all extremities without noticeable abnormality  PSYCH: pleasant and cooperative, no obvious depression or anxiety  ASSESSMENT AND PLAN:  Discussed the following assessment and plan:  Acute seasonal allergic rhinitis, unspecified trigger  Dysfunction of left eustachian tube  -symptoms resolved, exam improved -advised continue allergy regimen thru may -due for CPE - agrees to schedule -Patient advised to return or notify a doctor immediately if symptoms worsen or persist or new concerns arise.  Patient Instructions  BEFORE YOU LEAVE: -follow up: Physical Exam in 2-3 months; come fasting IF convenient    Call today to set up appointment with your diabetes doctor.  Schedule mammogram.    Colin Benton R., DO

## 2017-03-07 NOTE — Progress Notes (Signed)
Pre visit review using our clinic review tool, if applicable. No additional management support is needed unless otherwise documented below in the visit note. 

## 2017-03-07 NOTE — Patient Instructions (Signed)
BEFORE YOU LEAVE: -follow up: Physical Exam in 2-3 months; come fasting IF convenient    Call today to set up appointment with your diabetes doctor.  Schedule mammogram.

## 2017-04-23 ENCOUNTER — Emergency Department (HOSPITAL_COMMUNITY)
Admission: EM | Admit: 2017-04-23 | Discharge: 2017-04-24 | Disposition: A | Discharge status: Home / Self Care | Attending: Emergency Medicine | Admitting: Emergency Medicine

## 2017-04-23 ENCOUNTER — Encounter (HOSPITAL_COMMUNITY): Payer: Self-pay | Admitting: Emergency Medicine

## 2017-04-23 DIAGNOSIS — E1165 Type 2 diabetes mellitus with hyperglycemia: Secondary | ICD-10-CM | POA: Insufficient documentation

## 2017-04-23 DIAGNOSIS — J45909 Unspecified asthma, uncomplicated: Secondary | ICD-10-CM | POA: Diagnosis not present

## 2017-04-23 DIAGNOSIS — I252 Old myocardial infarction: Secondary | ICD-10-CM | POA: Insufficient documentation

## 2017-04-23 DIAGNOSIS — Z79899 Other long term (current) drug therapy: Secondary | ICD-10-CM | POA: Insufficient documentation

## 2017-04-23 DIAGNOSIS — I2510 Atherosclerotic heart disease of native coronary artery without angina pectoris: Secondary | ICD-10-CM | POA: Diagnosis not present

## 2017-04-23 DIAGNOSIS — R739 Hyperglycemia, unspecified: Secondary | ICD-10-CM

## 2017-04-23 DIAGNOSIS — Z7984 Long term (current) use of oral hypoglycemic drugs: Secondary | ICD-10-CM | POA: Insufficient documentation

## 2017-04-23 LAB — CBC
HEMATOCRIT: 39.6 % (ref 36.0–46.0)
Hemoglobin: 13.1 g/dL (ref 12.0–15.0)
MCH: 27.5 pg (ref 26.0–34.0)
MCHC: 33.1 g/dL (ref 30.0–36.0)
MCV: 83 fL (ref 78.0–100.0)
PLATELETS: 173 10*3/uL (ref 150–400)
RBC: 4.77 MIL/uL (ref 3.87–5.11)
RDW: 12.6 % (ref 11.5–15.5)
WBC: 10.5 10*3/uL (ref 4.0–10.5)

## 2017-04-23 LAB — URINALYSIS, ROUTINE W REFLEX MICROSCOPIC
BILIRUBIN URINE: NEGATIVE
Bacteria, UA: NONE SEEN
KETONES UR: NEGATIVE mg/dL
LEUKOCYTES UA: NEGATIVE
NITRITE: NEGATIVE
PH: 5 (ref 5.0–8.0)
Protein, ur: NEGATIVE mg/dL
Specific Gravity, Urine: 1.033 — ABNORMAL HIGH (ref 1.005–1.030)

## 2017-04-23 LAB — BASIC METABOLIC PANEL
Anion gap: 7 (ref 5–15)
BUN: 22 mg/dL — AB (ref 6–20)
CO2: 26 mmol/L (ref 22–32)
CREATININE: 0.64 mg/dL (ref 0.44–1.00)
Calcium: 9 mg/dL (ref 8.9–10.3)
Chloride: 104 mmol/L (ref 101–111)
GFR calc Af Amer: >60 mL/min (ref >60)
Glucose, Bld: 362 mg/dL — ABNORMAL HIGH (ref 65–99)
POTASSIUM: 3.6 mmol/L (ref 3.5–5.1)
SODIUM: 137 mmol/L (ref 135–145)

## 2017-04-23 LAB — CBG MONITORING, ED
Glucose-Capillary: 369 mg/dL — ABNORMAL HIGH (ref 65–99)
Glucose-Capillary: 322 mg/dL — ABNORMAL HIGH (ref 65–99)
Glucose-Capillary: 274 mg/dL — ABNORMAL HIGH (ref 65–99)

## 2017-04-23 MED ORDER — SODIUM CHLORIDE 0.9 % IV BOLUS (SEPSIS)
1000.0000 mL | Freq: Once | INTRAVENOUS | Status: AC
  Administered 2017-04-23: 1000 mL via INTRAVENOUS

## 2017-04-23 MED ORDER — METFORMIN HCL 500 MG PO TABS
500.0000 mg | ORAL_TABLET | Freq: Once | ORAL | Status: AC
  Administered 2017-04-23: 500 mg via ORAL
  Filled 2017-04-23: qty 1

## 2017-04-23 MED ORDER — METFORMIN HCL 500 MG PO TABS: 500.0000 mg | ORAL_TABLET | Freq: Two times a day (BID) | ORAL | 0 refills | Status: DC

## 2017-04-23 NOTE — Discharge Instructions (Signed)
Please begin taking the metformin again tomorrow morning. Follow up with your endocrinologist as soon as possible.

## 2017-04-23 NOTE — ED Notes (Signed)
Pt c/o frequent urination, lethargy and thirst. Glucose was checked to be 367 mg/dl. Pt has been off her metformin because she has been unable to afford.

## 2017-04-23 NOTE — ED Triage Notes (Signed)
Pt comes with complaints of hyperglycemia after not being able to afford her metformin.  Hasn't had it in 1+ years.  States she was trying to manage it with diet and it is just not working anymore.  Endorses increased urinary frequency and increase in thirst. A&O x4.  Ambulatory.

## 2017-04-23 NOTE — ED Provider Notes (Signed)
[name] DEPT Provider Note   CSN: [id number] Arrival date & time: 04/23/17  1940     History   Chief Complaint Chief Complaint  Patient presents with  . Hyperglycemia    HPI Kerri Osborn is a 56 y.o. female.  HPI   Kerri Osborn is a 56 y.o. female, with a history of DM, depression, CAD, MI, and Asthma, presenting to the ED with polyuria, polydipsia, and hyperglycemia noted over the last two weeks. Glucose 437 at home. Presented this evening because she was unable to get an appointment with Dr. Renne Crigler. Last saw Dr. Renne Crigler over a year ago. Was previously on metformin for DM with last use over a year ago. Stopped using it due to financial reasons. Has tried to control it with diet. States she now has a new job and could now afford to restart metformin.  Denies N/V/D, fever/chills, SOB, CP, dizziness, or any other complaints.   Last A1C was 8.2 on 08/01/16.    Past Medical History:  Diagnosis Date  . Allergy   . Arthritis   . Arthritis, rheumatoid ([place])   . Asthma   . Bipolar disorder ([place])   . Chicken pox   . Coronary artery disease   . Depression   . Diabetes mellitus   . Diverticulosis   . Dyspnea 04/13/2011   pfts 03/2011:  Normal FEV1 and FEV1/FVC Ct chest 2012:  No PE, normal parenchyma Kerri Osborn 04/18/2011 with active symptoms:  Normal FEV1%, mild decrease in FVC due to restriction vs airtrapping, normal appearing       flow volume loop.    . Fatty liver   . Migraines   . Myocardial infarction ([place])   . Polyarthralgia - managed by Dr. Ouida Sills per her report 02/05/2013  . 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 Seasonal allergies     Patient Active Problem List   Diagnosis Date Noted  . Type 2 diabetes mellitus with hyperglycemia, with long-term current use of insulin ([place]) 05/02/2016  . Hyperlipidemia 04/14/2016  . Obesity 04/14/2016  . Bipolar affective disorder ([place]) 02/05/2013  . Polyarthralgia - managed by Dr. Ouida Sills per her report  02/05/2013    Past Surgical History:  Procedure Laterality Date  . ABDOMINAL HYSTERECTOMY    . APPENDECTOMY    . BREAST CYST EXCISION    . CESAREAN SECTION    . INNER EAR SURGERY     tubes in ears  . MYOMECTOMY    . TUBAL LIGATION      OB History    No data available       Home Medications    Prior to Admission medications   Medication Sig Start Date End Date Taking? Authorizing Provider  albuterol (PROVENTIL HFA;VENTOLIN HFA) 108 (90 Base) MCG/ACT inhaler Inhale 1-2 puffs into the lungs every 6 (six) hours as needed for wheezing or shortness of breath. 01/05/17  Yes Recardo Evangelist, PA-C  busPIRone (BUSPAR) 15 MG tablet Take 1/2 tablet by mouth 2 times daily. 11/17/14  Yes Philemon Kingdom, MD  Coenzyme Q10 (CO Q 10 PO) Take 1 capsule by mouth 2 (two) times daily.   Yes [provider]  cyclobenzaprine (FLEXERIL) 10 MG tablet Take 1 tablet (10 mg total) by mouth 3 (three) times daily as needed for muscle spasms. 06/08/16  Yes Kerri Beam, MD  FREESTYLE LITE test strip USE TO TEST BLOOD SUGAR TWICE A DAY AS INSTRUCTED 01/26/16  Yes Philemon Kingdom, MD  lamoTRIgine (  LAMICTAL) 100 MG tablet Take 1 tablet (100 mg total) by mouth daily. 11/17/14  Yes Philemon Kingdom, MD  Lancets (FREESTYLE) lancets Use to test blood sugar 2 times daily as instructed. 10/29/15  Yes Philemon Kingdom, MD  naproxen sodium (ANAPROX) 220 MG tablet Take 440 mg by mouth 2 (two) times daily with a meal.   Yes [provider]  sertraline (ZOLOFT) 100 MG tablet Take 1 tablet (100 mg total) by mouth daily. 11/17/14  Yes Philemon Kingdom, MD  metFORMIN (GLUCOPHAGE) 500 MG tablet Take 1 tablet (500 mg total) by mouth 2 (two) times daily with a meal. Patient not taking: Reported on 04/23/2017 08/02/16   Philemon Kingdom, MD  metFORMIN (GLUCOPHAGE) 500 MG tablet Take 1 tablet (500 mg total) by mouth 2 (two) times daily with a meal. 04/23/17 05/23/17  Kerri Osborn C, PA-C  methocarbamol (ROBAXIN)  500 MG tablet Take 1 tablet (500 mg total) by mouth at bedtime and may repeat dose one time if needed. Patient not taking: Reported on 04/23/2017 11/19/16   Recardo Evangelist, PA-C    Family History Family History  Problem Relation Age of Onset  . Sudden death Mother   . Kidney failure Mother   . Sudden death Father   . Allergies Sister   . Allergies Daughter   . Allergies Daughter   . Asthma Daughter   . Asthma Daughter   . Arthritis Maternal Grandmother   . Hyperlipidemia Maternal Grandmother   . Diabetes Maternal Grandmother   . Hypertension Maternal Aunt   . Mental illness    . Colon cancer Neg Hx   . Rectal cancer Neg Hx   . Stomach cancer Neg Hx   . Neuropathy Neg Hx     Social History Social History  Substance Use Topics  . Smoking status: Never Smoker  . Smokeless tobacco: Never Used  . Alcohol use No     Allergies   Amoxicillin   Review of Systems Review of Systems  Constitutional: Positive for fatigue. Negative for chills and fever.  Respiratory: Negative for shortness of breath.   Cardiovascular: Negative for chest pain.  Gastrointestinal: Negative for abdominal pain, constipation, diarrhea, nausea and vomiting.  Endocrine: Positive for polydipsia and polyuria.  All other systems reviewed and are negative.    Physical Exam Updated Vital Signs BP (!) 169/90   Pulse 65   Temp 99 F (37.2 C) (Oral)   Resp 11   Ht 5\' 3"  (1.6 m)   Wt 90.2 kg   SpO2 96%   BMI 35.23 kg/m   Physical Exam  Constitutional: She appears well-developed and well-nourished. No distress.  HENT:  Head: Normocephalic and atraumatic.  Eyes: Conjunctivae are normal.  Neck: Neck supple.  Cardiovascular: Normal rate, regular rhythm, normal heart sounds and intact distal pulses.   Pulmonary/Chest: Effort normal and breath sounds normal. No respiratory distress.  Abdominal: Soft. There is no tenderness. There is no guarding.  Musculoskeletal: She exhibits no edema.    Lymphadenopathy:    She has no cervical adenopathy.  Neurological: She is alert.  Skin: Skin is warm and dry. She is not diaphoretic.  Psychiatric: She has a normal mood and affect. Her behavior is normal.  Nursing note and vitals reviewed.    ED Treatments / Results  Labs (all labs ordered are listed, but only abnormal results are displayed) Labs Reviewed  BASIC METABOLIC PANEL - Abnormal; Notable for the following:       Result Value   Glucose,  Bld 362 (*)    BUN 22 (*)    All other components within normal limits  URINALYSIS, ROUTINE W REFLEX MICROSCOPIC - Abnormal; Notable for the following:    Color, Urine STRAW (*)    Specific Gravity, Urine 1.033 (*)    Glucose, UA >=500 (*)    Hgb urine dipstick MODERATE (*)    Squamous Epithelial / LPF 0-5 (*)    All other components within normal limits  CBG MONITORING, ED - Abnormal; Notable for the following:    Glucose-Capillary 322 (*)    All other components within normal limits  CBG MONITORING, ED - Abnormal; Notable for the following:    Glucose-Capillary 369 (*)    All other components within normal limits  CBG MONITORING, ED - Abnormal; Notable for the following:    Glucose-Capillary 274 (*)    All other components within normal limits  CBC    EKG  EKG Interpretation None       Radiology No results found.  Procedures Procedures (including critical care time)  Medications Ordered in ED Medications  sodium chloride 0.9 % bolus 1,000 mL (1,000 mLs Intravenous New Bag/Given 04/23/17 2100)    Followed by  sodium chloride 0.9 % bolus 1,000 mL (0 mLs Intravenous Stopped 04/23/17 2240)  metFORMIN (GLUCOPHAGE) tablet 500 mg (500 mg Oral Given 04/23/17 2206)     Initial Impression / Assessment and Plan / ED Course  I have reviewed the triage vital signs and the nursing notes.  Pertinent labs & imaging results that were available during my care of the patient were reviewed by me and considered in my medical decision  making (see chart for details).      Patient presents with hyperglycemia without anion gap. Patient is nontoxic appearing, afebrile, not tachycardic, not tachypneic, not hypotensive, maintains adequate SPO2 on room air, and is in no apparent distress. Doubt DKA. Case management consult was placed to assist patient with obtaining medications. IV fluids, reinitiate metformin, endocrine follow-up. Return precautions discussed. Patient voices understanding of all instructions and is comfortable with discharge.   Findings and plan of care discussed with Nanda Quinton, MD.    Vitals:   04/23/17 2100 04/23/17 2130 04/23/17 2132 04/23/17 2230  BP: (!) 154/92 (!) 143/93 (!) 143/93 (!) 170/92  Pulse: 71 70 70 66  Resp: 19 19 18 18   Temp:      TempSrc:      SpO2: 98% 100% 99% 96%  Weight:      Height:         Final Clinical Impressions(s) / ED Diagnoses   Final diagnoses:  Hyperglycemia    New Prescriptions New Prescriptions   METFORMIN (GLUCOPHAGE) 500 MG TABLET    Take 1 tablet (500 mg total) by mouth 2 (two) times daily with a meal.     Lorayne Bender, PA-C 04/23/17 2252    Margette Fast, MD 04/24/17 1202

## 2017-04-24 ENCOUNTER — Telehealth: Payer: Self-pay | Admitting: Surgery

## 2017-04-24 ENCOUNTER — Telehealth: Payer: Self-pay | Admitting: Internal Medicine

## 2017-04-24 NOTE — Telephone Encounter (Signed)
Please advise when you would like to see her. Thank you!

## 2017-04-24 NOTE — Telephone Encounter (Signed)
I can see her at 8:30 next Thursday (17th). Until then, take insulin without missing any doses and write sugars before meals and at bedtime down.

## 2017-04-24 NOTE — Telephone Encounter (Signed)
ED CM received consult concerning patient needing assistance with medications. CM reviewed record and contacted patient by phone verified patient. Patient reports not being able to afford metformin. CM discussed [facility] Metformin pricing of $4 with prescription, patient states, she was unaware, she does not have an issue affording the $4. No further ED CM needs identified.

## 2017-04-24 NOTE — Telephone Encounter (Signed)
Kerri Osborn Self [phone]  [facility] -- [place] stopped by to get an appointment with Doctor as soon as possible for a Hospital fu -- Blood Sugars 437  She also needs a prescription for test strips.

## 2017-04-25 ENCOUNTER — Telehealth: Payer: Self-pay

## 2017-04-25 MED ORDER — GLUCOSE BLOOD VI STRP: ORAL_STRIP | 3 refills | Status: DC

## 2017-04-25 NOTE — Telephone Encounter (Signed)
Called and spoke to patient, she can come on the 17th.  [facility] can you please make a spot for this patient on the 17th at 8:30. Thank you!

## 2017-04-25 NOTE — Telephone Encounter (Signed)
Called and spoke to patient, she is able to come to the appointment on the 17th. Patient states she has not been taking insulin in over a year because she could not afford any medications. She finally received free metformin and that's all she has been taking right now.

## 2017-04-25 NOTE — Telephone Encounter (Signed)
Done

## 2017-04-27 ENCOUNTER — Other Ambulatory Visit: Payer: Self-pay

## 2017-04-27 MED ORDER — ONETOUCH ULTRA MINI W/DEVICE KIT: PACK | 0 refills | Status: AC

## 2017-04-27 MED ORDER — GLUCOSE BLOOD VI STRP: ORAL_STRIP | 5 refills | Status: DC

## 2017-04-27 MED ORDER — ONETOUCH ULTRASOFT LANCETS MISC: 5 refills | Status: DC

## 2017-04-28 ENCOUNTER — Other Ambulatory Visit: Payer: Self-pay

## 2017-04-28 MED ORDER — FREESTYLE LANCETS MISC: 5 refills | Status: AC

## 2017-04-28 MED ORDER — GLUCOSE BLOOD VI STRP: ORAL_STRIP | 5 refills | Status: AC

## 2017-04-28 NOTE — Telephone Encounter (Signed)
Called patient, advised that the onetouch was not covered by insurance. We had to go back to freestyle which is preferred. I advised her to ask why it was that expensive when it is their preferred product. I also advised patient I would leave some RX's coupon cards up front for patient to pick up to maybe help the cost be less. If that did not work, I would send the other preferred product in for the Precision. Patient understood, and had no questions at this time.

## 2017-05-04 ENCOUNTER — Ambulatory Visit (INDEPENDENT_AMBULATORY_CARE_PROVIDER_SITE_OTHER): Admitting: Internal Medicine

## 2017-05-04 ENCOUNTER — Encounter: Payer: Self-pay | Admitting: Internal Medicine

## 2017-05-04 ENCOUNTER — Telehealth: Payer: Self-pay | Admitting: Internal Medicine

## 2017-05-04 VITALS — BP 128/84 | HR 76 | Ht 65.0 in | Wt 196.0 lb

## 2017-05-04 DIAGNOSIS — Z794 Long term (current) use of insulin: Secondary | ICD-10-CM | POA: Diagnosis not present

## 2017-05-04 DIAGNOSIS — E1165 Type 2 diabetes mellitus with hyperglycemia: Secondary | ICD-10-CM

## 2017-05-04 LAB — POCT GLYCOSYLATED HEMOGLOBIN (HGB A1C): Hemoglobin A1C: 10.9

## 2017-05-04 MED ORDER — DULAGLUTIDE 0.75 MG/0.5ML ~~LOC~~ SOAJ: SUBCUTANEOUS | 1 refills | Status: DC

## 2017-05-04 MED ORDER — METFORMIN HCL 1000 MG PO TABS: 1000.0000 mg | ORAL_TABLET | Freq: Two times a day (BID) | ORAL | 3 refills | Status: DC

## 2017-05-04 NOTE — Telephone Encounter (Signed)
Will await PA form

## 2017-05-04 NOTE — Progress Notes (Signed)
Subjective:     Patient ID: Kerri Osborn, female   DOB: November 25, 1961, 56 y.o.   MRN: [id number]  HPI Kerri Osborn is a 56 y.o. woman, returning for f/u for DM2, dx. 7902, without complications, uncontrolled, insulin-dependent since 2012. Last visit 9 months ago!  Her last A1c was: Lab Results  Component Value Date   HGBA1C 8.2 (H) 08/01/2016   HGBA1C 7.6 (H) 02/26/2016   HGBA1C 8.9 11/17/2015   Previous treatment regimen: - VGo 20 + 3 clicks per meal - since last year >> bruising and numbness >> would like switch to injections - JanuMet 50-1000 mg daily bid She wanted to come off the VGo as it caused skin changes. - Lantus 24 >> 26 >> 20 units at bedtime  - NovoLog 6 >> 10 >> 5 units 15 min before every meal - 3x a day - Sliding scale of NovoLog - does not need to use it lately: - 150-175: + 1 unit  - 176-200: + 2 units  - 201-225: + 3 units  - >225: + 4 units  She was out of Janumet 50-1000 mg 2x a day - for 2 months 2/2 insurance coverage.  She stopped all insulin for >1 year. Restarted Metformin recently. She takes: - Metformin 500 mg 2x a day - started 04/24/2017  She just started checking sugars again >> checks 1x a day in am now: - am: 1 65-113 >> end of her day (works nights): 133-267 >> 223-229 - 2h after b'fast: n/c >> highest 165 (pancakes) >> n/c - before lunch: 140-160 >> n/c - 2h after lunch: n/c >> 108-120 >> n/c - before dinner: 100-103 >> 200s >> (waking up)  65-80s >> n/c - bedtime: 140-150s >> n/c She has hypoglycemia awareness at 60. Lowest 65.  She was vegan but was advised by GI to switch to regular diet (diverticulosis on colonoscopy).   - Last eye appt: 02/2015. No DR.  - No history of chronic kidney disease: Lab Results  Component Value Date   BUN 22 (H) 04/23/2017   BUN 16 01/05/2017   Lab Results  Component Value Date   CREATININE 0.64 04/23/2017   CREATININE 0.52 01/05/2017  She is not on an ACE inhibitor/ARB. Last ACR: Lab Results   Component Value Date   MICRALBCREAT 11.1 08/01/2016   MICRALBCREAT 23.1 06/25/2015   MICRALBCREAT 7.9 04/15/2014   MICRALBCREAT 8.1 02/05/2013   - She does have hyperlipidemia Lab Results  Component Value Date   CHOL 253 (H) 08/01/2016   HDL 58.60 08/01/2016   LDLCALC 175 (H) 08/01/2016   LDLDIRECT 146.2 02/05/2013   TRIG 94.0 08/01/2016   CHOLHDL 4 08/01/2016  She is not compliant with Lipitor treatment. No numbness tingling in her feetines, and asthma. Also has RA by Dr. Ouida Sills (rheum.).  Husband died in 2015-09-11 (hemorrhagic stroke).  Review of Systems Constitutional: + weight loss, no fatigue, no subjective hyperthermia, no subjective hypothermia, + nocturia  Eyes: + blurry vision, no xerophthalmia ENT: no sore throat, no nodules palpated in throat, no dysphagia, no odynophagia, no hoarseness Cardiovascular: no CP/no SOB/no palpitations/no leg swelling Respiratory: no cough/no SOB/no wheezing Gastrointestinal: no N/no V/no D/no C/no acid reflux Musculoskeletal: no muscle aches/no joint aches Skin: no rashes, no hair loss Neurological: no tremors/no numbness/no tingling/no dizziness  I reviewed pt's medications, allergies, PMH, social hx, family hx, and changes were documented in the history of present illness. Otherwise, unchanged from my initial visit note.  Objective:   Physical Exam  BP 128/84 (BP Location: Left Arm, Patient Position: Sitting)   Pulse 76   Ht 5\' 5"  (1.651 m)   Wt 196 lb (88.9 kg)   SpO2 96%   BMI 32.62 kg/m  Body mass index is 32.62 kg/m. Wt Readings from Last 3 Encounters:  05/04/17 196 lb (88.9 kg)  04/23/17 198 lb 14.4 oz (90.2 kg)  03/07/17 192 lb 3.2 oz (87.2 kg)   Constitutional: overweight, in NAD Eyes: PERRLA, EOMI, no exophthalmos ENT: moist mucous membranes, no thyromegaly, no cervical lymphadenopathy Cardiovascular: RRR, No MRG Respiratory: CTA B Gastrointestinal: abdomen soft, NT, ND, BS+ Musculoskeletal: no deformities,  strength intact in all 4 Skin: moist, warm, no rashes Neurological: no tremor with outstretched hands, DTR normal in all 4  Assessment:     1. DM2, without long complications, uncontrolled, insulin-dependent, with Hyperglycemia    Plan:     1. DM2 - Patient with long standing DM2, returning after a long absence. Since last visit, she stopped all insulin and also metformin, since she did not feel that her efforts towards diabetes care were appreciated. She mentions that she got a call from the nurse with her A1c and she was told that she needs to improve her diabetes control. She fell that she already was doing enough, so at that point, she stopped checking her sugars, taking her medicines and stopped coming to the appointments. - At this visit, we reviewed together her HbA1c levels. They were much better last year, even 7.6% at during the summer. Today, HbA1c is 10.9%, so I explained that definitely her efforts were working. She is determined to restart back on diabetes medicines, but she would like to try without insulin. She is now on half maximal metformin dose, which we will increase to 1000 mg twice a day. We will also try a GLP-1 receptor agonist, Trulicity, to hopefully be able to avoid mealtime insulin. We may be able to try an SGLT2 inhibitor if the above is not covered. - I also advised her to start walking again - I advised her to:  Patient Instructions  Please increase Metformin to 1000 mg 2x a day.  Please start Trulicity 6.46 mg weekly. Let me know when you are close to running out to call in the higher dose to your pharmacy (1.5 mg).  Please come back for a follow-up appointment in 3 months.  - start checking sugars at different times of the day - check 1x a day, rotating checks - advised for yearly eye exams >> she needs one  - Return to clinic in 3 mo with sugar log   Philemon Kingdom, MD PhD [facility] Endocrinology

## 2017-05-04 NOTE — Patient Instructions (Signed)
Please increase Metformin to 1000 mg 2x a day.  Please start Trulicity 6.85 mg weekly. Let me know when you are close to running out to call in the higher dose to your pharmacy (1.5 mg).  Please come back for a follow-up appointment in 3 months.

## 2017-05-04 NOTE — Telephone Encounter (Signed)
Patient need a PA on medication Dulaglutide (TRULICITY) 1.49 FW/2.6VZ SOPN  Pharmacy will be faxing over form.

## 2017-05-08 ENCOUNTER — Other Ambulatory Visit: Payer: Self-pay

## 2017-05-08 MED ORDER — ALBIGLUTIDE 30 MG ~~LOC~~ PEN: PEN_INJECTOR | SUBCUTANEOUS | 2 refills | Status: DC

## 2017-05-10 ENCOUNTER — Other Ambulatory Visit: Payer: Self-pay

## 2017-05-10 MED ORDER — EXENATIDE ER 2 MG ~~LOC~~ PEN: PEN_INJECTOR | SUBCUTANEOUS | 2 refills | Status: DC

## 2017-05-11 ENCOUNTER — Encounter: Payer: Self-pay | Admitting: Dietician

## 2017-05-11 ENCOUNTER — Encounter: Attending: Internal Medicine | Admitting: Dietician

## 2017-05-11 DIAGNOSIS — E1165 Type 2 diabetes mellitus with hyperglycemia: Secondary | ICD-10-CM | POA: Insufficient documentation

## 2017-05-11 DIAGNOSIS — Z713 Dietary counseling and surveillance: Secondary | ICD-10-CM | POA: Insufficient documentation

## 2017-05-11 DIAGNOSIS — Z6832 Body mass index (BMI) 32.0-32.9, adult: Secondary | ICD-10-CM | POA: Insufficient documentation

## 2017-05-11 DIAGNOSIS — Z794 Long term (current) use of insulin: Secondary | ICD-10-CM

## 2017-05-11 NOTE — Progress Notes (Signed)
Diabetes Self-Management Education  Visit Type: First/Initial  Appt. Start Time: 0950 Appt. End Time: 1130  05/11/2017  [name], identified by name and date of birth, is a 56 y.o. female with a diagnosis of Diabetes: Type 2. Other hx includes bipolar disorder and CAD.  She states that she tried very hard to control her diabetes and A1C dropped but became discouraged and quit medications.  She was vegan in (2015-2016) but was told that she needed to eat meat after a colonoscopy because her blood sugar dropped.  She wants to gain increased knowledge and be proactive in her care.    Medications include Metformin.  Trulicity was denied by her insurance and is working to have Tanzeum or other comparable drug pre authorized.  She is currently not on insulin.  Patient's 15 yo twin daughters live with her and do most of the cooking.  They are very conscience of her health needs.  Her husband died 09/14/15.  She is currently working on PhD in psychology and just started a job at [employer].  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  ASSESSMENT  Height 5\' 5"  (1.651 m), weight 194 lb (88 kg). Body mass index is 32.28 kg/m.  Weight decreased from 197 lbs in the past week.  Lowest weight was 130 lbs 20 years ago.      Diabetes Self-Management Education - 05/11/17 1023      Visit Information   Visit Type First/Initial     Initial Visit   Diabetes Type Type 2   Are you currently following a meal plan? No   Are you taking your medications as prescribed? Yes  but some are not being used used as they need preautherization   Date Diagnosed Type 2 Diabetes 2008, and was on insulin in 2012 but not currently.     Health Coping   How would you rate your overall health? Good     Psychosocial Assessment   Patient Belief/Attitude about Diabetes Motivated to manage diabetes   Self-care barriers None   Self-management support Doctor's office;Family   Other persons  present Patient   Patient Concerns Nutrition/Meal planning;Glycemic Control   Special Needs None   Preferred Learning Style No preference indicated   Learning Readiness Ready   How often do you need to have someone help you when you read instructions, pamphlets, or other written materials from your doctor or pharmacy? 1 - Never   What is the last grade level you completed in school? currently getting PhD in Pyschology     Pre-Education Assessment   Patient understands the diabetes disease and treatment process. Needs Review   Patient understands incorporating nutritional management into lifestyle. Needs Review   Patient undertands incorporating physical activity into lifestyle. Needs Review   Patient understands using medications safely. Needs Review   Patient understands monitoring blood glucose, interpreting and using results Needs Review   Patient understands prevention, detection, and treatment of acute complications. Needs Review   Patient understands prevention, detection, and treatment of chronic complications. Needs Review   Patient understands how to develop strategies to address psychosocial issues. Needs Review   Patient understands how to develop strategies to promote health/change behavior. Needs Review     Complications   Last HgB A1C per patient/outside source 10.9 %  05/04/17 increased from 7.6%   Fasting Blood glucose range (mg/dL) 130-179;70-129     Dietary Intake   Breakfast toast, 2 boiled eggs  7 am  Snack (morning) none   Lunch salad with chicken or fish  1   Snack (afternoon) caroots, peanut butter   Dinner boiled chicken, 1 cup beans  6-7   Snack (evening) none   Beverage(s) water     Exercise   Exercise Type Light (walking / raking leaves)   How many days per week to you exercise? 6   How many minutes per day do you exercise? 30   Total minutes per week of exercise 180     Patient Education   Previous Diabetes Education Yes (please comment)    Disease state  Other (comment)  Review of insulin resistance   Nutrition management  Role of diet in the treatment of diabetes and the relationship between the three main macronutrients and blood glucose level;Food label reading, portion sizes and measuring food.;Meal options for control of blood glucose level and chronic complications.   Physical activity and exercise  Role of exercise on diabetes management, blood pressure control and cardiac health.   Medications Reviewed patients medication for diabetes, action, purpose, timing of dose and side effects.   Monitoring Purpose and frequency of SMBG.;Identified appropriate SMBG and/or A1C goals.   Chronic complications Relationship between chronic complications and blood glucose control   Psychosocial adjustment Worked with patient to identify barriers to care and solutions;Role of stress on diabetes;Identified and addressed patients feelings and concerns about diabetes;Brainstormed with patient on coping mechanisms for social situations, getting support from significant others, dealing with feelings about diabetes   Personal strategies to promote health Lifestyle issues that need to be addressed for better diabetes care     Individualized Goals (developed by patient)   Nutrition General guidelines for healthy choices and portions discussed;Other (comment)  increased plant based, whole foods   Physical Activity Exercise 5-7 days per week;30 minutes per day   Medications take my medication as prescribed   Monitoring  test my blood glucose as discussed   Problem Solving meal planning   Reducing Risk examine blood glucose patterns   Health Coping discuss diabetes with (comment)  MD/RD     Post-Education Assessment   Patient understands the diabetes disease and treatment process. Demonstrates understanding / competency   Patient understands incorporating nutritional management into lifestyle. Demonstrates understanding / competency   Patient  undertands incorporating physical activity into lifestyle. Demonstrates understanding / competency   Patient understands using medications safely. Demonstrates understanding / competency   Patient understands monitoring blood glucose, interpreting and using results Demonstrates understanding / competency   Patient understands prevention, detection, and treatment of acute complications. Demonstrates understanding / competency   Patient understands prevention, detection, and treatment of chronic complications. Demonstrates understanding / competency   Patient understands how to develop strategies to address psychosocial issues. Demonstrates understanding / competency   Patient understands how to develop strategies to promote health/change behavior. Demonstrates understanding / competency     Outcomes   Expected Outcomes Demonstrated interest in learning. Expect positive outcomes   Future DMSE PRN   Program Status Completed      Individualized Plan for Diabetes Self-Management Training:   Learning Objective:  Patient will have a greater understanding of diabetes self-management. Patient education plan is to attend individual and/or group sessions per assessed needs and concerns.   Plan:   Patient Instructions  Continue to stay active most days of the week. Be sure to take your medication as prescribed.  Discuss with your MD about any problems with this. Continue to increase your non-starchy vegetable intake. Have  a protein with each meal (lentils and other legumes, tofu, etc.) Be sure to have breakfast, lunch, and dinner daily.  Great job on the changes that you have made!   Expected Outcomes:  Demonstrated interest in learning. Expect positive outcomes  Education material provided: Living Well with Diabetes, Food label handouts, Meal plan card and My Plate, Plant-based Primer (She has read Dr. Emilio Math reversing diabetes book and suggested other books by him or Calton Golds, RD.)  If  problems or questions, patient to contact team via:  Phone  Future DSME appointment: PRN

## 2017-05-11 NOTE — Patient Instructions (Signed)
Continue to stay active most days of the week. Be sure to take your medication as prescribed.  Discuss with your MD about any problems with this. Continue to increase your non-starchy vegetable intake. Have a protein with each meal (lentils and other legumes, tofu, etc.) Be sure to have breakfast, lunch, and dinner daily.  Great job on the changes that you have made!

## 2017-05-18 NOTE — Progress Notes (Signed)
HPI:  Here for CPE: Due for: hep c, eye exam, mammo, lipid -Concerns and/or follow up today:   DM type 2, uncontrolled, with neuropathy ([place]) -managed by endo - looks like poor pt compliance on chart review, poor control - per endo notes GI told her to switch back to regular diet from vegan diet - I do not find that in GI notes I see instead that a high fiber diet was advised -meds: insulin, she had stopped her metformin on her own -denies vision changes, wounds, hypglycemia -reports now back on vegan diet and LOVES her nutritionist  Hyperlipemia/Obesity -meds: poor compliance with recommended tx in the past -advised to replace coconut oil with good fats and take statin in the past -back on vegan diet and walking 2 miles per day   Polyarthralgia: -sees specialist -meds: gabapentin, percocet, lamital, hydroxychloroquine in the past  Bipolar disorder, in partial remission, most recent episode mixed ([place]) -Managed by psychiatrist - [facility] -meds: lamictal,  and zoloft -report mood is good  Chronic low back pain and leg pain: -saw Dr. Leonarda Salon in the past, saw Dr. Jaynee Eagles in 2017 with repeat MRI, referred to Dr. Nelva Bush [id number]  -Taking folic acid, vitamin D or calcium: no  -Diabetes and Dyslipidemia Screening: fasting for labs  -Vaccines: UTD/refused flu  -pap history: n/a - s/p complete hysterectomy per her report  -sexual activity: yes, female partner, no new partners  -wants STI testing (Hep C if born [birth year]): no to STI testing, agrees to hep c screen for baby boomer  -FH breast, colon or ovarian ca: see FH Last mammogram: agrees to call herself and schedule Last colon cancer screening: done  -Alcohol, Tobacco, drug use: see social history  Review of Systems - no fevers, unintentional weight loss, vision loss, hearing loss, chest pain, sob, hemoptysis, melena, hematochezia, hematuria, genital discharge, changing or concerning skin lesions, bleeding, bruising, loc, thoughts  of self harm or SI  Past Medical History:  Diagnosis Date  . Allergy   . Arthritis   . Arthritis, rheumatoid ([place])   . Asthma   . Bipolar disorder ([place])   . Chicken pox   . Coronary artery disease   . Depression   . Diabetes mellitus   . Diverticulosis   . Dyspnea 04/13/2011   pfts 03/2011:  Normal FEV1 and FEV1/FVC Ct chest 2012:  No PE, normal parenchyma [name] 04/18/2011 with active symptoms:  Normal FEV1%, mild decrease in FVC due to restriction vs airtrapping, normal appearing       flow volume loop.    . Fatty liver   . Migraines   . Myocardial infarction ([place])   . Polyarthralgia - managed by Dr. Ouida Sills per her report 02/05/2013  . 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 Seasonal allergies     Past Surgical History:  Procedure Laterality Date  . ABDOMINAL HYSTERECTOMY    . APPENDECTOMY    . BREAST CYST EXCISION    . CESAREAN SECTION    . INNER EAR SURGERY     tubes in ears  . MYOMECTOMY    . TUBAL LIGATION      Family History  Problem Relation Age of Onset  . Sudden death Mother   . Kidney failure Mother   . Sudden death Father   . Allergies Sister   . Allergies Daughter   . Allergies Daughter   . Asthma Daughter   . Asthma Daughter   . Arthritis Maternal Grandmother   .  Hyperlipidemia Maternal Grandmother   . Diabetes Maternal Grandmother   . Hypertension Maternal Aunt   . Mental illness Unknown   . Colon cancer Neg Hx   . Rectal cancer Neg Hx   . Stomach cancer Neg Hx   . Neuropathy Neg Hx     Social History   Social History  . Marital status: Widowed    Spouse name: N/A  . Number of children: 2  . Years of education: 16   Occupational History  . [employer]     Social History Main Topics  . Smoking status: Never Smoker  . Smokeless tobacco: Never Used  . Alcohol use No  . Drug use: No  . Sexual activity: Not Asked   Other Topics Concern  . 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      Spiritual Beliefs:       Lifestyle: MWF does kickboxing, vegetarian - trying to go vegan      Caffeine use: Drinks tea rarely                     Current Outpatient Prescriptions:  .  Albiglutide (TANZEUM) 30 MG PEN, Use 30 mg weekly, Disp: 4 each, Rfl: 2 .  albuterol (PROVENTIL HFA;VENTOLIN HFA) 108 (90 Base) MCG/ACT inhaler, Inhale 1-2 puffs into the lungs every 6 (six) hours as needed for wheezing or shortness of breath., Disp: 1 Inhaler, Rfl: 0 .  Blood Glucose Monitoring Suppl (ONE TOUCH ULTRA MINI) w/Device KIT, Use to check sugar daily, Disp: 1 each, Rfl: 0 .  busPIRone (BUSPAR) 15 MG tablet, Take 1/2 tablet by mouth 2 times daily., Disp: 90 tablet, Rfl: 1 .  cyclobenzaprine (FLEXERIL) 10 MG tablet, Take 1 tablet (10 mg total) by mouth 3 (three) times daily as needed for muscle spasms., Disp: 90 tablet, Rfl: 3 .  Dulaglutide (TRULICITY) 4.92 EF/0.0FH SOPN, Inject 0.75 mg weekly under skin, Disp: 5 pen, Rfl: 1 .  Exenatide ER (BYDUREON) 2 MG PEN, Inject 2 mg weekly, Disp: 4 each, Rfl: 2 .  glucose blood (FREESTYLE LITE) test strip, Use as instructed to check 2 times daily, Disp: 200 each, Rfl: 5 .  lamoTRIgine (LAMICTAL) 100 MG tablet, Take 1 tablet (100 mg total) by mouth daily., Disp: 90 tablet, Rfl: 1 .  Lancets (FREESTYLE) lancets, Use as instructed to check sugar 2 times daily, Disp: 200 each, Rfl: 5 .  metFORMIN (GLUCOPHAGE) 1000 MG tablet, Take 1 tablet (1,000 mg total) by mouth 2 (two) times daily with a meal., Disp: 180 tablet, Rfl: 3 .  methocarbamol (ROBAXIN) 500 MG tablet, Take 1 tablet (500 mg total) by mouth at bedtime and may repeat dose one time if needed., Disp: 10 tablet, Rfl: 0 .  sertraline (ZOLOFT) 100 MG tablet, Take 1 tablet (100 mg total) by mouth daily., Disp: 90 tablet, Rfl: 1  EXAM:  Vitals:   05/19/17 0810  BP: 100/64  Pulse: 89  Temp: 98.4 F (36.9 C)  Body mass index is 33.09  kg/m.   GENERAL: vitals reviewed and listed below, alert, oriented, appears well hydrated and in no acute distress  HEENT: head atraumatic, PERRLA, normal appearance of eyes, ears, nose and mouth. moist mucus membranes.  NECK: supple, no masses or lymphadenopathy  LUNGS: clear to auscultation bilaterally, no rales, rhonchi or wheeze  CV: HRRR, no peripheral edema  or cyanosis, normal pedal pulses  ABDOMEN: bowel sounds normal, soft, non tender to palpation, no masses, no rebound or guarding  SKIN: no rash or abnormal lesions  GU/BREAST: declined  MS: normal gait, moves all extremities normally  NEURO: normal gait, speech and thought processing grossly intact, muscle tone grossly intact throughout  PSYCH: normal affect, pleasant and cooperative  ASSESSMENT AND PLAN:  Discussed the following assessment and plan:  Encounter for preventive health examination  Class 1 obesity due to excess calories with serious comorbidity and body mass index (BMI) of 33.0 to 33.9 in adult  Hyperlipidemia, unspecified hyperlipidemia type - Plan: Lipid panel  Type 2 diabetes mellitus with hyperglycemia, with long-term current use of insulin ([facility])  Bipolar disorder, in partial remission, most recent episode mixed ([place])  Encounter for HCV screening test for high risk patient - Plan: Hepatitis C antibody   -Discussed and advised all [place] preventive services health task force level A and B recommendations for age, sex and risks.  -Advised at least 150 minutes of exercise per week and a healthy diet with avoidance of (less then 1 serving per week) processed foods, white starches, red meat, fast foods and sweets and consisting of: * 5-9 servings of fresh fruits and vegetables (not corn or potatoes) *nuts and seeds, beans *olives and olive oil *lean meats such as fish and white chicken  *whole grains  -labs, studies and vaccines per orders this encounter  No orders of the defined types were  placed in this encounter.   Patient advised to return to clinic immediately if symptoms worsen or persist or new concerns.  There are no Patient Instructions on file for this visit.  No Follow-up on file.  Colin Benton R., DO

## 2017-05-19 ENCOUNTER — Ambulatory Visit (INDEPENDENT_AMBULATORY_CARE_PROVIDER_SITE_OTHER): Admitting: Family Medicine

## 2017-05-19 ENCOUNTER — Encounter: Payer: Self-pay | Admitting: Family Medicine

## 2017-05-19 VITALS — BP 100/64 | HR 89 | Temp 98.4°F | Ht 63.75 in | Wt 191.3 lb

## 2017-05-19 DIAGNOSIS — Z794 Long term (current) use of insulin: Secondary | ICD-10-CM

## 2017-05-19 DIAGNOSIS — F3177 Bipolar disorder, in partial remission, most recent episode mixed: Secondary | ICD-10-CM

## 2017-05-19 DIAGNOSIS — E1165 Type 2 diabetes mellitus with hyperglycemia: Secondary | ICD-10-CM | POA: Diagnosis not present

## 2017-05-19 DIAGNOSIS — Z0000 Encounter for general adult medical examination without abnormal findings: Secondary | ICD-10-CM | POA: Diagnosis not present

## 2017-05-19 DIAGNOSIS — E785 Hyperlipidemia, unspecified: Secondary | ICD-10-CM | POA: Diagnosis not present

## 2017-05-19 DIAGNOSIS — Z9189 Other specified personal risk factors, not elsewhere classified: Secondary | ICD-10-CM

## 2017-05-19 DIAGNOSIS — Z6833 Body mass index (BMI) 33.0-33.9, adult: Secondary | ICD-10-CM

## 2017-05-19 DIAGNOSIS — E6609 Other obesity due to excess calories: Secondary | ICD-10-CM | POA: Diagnosis not present

## 2017-05-19 DIAGNOSIS — Z1159 Encounter for screening for other viral diseases: Secondary | ICD-10-CM | POA: Diagnosis not present

## 2017-05-19 LAB — LIPID PANEL
Cholesterol: 258 mg/dL — ABNORMAL HIGH (ref 0–200)
HDL: 51.6 mg/dL (ref >39.00)
LDL Cholesterol: 184 mg/dL — ABNORMAL HIGH (ref 0–99)
NonHDL: 206.73
TRIGLYCERIDES: 113 mg/dL (ref 0.0–149.0)
Total CHOL/HDL Ratio: 5
VLDL: 22.6 mg/dL (ref 0.0–40.0)

## 2017-05-19 LAB — HEPATITIS C ANTIBODY: HCV Ab: NEGATIVE

## 2017-05-19 NOTE — Patient Instructions (Addendum)
BEFORE YOU LEAVE: -follow up: 6 months -phq9  -labs  Please call today to schedule your mammogram.  Make sure you get your diabetic eye exam.  We have ordered labs or studies at this visit. It can take up to 1-2 weeks for results and processing. IF results require follow up or explanation, we will call you with instructions. Clinically stable results will be released to your [facility]. If you have not heard from [place] or cannot find your results in [facility] in 2 weeks please contact our office at [phone].  If you are not yet signed up for [facility], please consider signing up.  Health Maintenance, Female Adopting a healthy lifestyle and getting preventive care can go a long way to promote health and wellness. Talk with your health care provider about what schedule of regular examinations is right for you. This is a good chance for you to check in with your provider about disease prevention and staying healthy. In between checkups, there are plenty of things you can do on your own. Experts have done a lot of research about which lifestyle changes and preventive measures are most likely to keep you healthy. Ask your health care provider for more information. Weight and diet Eat a healthy diet  Be sure to include plenty of vegetables, fruits, low-fat dairy products, and lean protein.  Do not eat a lot of foods high in solid fats, added sugars, or salt.  Get regular exercise. This is one of the most important things you can do for your health. ? Most adults should exercise for at least 150 minutes each week. The exercise should increase your heart rate and make you sweat (moderate-intensity exercise). ? Most adults should also do strengthening exercises at least twice a week. This is in addition to the moderate-intensity exercise.  Maintain a healthy weight  Body mass index (BMI) is a measurement that can be used to identify possible weight problems. It estimates body fat based on height and  weight. Your health care provider can help determine your BMI and help you achieve or maintain a healthy weight.  For females 45 years of age and older: ? A BMI below 18.5 is considered underweight. ? A BMI of 18.5 to 24.9 is normal. ? A BMI of 25 to 29.9 is considered overweight. ? A BMI of 30 and above is considered obese.  Watch levels of cholesterol and blood lipids  You should start having your blood tested for lipids and cholesterol at 54 years of age, then have this test every 5 years.  You may need to have your cholesterol levels checked more often if: ? Your lipid or cholesterol levels are high. ? You are older than 54 years of age. ? You are at high risk for heart disease.  Cancer screening Lung Cancer  Lung cancer screening is recommended for adults 16-36 years old who are at high risk for lung cancer because of a history of smoking.  A yearly low-dose CT scan of the lungs is recommended for people who: ? Currently smoke. ? Have quit within the past 15 years. ? Have at least a 30-pack-year history of smoking. A pack year is smoking an average of one pack of cigarettes a day for 1 year.  Yearly screening should continue until it has been 15 years since you quit.  Yearly screening should stop if you develop a health problem that would prevent you from having lung cancer treatment.  Breast Cancer  Practice breast self-awareness. This means  understanding how your breasts normally appear and feel.  It also means doing regular breast self-exams. Let your health care provider know about any changes, no matter how small.  If you are in your 20s or 30s, you should have a clinical breast exam (CBE) by a health care provider every 1-3 years as part of a regular health exam.  If you are 54 or older, have a CBE every year. Also consider having a breast X-ray (mammogram) every year.  If you have a family history of breast cancer, talk to your health care provider about genetic  screening.  If you are at high risk for breast cancer, talk to your health care provider about having an MRI and a mammogram every year.  Breast cancer gene (BRCA) assessment is recommended for women who have family members with BRCA-related cancers. BRCA-related cancers include: ? Breast. ? Ovarian. ? Tubal. ? Peritoneal cancers.  Results of the assessment will determine the need for genetic counseling and BRCA1 and BRCA2 testing.  Cervical Cancer Your health care provider may recommend that you be screened regularly for cancer of the pelvic organs (ovaries, uterus, and vagina). This screening involves a pelvic examination, including checking for microscopic changes to the surface of your cervix (Pap test). You may be encouraged to have this screening done every 3 years, beginning at age 6.  For women ages 73-65, health care providers may recommend pelvic exams and Pap testing every 3 years, or they may recommend the Pap and pelvic exam, combined with testing for human papilloma virus (HPV), every 5 years. Some types of HPV increase your risk of cervical cancer. Testing for HPV may also be done on women of any age with unclear Pap test results.  Other health care providers may not recommend any screening for nonpregnant women who are considered low risk for pelvic cancer and who do not have symptoms. Ask your health care provider if a screening pelvic exam is right for you.  If you have had past treatment for cervical cancer or a condition that could lead to cancer, you need Pap tests and screening for cancer for at least 20 years after your treatment. If Pap tests have been discontinued, your risk factors (such as having a new sexual partner) need to be reassessed to determine if screening should resume. Some women have medical problems that increase the chance of getting cervical cancer. In these cases, your health care provider may recommend more frequent screening and Pap  tests.  Colorectal Cancer  This type of cancer can be detected and often prevented.  Routine colorectal cancer screening usually begins at 54 years of age and continues through 54 years of age.  Your health care provider may recommend screening at an earlier age if you have risk factors for colon cancer.  Your health care provider may also recommend using home test kits to check for hidden blood in the stool.  A small camera at the end of a tube can be used to examine your colon directly (sigmoidoscopy or colonoscopy). This is done to check for the earliest forms of colorectal cancer.  Routine screening usually begins at age 54.  Direct examination of the colon should be repeated every 5-10 years through 54 years of age. However, you may need to be screened more often if early forms of precancerous polyps or small growths are found.  Skin Cancer  Check your skin from head to toe regularly.  Tell your health care provider about any new moles  or changes in moles, especially if there is a change in a mole's shape or color.  Also tell your health care provider if you have a mole that is larger than the size of a pencil eraser.  Always use sunscreen. Apply sunscreen liberally and repeatedly throughout the day.  Protect yourself by wearing long sleeves, pants, a wide-brimmed hat, and sunglasses whenever you are outside.  Heart disease, diabetes, and high blood pressure  High blood pressure causes heart disease and increases the risk of stroke. High blood pressure is more likely to develop in: ? People who have blood pressure in the high end of the normal range (130-139/85-89 mm Hg). ? People who are overweight or obese. ? People who are African American.  If you are 74-29 years of age, have your blood pressure checked every 3-5 years. If you are 55 years of age or older, have your blood pressure checked every year. You should have your blood pressure measured twice-once when you are at  a hospital or clinic, and once when you are not at a hospital or clinic. Record the average of the two measurements. To check your blood pressure when you are not at a hospital or clinic, you can use: ? An automated blood pressure machine at a pharmacy. ? A home blood pressure monitor.  If you are between 44 years and 30 years old, ask your health care provider if you should take aspirin to prevent strokes.  Have regular diabetes screenings. This involves taking a blood sample to check your fasting blood sugar level. ? If you are at a normal weight and have a low risk for diabetes, have this test once every three years after 54 years of age. ? If you are overweight and have a high risk for diabetes, consider being tested at a younger age or more often. Preventing infection Hepatitis B  If you have a higher risk for hepatitis B, you should be screened for this virus. You are considered at high risk for hepatitis B if: ? You were born in a country where hepatitis B is common. Ask your health care provider which countries are considered high risk. ? Your parents were born in a high-risk country, and you have not been immunized against hepatitis B (hepatitis B vaccine). ? You have HIV or AIDS. ? You use needles to inject street drugs. ? You live with someone who has hepatitis B. ? You have had sex with someone who has hepatitis B. ? You get hemodialysis treatment. ? You take certain medicines for conditions, including cancer, organ transplantation, and autoimmune conditions.  Hepatitis C  Blood testing is recommended for: ? Everyone born from [birth year] through 1963. ? Anyone with known risk factors for hepatitis C.  Sexually transmitted infections (STIs)  You should be screened for sexually transmitted infections (STIs) including gonorrhea and chlamydia if: ? You are sexually active and are younger than 54 years of age. ? You are older than 54 years of age and your health care provider tells  you that you are at risk for this type of infection. ? Your sexual activity has changed since you were last screened and you are at an increased risk for chlamydia or gonorrhea. Ask your health care provider if you are at risk.  If you do not have HIV, but are at risk, it may be recommended that you take a prescription medicine daily to prevent HIV infection. This is called pre-exposure prophylaxis (PrEP). You are considered at risk if: ?  You are sexually active and do not regularly use condoms or know the HIV status of your partner(s). ? You take drugs by injection. ? You are sexually active with a partner who has HIV.  Talk with your health care provider about whether you are at high risk of being infected with HIV. If you choose to begin PrEP, you should first be tested for HIV. You should then be tested every 3 months for as long as you are taking PrEP. Pregnancy  If you are premenopausal and you may become pregnant, ask your health care provider about preconception counseling.  If you may become pregnant, take 400 to 800 micrograms (mcg) of folic acid every day.  If you want to prevent pregnancy, talk to your health care provider about birth control (contraception). Osteoporosis and menopause  Osteoporosis is a disease in which the bones lose minerals and strength with aging. This can result in serious bone fractures. Your risk for osteoporosis can be identified using a bone density scan.  If you are 43 years of age or older, or if you are at risk for osteoporosis and fractures, ask your health care provider if you should be screened.  Ask your health care provider whether you should take a calcium or vitamin D supplement to lower your risk for osteoporosis.  Menopause may have certain physical symptoms and risks.  Hormone replacement therapy may reduce some of these symptoms and risks. Talk to your health care provider about whether hormone replacement therapy is right for  you. Follow these instructions at home:  Schedule regular health, dental, and eye exams.  Stay current with your immunizations.  Do not use any tobacco products including cigarettes, chewing tobacco, or electronic cigarettes.  If you are pregnant, do not drink alcohol.  If you are breastfeeding, limit how much and how often you drink alcohol.  Limit alcohol intake to no more than 1 drink per day for nonpregnant women. One drink equals 12 ounces of beer, 5 ounces of wine, or 1 ounces of hard liquor.  Do not use street drugs.  Do not share needles.  Ask your health care provider for help if you need support or information about quitting drugs.  Tell your health care provider if you often feel depressed.  Tell your health care provider if you have ever been abused or do not feel safe at home. This information is not intended to replace advice given to you by your health care provider. Make sure you discuss any questions you have with your health care provider. Document Released: 06/20/2011 Document Revised: 05/12/2016 Document Reviewed: 09/08/2015 Elsevier Interactive Patient Education  [employer].

## 2017-05-22 ENCOUNTER — Other Ambulatory Visit: Payer: Self-pay

## 2017-05-22 MED ORDER — EXENATIDE ER 2 MG ~~LOC~~ PEN: PEN_INJECTOR | SUBCUTANEOUS | 2 refills | Status: DC

## 2017-05-22 MED ORDER — ATORVASTATIN CALCIUM 20 MG PO TABS: 20.0000 mg | ORAL_TABLET | Freq: Every day | ORAL | 5 refills | Status: DC

## 2017-05-22 NOTE — Addendum Note (Signed)
Addended by: Agnes Lawrence on: 05/22/2017 10:11 AM   Modules accepted: Orders

## 2017-06-06 ENCOUNTER — Emergency Department (HOSPITAL_COMMUNITY)
Admission: EM | Admit: 2017-06-06 | Discharge: 2017-06-06 | Disposition: A | Discharge status: Home / Self Care | Attending: Emergency Medicine | Admitting: Emergency Medicine

## 2017-06-06 ENCOUNTER — Encounter (HOSPITAL_COMMUNITY): Payer: Self-pay | Admitting: Emergency Medicine

## 2017-06-06 ENCOUNTER — Emergency Department (HOSPITAL_COMMUNITY)

## 2017-06-06 DIAGNOSIS — R52 Pain, unspecified: Secondary | ICD-10-CM

## 2017-06-06 DIAGNOSIS — J45909 Unspecified asthma, uncomplicated: Secondary | ICD-10-CM | POA: Insufficient documentation

## 2017-06-06 DIAGNOSIS — G8929 Other chronic pain: Secondary | ICD-10-CM | POA: Insufficient documentation

## 2017-06-06 DIAGNOSIS — Z7984 Long term (current) use of oral hypoglycemic drugs: Secondary | ICD-10-CM | POA: Insufficient documentation

## 2017-06-06 DIAGNOSIS — Z79899 Other long term (current) drug therapy: Secondary | ICD-10-CM | POA: Diagnosis not present

## 2017-06-06 DIAGNOSIS — I252 Old myocardial infarction: Secondary | ICD-10-CM | POA: Diagnosis not present

## 2017-06-06 DIAGNOSIS — E119 Type 2 diabetes mellitus without complications: Secondary | ICD-10-CM | POA: Insufficient documentation

## 2017-06-06 DIAGNOSIS — M549 Dorsalgia, unspecified: Secondary | ICD-10-CM

## 2017-06-06 DIAGNOSIS — M545 Low back pain: Secondary | ICD-10-CM | POA: Diagnosis not present

## 2017-06-06 DIAGNOSIS — I2510 Atherosclerotic heart disease of native coronary artery without angina pectoris: Secondary | ICD-10-CM | POA: Insufficient documentation

## 2017-06-06 LAB — I-STAT CHEM 8, ED
BUN: 18 mg/dL (ref 6–20)
CALCIUM ION: 1.1 mmol/L — AB (ref 1.15–1.40)
CREATININE: 0.6 mg/dL (ref 0.44–1.00)
Chloride: 105 mmol/L (ref 101–111)
Glucose, Bld: 118 mg/dL — ABNORMAL HIGH (ref 65–99)
HCT: 38 % (ref 36.0–46.0)
Hemoglobin: 12.9 g/dL (ref 12.0–15.0)
Potassium: 3.6 mmol/L (ref 3.5–5.1)
SODIUM: 139 mmol/L (ref 135–145)
TCO2: 24 mmol/L (ref 0–100)

## 2017-06-06 LAB — URINALYSIS, ROUTINE W REFLEX MICROSCOPIC
BILIRUBIN URINE: NEGATIVE
Glucose, UA: NEGATIVE mg/dL
HGB URINE DIPSTICK: NEGATIVE
Ketones, ur: NEGATIVE mg/dL
Nitrite: NEGATIVE
Protein, ur: 30 mg/dL — AB
SPECIFIC GRAVITY, URINE: 1.03 (ref 1.005–1.030)
pH: 5 (ref 5.0–8.0)

## 2017-06-06 LAB — CBG MONITORING, ED: Glucose-Capillary: 129 mg/dL — ABNORMAL HIGH (ref 65–99)

## 2017-06-06 MED ORDER — GADOBENATE DIMEGLUMINE 529 MG/ML IV SOLN
20.0000 mL | Freq: Once | INTRAVENOUS | Status: AC | PRN
  Administered 2017-06-06: 18 mL via INTRAVENOUS

## 2017-06-06 MED ORDER — ACETAMINOPHEN 500 MG PO TABS
1000.0000 mg | ORAL_TABLET | Freq: Once | ORAL | Status: AC
  Administered 2017-06-06: 1000 mg via ORAL
  Filled 2017-06-06: qty 2

## 2017-06-06 NOTE — ED Provider Notes (Signed)
[facility] DEPT Provider Note   CSN: [id number] Arrival date & time: 06/06/17  1556     History   Chief Complaint Chief Complaint  Patient presents with  . Back Pain  . Urinary Frequency    HPI Kerri Osborn is a 56 y.o. female.Complains of low back pain radiating to both feet for 1 year. She presents today as she became incontinent of urine today. She denies any fever. No injury. Treated with anti-inflammatories, Tylenol without relief. Describes pain as spasm. Pain is worse with changing positions improved with remaining still.  HPI  Past Medical History:  Diagnosis Date  . Allergy   . Arthritis   . Arthritis, rheumatoid ([place])   . Asthma   . Bipolar disorder ([place])   . Chicken pox   . Coronary artery disease   . Depression   . Diabetes mellitus   . Diverticulosis   . Dyspnea 04/13/2011   pfts 03/2011:  Normal FEV1 and FEV1/FVC Ct chest 2012:  No PE, normal parenchyma [name] 04/18/2011 with active symptoms:  Normal FEV1%, mild decrease in FVC due to restriction vs airtrapping, normal appearing       flow volume loop.    . Fatty liver   . Migraines   . Myocardial infarction ([place])   . Polyarthralgia - managed by Dr. Ouida Sills per her report 02/05/2013  . 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 Seasonal allergies     Patient Active Problem List   Diagnosis Date Noted  . Type 2 diabetes mellitus with hyperglycemia, with long-term current use of insulin ([place]) 05/02/2016  . Hyperlipidemia 04/14/2016  . Obesity 04/14/2016  . Bipolar affective disorder ([place]) 02/05/2013  . Polyarthralgia - managed by Dr. Ouida Sills per her report 02/05/2013    Past Surgical History:  Procedure Laterality Date  . ABDOMINAL HYSTERECTOMY    . APPENDECTOMY    . BREAST CYST EXCISION    . CESAREAN SECTION    . INNER EAR SURGERY     tubes in ears  . MYOMECTOMY    . TUBAL LIGATION      OB History    No data available       Home Medications    Prior to Admission medications    Medication Sig Start Date End Date Taking? Authorizing Provider  albuterol (PROVENTIL HFA;VENTOLIN HFA) 108 (90 Base) MCG/ACT inhaler Inhale 1-2 puffs into the lungs every 6 (six) hours as needed for wheezing or shortness of breath. 01/05/17   Recardo Evangelist, PA-C  atorvastatin (LIPITOR) 20 MG tablet Take 1 tablet (20 mg total) by mouth daily. 05/22/17   Lucretia Kern, DO  Blood Glucose Monitoring Suppl (ONE TOUCH ULTRA MINI) w/Device KIT Use to check sugar daily 04/27/17   Philemon Kingdom, MD  busPIRone (BUSPAR) 15 MG tablet Take 1/2 tablet by mouth 2 times daily. 11/17/14   Philemon Kingdom, MD  cyclobenzaprine (FLEXERIL) 10 MG tablet Take 1 tablet (10 mg total) by mouth 3 (three) times daily as needed for muscle spasms. 06/08/16   Melvenia Beam, MD  Dulaglutide (TRULICITY) 0.92 HV/7.4BB SOPN Inject 0.75 mg weekly under skin 05/04/17   Philemon Kingdom, MD  Exenatide ER (BYDUREON) 2 MG PEN Inject 2 mg weekly 05/22/17   Philemon Kingdom, MD  glucose blood (FREESTYLE LITE) test strip Use as instructed to check 2 times daily 04/28/17   Philemon Kingdom, MD  lamoTRIgine (LAMICTAL) 100 MG tablet Take 1 tablet (100 mg total) by mouth daily. 11/17/14  Philemon Kingdom, MD  Lancets (FREESTYLE) lancets Use as instructed to check sugar 2 times daily 04/28/17   Philemon Kingdom, MD  metFORMIN (GLUCOPHAGE) 1000 MG tablet Take 1 tablet (1,000 mg total) by mouth 2 (two) times daily with a meal. 05/04/17 06/03/17  Philemon Kingdom, MD  metFORMIN (GLUCOPHAGE) 500 MG tablet TAKE ONE TABLET BY MOUTH TWICE A DAY WITH A MEAL 04/24/17   [provider]  methocarbamol (ROBAXIN) 500 MG tablet Take 1 tablet (500 mg total) by mouth at bedtime and may repeat dose one time if needed. 11/19/16   Recardo Evangelist, PA-C  sertraline (ZOLOFT) 100 MG tablet Take 1 tablet (100 mg total) by mouth daily. 11/17/14   Philemon Kingdom, MD    Family History Family History  Problem Relation Age of Onset  . Sudden  death Mother   . Kidney failure Mother   . Sudden death Father   . Allergies Sister   . Allergies Daughter   . Allergies Daughter   . Asthma Daughter   . Asthma Daughter   . Arthritis Maternal Grandmother   . Hyperlipidemia Maternal Grandmother   . Diabetes Maternal Grandmother   . Hypertension Maternal Aunt   . Mental illness Unknown   . Colon cancer Neg Hx   . Rectal cancer Neg Hx   . Stomach cancer Neg Hx   . Neuropathy Neg Hx     Social History Social History  Substance Use Topics  . Smoking status: Never Smoker  . Smokeless tobacco: Never Used  . Alcohol use No     Allergies   Amoxicillin   Review of Systems Review of Systems  Constitutional: Negative.   HENT: Negative.   Respiratory: Negative.   Cardiovascular: Negative.   Gastrointestinal: Negative.   Genitourinary:       Urinary incontinence  Musculoskeletal: Positive for back pain.  Skin: Negative.   Allergic/Immunologic: Positive for immunocompromised state.       Diabetic  Neurological: Negative.   Psychiatric/Behavioral: Negative.   All other systems reviewed and are negative.    Physical Exam Updated Vital Signs BP (!) 148/72 (BP Location: Right Arm)   Pulse 78   Temp 98.1 F (36.7 C) (Oral)   Resp 16   Ht 5' 3.75" (1.619 m)   Wt 86.6 kg (191 lb)   SpO2 98%   BMI 33.04 kg/m   Physical Exam  Constitutional: She is oriented to person, place, and time. She appears well-developed and well-nourished.  HENT:  Head: Normocephalic and atraumatic.  Eyes: Conjunctivae are normal. Pupils are equal, round, and reactive to light.  Neck: Neck supple. No tracheal deviation present. No thyromegaly present.  Cardiovascular: Normal rate, regular rhythm, normal heart sounds and intact distal pulses.   No murmur heard. Pulmonary/Chest: Effort normal and breath sounds normal.  Abdominal: Soft. Bowel sounds are normal. She exhibits no distension. There is no tenderness.  Musculoskeletal: Normal range  of motion. She exhibits no edema or tenderness.  No tenderness along spine. She has pain at lumbar area when she stands from a seated position  Neurological: She is alert and oriented to person, place, and time. Coordination normal.  Gait normal DTR symmetric bilaterally at knee jerk and ankle jerks toes downward going bilaterally  Skin: Skin is warm and dry. No rash noted.  Psychiatric: She has a normal mood and affect.  Nursing note and vitals reviewed.    ED Treatments / Results  Labs (all labs ordered are listed, but only abnormal results are  displayed) Labs Reviewed  URINALYSIS, ROUTINE W REFLEX MICROSCOPIC - Abnormal; Notable for the following:       Result Value   Protein, ur 30 (*)    Leukocytes, UA TRACE (*)    Bacteria, UA RARE (*)    Squamous Epithelial / LPF 0-5 (*)    All other components within normal limits    EKG  EKG Interpretation None       Radiology No results found.  Procedures Procedures (including critical care time)  Medications Ordered in ED Medications - No data to display  Results for orders placed or performed during the hospital encounter of 06/06/17  Urinalysis, Routine w reflex microscopic- may I&O cath if menses  Result Value Ref Range   Color, Urine YELLOW YELLOW   APPearance CLEAR CLEAR   Specific Gravity, Urine 1.030 1.005 - 1.030   pH 5.0 5.0 - 8.0   Glucose, UA NEGATIVE NEGATIVE mg/dL   Hgb urine dipstick NEGATIVE NEGATIVE   Bilirubin Urine NEGATIVE NEGATIVE   Ketones, ur NEGATIVE NEGATIVE mg/dL   Protein, ur 30 (A) NEGATIVE mg/dL   Nitrite NEGATIVE NEGATIVE   Leukocytes, UA TRACE (A) NEGATIVE   RBC / HPF 0-5 0 - 5 RBC/hpf   WBC, UA 0-5 0 - 5 WBC/hpf   Bacteria, UA RARE (A) NONE SEEN   Squamous Epithelial / LPF 0-5 (A) NONE SEEN   Mucous PRESENT    Hyaline Casts, UA PRESENT   I-stat chem 8, ed  Result Value Ref Range   Sodium 139 135 - 145 mmol/L   Potassium 3.6 3.5 - 5.1 mmol/L   Chloride 105 101 - 111 mmol/L   BUN  18 6 - 20 mg/dL   Creatinine, Ser 0.60 0.44 - 1.00 mg/dL   Glucose, Bld 118 (H) 65 - 99 mg/dL   Calcium, Ion 1.10 (L) 1.15 - 1.40 mmol/L   TCO2 24 0 - 100 mmol/L   Hemoglobin 12.9 12.0 - 15.0 g/dL   HCT 38.0 36.0 - 46.0 %  POC CBG, ED  Result Value Ref Range   Glucose-Capillary 129 (H) 65 - 99 mg/dL   Mr Lumbar Spine W Wo Contrast  Result Date: 06/06/2017 CLINICAL DATA:  Chronic low back pain radiating to proximal legs. New onset urinary frequency and subjective incontinence. History of diabetes, rheumatoid arthritis. EXAM: MRI LUMBAR SPINE WITHOUT AND WITH CONTRAST TECHNIQUE: Multiplanar and multiecho pulse sequences of the lumbar spine were obtained without and with intravenous contrast. CONTRAST:  36m MULTIHANCE GADOBENATE DIMEGLUMINE 529 MG/ML IV SOLN COMPARISON:  MRI of lumbar spine June 17, 2016 FINDINGS: SEGMENTATION: For the purposes of this report, the last well-formed intervertebral disc will be described as L5-S1. ALIGNMENT: Maintenance of the lumbar lordosis. No malalignment. VERTEBRAE:Vertebral bodies are intact. Intervertebral discs demonstrate normal morphology and signal characteristics. Bright STIR signal LEFT L3-4 facet with faint enhancement. CONUS MEDULLARIS: Conus medullaris terminates at L1-2 and demonstrates normal morphology and signal characteristics. Cauda equina is normal. No abnormal cord, leptomeningeal or epidural enhancement. PARASPINAL AND SOFT TISSUES: Included prevertebral and paraspinal soft tissues are normal. LEFT S1-2 nerve roots may be conjoined. DISC LEVELS: T12-L1 thru L2-3: No disc bulge, canal stenosis nor neural foraminal narrowing. L3-4: Mild LEFT facet arthropathy, 7 mm LEFT facet synovial cyst extending the paraspinal soft tissues. No disc bulge, canal stenosis or neural foraminal narrowing. L4-5:  Mild RIGHT facet arthropathy.  Cervical disc L5-S1: Mild to moderate facet arthropathy. No disc bulge, canal stenosis nor neural foraminal narrowing.  IMPRESSION: Mild lower  lumbar facet arthropathy, with acute inflammatory changes LEFT L3-4 facet. Otherwise MRI of the lumbar spine with and without contrast. Electronically Signed   By: Elon Alas M.D.   On: 06/06/2017 22:21   Initial Impression / Assessment and Plan / ED Course  I have reviewed the triage vital signs and the nursing notes.  Pertinent labs & imaging results that were available during my care of the patient were reviewed by me and considered in my medical decision making (see chart for details).     MRI scan of lumbar spine ordered as patient is immune compromised reports incontinence of urine No evidence of cauda equina syndrome or infection. Plan Tylenol for pain. Follow-up with PMD. Suggest request pain clinic  Final Clinical Impressions(s) / ED Diagnoses  Diagnosis chronic back pain Final diagnoses:  None    New Prescriptions New Prescriptions   No medications on file     Orlie Dakin, MD 06/06/17 2259

## 2017-06-06 NOTE — Discharge Instructions (Signed)
Take Tylenol as directed for pain. Call your primary care physician. Ask for a referral to a pain management clinic if you wish

## 2017-06-06 NOTE — ED Triage Notes (Addendum)
Patient c/o chronic lower back pain radiating into bilateral thighs. Pt states she also has urinary frequency onset of this am, feels like she cannot hold her bladder. Took 2 pills of acetaminophen with no relief. Pt states pain is worse when laying flat.

## 2017-08-11 ENCOUNTER — Encounter: Payer: Self-pay | Admitting: Internal Medicine

## 2017-08-11 ENCOUNTER — Ambulatory Visit (INDEPENDENT_AMBULATORY_CARE_PROVIDER_SITE_OTHER): Admitting: Internal Medicine

## 2017-08-11 VITALS — BP 130/84 | HR 72 | Ht 64.5 in | Wt 190.0 lb

## 2017-08-11 DIAGNOSIS — Z794 Long term (current) use of insulin: Secondary | ICD-10-CM | POA: Diagnosis not present

## 2017-08-11 DIAGNOSIS — E785 Hyperlipidemia, unspecified: Secondary | ICD-10-CM

## 2017-08-11 DIAGNOSIS — E1165 Type 2 diabetes mellitus with hyperglycemia: Secondary | ICD-10-CM | POA: Diagnosis not present

## 2017-08-11 LAB — POCT GLYCOSYLATED HEMOGLOBIN (HGB A1C): Hemoglobin A1C: 7.9

## 2017-08-11 MED ORDER — METFORMIN HCL 1000 MG PO TABS: 1000.0000 mg | ORAL_TABLET | Freq: Two times a day (BID) | ORAL | 3 refills | Status: DC

## 2017-08-11 NOTE — Addendum Note (Signed)
Addended by: Ena Dawley on: 08/11/2017 09:32 AM   Modules accepted: Orders

## 2017-08-11 NOTE — Progress Notes (Signed)
Subjective:     Patient ID: Kerri Osborn, female   DOB: 1961-03-27, 56 y.o.   MRN: [id number]  HPI Kerri Osborn is a 56 y.o. woman, returning for f/u for DM2, dx. 9563, without complications, uncontrolled, insulin-dependent since 2012. Last visit 3 mo ago.  Since last visit, she started to exercise (2 mi a day) and adjust her diet (started vegan diet again). Asthma and RA is worse.  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  Her last A1c was: Lab Results  Component Value Date   HGBA1C 10.9 05/04/2017   HGBA1C 8.2 (H) 08/01/2016   HGBA1C 7.6 (H) 02/26/2016   Previous treatment regimen: - VGo 20 + 3 clicks per meal - since last year >> bruising and numbness >> would like switch to injections - JanuMet 50-1000 mg daily bid She wanted to come off the VGo as it caused skin changes. - Lantus 24 >> 26 >> 20 units at bedtime  - NovoLog 6 >> 10 >> 5 units 15 min before every meal - 3x a day - Sliding scale of NovoLog - does not need to use it lately: - 150-175: + 1 unit  - 176-200: + 2 units  - 201-225: + 3 units  - >225: + 4 units  She was out of Janumet 50-1000 mg 2x a day - for 2 months 2/2 insurance coverage.  She stopped all insulin for >1 year. Restarted Metformin before last visit.  Now on: - Metformin 500 mg 2x a day - started 04/24/2017 We tried to start Trulicity and Bydureon - not covered.  She checks sugars 1-2x a day (no log, meter): - am: end of her day (works nights): 133-267 >> 223-229 >> 140s - 2h after b'fast: n/c >> highest 165 (pancakes) >> n/c - before lunch: 140-160 >> n/c - 2h after lunch: n/c >> 108-120 >> n/c - before dinner: 100-103 >> 200s >> (waking up)  65-80s >> n/c - bedtime: 140-150s >> n/c >> 140-170s She has hypoglycemia awareness at 60. Lowest 65 >> low 100s She was vegan in the past  but was advised by GI to switch to regular diet (diverticulosis on colonoscopy). Now restarted vegan diet.  - Last eye appt: 02/2015 >> No DR  - No CKD: Lab  Results  Component Value Date   BUN 18 06/06/2017   BUN 22 (H) 04/23/2017   Lab Results  Component Value Date   CREATININE 0.60 06/06/2017   CREATININE 0.64 04/23/2017  Not on an  ACE inhibitor/ARB. Last ACR: Lab Results  Component Value Date   MICRALBCREAT 11.1 08/01/2016   MICRALBCREAT 23.1 06/25/2015   MICRALBCREAT 7.9 04/15/2014   MICRALBCREAT 8.1 02/05/2013   - + HL: Lab Results  Component Value Date   CHOL 258 (H) 05/19/2017   HDL 51.60 05/19/2017   LDLCALC 184 (H) 05/19/2017   LDLDIRECT 146.2 02/05/2013   TRIG 113.0 05/19/2017   CHOLHDL 5 05/19/2017  On Lipitor >> not compliant. She denies numbness tingling in her feet.  Also has RA - Dr. Ouida Sills (rheum.).  Husband died in 08/23/15 (hemorrhagic stroke).  Review of Systems Constitutional: no weight gain/no weight loss, + fatigue, no subjective hyperthermia, no subjective hypothermia Eyes: no blurry vision, no xerophthalmia ENT: no sore throat, no nodules palpated in throat, no dysphagia, no odynophagia, no hoarseness Cardiovascular: no CP/no SOB/no palpitations/no leg swelling Respiratory: no cough/no SOB/+ wheezing Gastrointestinal: no N/no V/no D/no C/no acid reflux Musculoskeletal: no muscle aches/+ joint aches Skin: no rashes, no hair  loss Neurological: no tremors/no numbness/no tingling/no dizziness  I reviewed pt's medications, allergies, PMH, social hx, family hx, and changes were documented in the history of present illness. Otherwise, unchanged from my initial visit note.   Objective:   Physical Exam BP 130/84 (BP Location: Left Arm, Patient Position: Sitting)   Pulse 72   Ht 5' 4.5" (1.638 m)   Wt 190 lb (86.2 kg)   SpO2 98%   BMI 32.11 kg/m  Body mass index is 32.11 kg/m. Wt Readings from Last 3 Encounters:  08/11/17 190 lb (86.2 kg)  06/06/17 191 lb (86.6 kg)  05/19/17 191 lb 4.8 oz (86.8 kg)   Constitutional: overweight, in NAD Eyes: PERRLA, EOMI, no exophthalmos ENT: moist mucous  membranes, no thyromegaly, no cervical lymphadenopathy Cardiovascular: RRR, No MRG Respiratory: CTA B Gastrointestinal: abdomen soft, NT, ND, BS+ Musculoskeletal: no deformities, strength intact in all 4 Skin: moist, warm, no rashes Neurological: no tremor with outstretched hands, DTR normal in all 4   Assessment:     1. DM2, without long complications, uncontrolled, insulin-dependent, with hyperglycemia  2. HL    Plan:     1. DM2 - Patient with long standing DM2, with significant decrease in sugars after she restarted the vegan diet and started to exercise. She is only on half-max dose of Metformin >> will increase as sugars are not quite at goal yet. No other changes needed. - I also advised her to start walking again - I advised her to:  Patient Instructions  Please look in into "[employer] Diet".  Please increase Metformin to 1000 mg 2x a day with meals.  Please come back for a follow-up appointment in 3 months.  - today, HbA1c is 7.9% (much better) - continue checking sugars at different times of the day - check 1x a day, rotating checks - advised for yearly eye exams >> she is due - Return to clinic in 3 mo with sugar log   2. HL - reviewed latest lipid panel >> LDL very high!  - on Lipitor >> needs compliance - continue vegan diet >> this should greatly help   Philemon Kingdom, MD PhD [facility]

## 2017-08-11 NOTE — Patient Instructions (Addendum)
Please look in into "The Engine 2 Diet".  Please increase Metformin to 1000 mg 2x a day with meals.  Please come back for a follow-up appointment in 3 months.

## 2017-08-17 ENCOUNTER — Telehealth: Payer: Self-pay | Admitting: Internal Medicine

## 2017-08-17 NOTE — Telephone Encounter (Signed)
Routing to you °

## 2017-08-17 NOTE — Telephone Encounter (Signed)
Dropped off ppw on 8/28 and wants to know if it's ready to be picked up?  Ty,  -LL

## 2017-08-25 NOTE — Telephone Encounter (Signed)
Routing to you °

## 2017-08-25 NOTE — Telephone Encounter (Signed)
Patient calling to check the status of the note below about her paperwork. Patient would like to pick up the paperwork by 3pm today. Patient states that this paperwork is time sensitive. Call to advise as soon as possible.

## 2017-08-28 ENCOUNTER — Telehealth: Payer: Self-pay

## 2017-08-28 ENCOUNTER — Ambulatory Visit (INDEPENDENT_AMBULATORY_CARE_PROVIDER_SITE_OTHER): Admitting: Family Medicine

## 2017-08-28 ENCOUNTER — Encounter: Payer: Self-pay | Admitting: Family Medicine

## 2017-08-28 VITALS — BP 122/60 | HR 74 | Temp 98.7°F | Ht 64.5 in | Wt 192.4 lb

## 2017-08-28 DIAGNOSIS — M2550 Pain in unspecified joint: Secondary | ICD-10-CM | POA: Diagnosis not present

## 2017-08-28 DIAGNOSIS — Z8739 Personal history of other diseases of the musculoskeletal system and connective tissue: Secondary | ICD-10-CM | POA: Diagnosis not present

## 2017-08-28 DIAGNOSIS — M79631 Pain in right forearm: Secondary | ICD-10-CM | POA: Diagnosis not present

## 2017-08-28 NOTE — Telephone Encounter (Signed)
Called and advised with patient, they advised her with rheumatoid arthritis this was a condition that was not safe to pull plasma, patient understood, and had no other questions for [place].

## 2017-08-28 NOTE — Progress Notes (Signed)
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 Ouida Sills because he told her she didn't have RA and another rheumatologist in his office said she did - she was frustrated, wants referral to re-est with rheum as has in general a lot of aches an pains -reports diabetes is much better and recent hgba1c 7.0 -reports does diabetes care with Dr. Cruzita Lederer -report has diabetic eye exam set up -agrees to schedule mammogram  ROS: See pertinent positives and negatives per HPI.  Past Medical History:  Diagnosis Date  . Allergy   . Arthritis   . Arthritis, rheumatoid ([place])   . Asthma   . Bipolar disorder ([place])   . Chicken pox   . Coronary artery disease   . Depression   . Diabetes mellitus   . Diverticulosis   . Dyspnea 04/13/2011   pfts 03/2011:  Normal FEV1 and FEV1/FVC Ct chest 2012:  No PE, normal parenchyma [name] 04/18/2011 with active symptoms:  Normal FEV1%, mild decrease in FVC due to restriction vs airtrapping, normal appearing       flow volume loop.    . Fatty liver   . Migraines   . Myocardial infarction ([place])   . Polyarthralgia - managed by Dr. Ouida Sills per her report 02/05/2013  . 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 Seasonal allergies     Past Surgical History:  Procedure Laterality Date  . ABDOMINAL HYSTERECTOMY    . APPENDECTOMY    . BREAST CYST EXCISION    . CESAREAN SECTION    . INNER EAR SURGERY     tubes in ears  . MYOMECTOMY    . TUBAL LIGATION      Family History  Problem Relation Age of Onset  . Sudden death Mother   . Kidney failure Mother   .  Sudden death Father   . Allergies Sister   . Allergies Daughter   . Allergies Daughter   . Asthma Daughter   . Asthma Daughter   . Arthritis Maternal Grandmother   . Hyperlipidemia Maternal Grandmother   . Diabetes Maternal Grandmother   . Hypertension Maternal Aunt   . Mental illness Unknown   . Colon cancer Neg Hx   . Rectal cancer Neg Hx   . Stomach cancer Neg Hx   . Neuropathy Neg Hx     Social History   Social History  . Marital status: Widowed    Spouse name: N/A  . Number of children: 2  . Years of education: 16   Occupational History  . [employer]     Social History Main Topics  . Smoking status: Never Smoker  . Smokeless tobacco: Never Used  . Alcohol use No  . Drug use: No  . Sexual activity: Not Asked   Other Topics Concern  . 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      Spiritual Beliefs:       Lifestyle: MWF does kickboxing, vegetarian - trying  to go vegan      Caffeine use: Drinks tea rarely                     Current Outpatient Prescriptions:  .  albuterol (PROVENTIL HFA;VENTOLIN HFA) 108 (90 Base) MCG/ACT inhaler, Inhale 1-2 puffs into the lungs every 6 (six) hours as needed for wheezing or shortness of breath., Disp: 1 Inhaler, Rfl: 0 .  Blood Glucose Monitoring Suppl (ONE TOUCH ULTRA MINI) w/Device KIT, Use to check sugar daily, Disp: 1 each, Rfl: 0 .  busPIRone (BUSPAR) 15 MG tablet, Take 1/2 tablet by mouth 2 times daily., Disp: 90 tablet, Rfl: 1 .  cyclobenzaprine (FLEXERIL) 10 MG tablet, Take 1 tablet (10 mg total) by mouth 3 (three) times daily as needed for muscle spasms., Disp: 90 tablet, Rfl: 3 .  glucose blood (FREESTYLE LITE) test strip, Use as instructed to check 2 times daily, Disp: 200 each, Rfl: 5 .  lamoTRIgine (LAMICTAL) 100 MG tablet, Take 1 tablet (100 mg total) by mouth daily., Disp: 90 tablet, Rfl: 1 .  Lancets  (FREESTYLE) lancets, Use as instructed to check sugar 2 times daily, Disp: 200 each, Rfl: 5 .  metFORMIN (GLUCOPHAGE) 1000 MG tablet, Take 1 tablet (1,000 mg total) by mouth 2 (two) times daily with a meal., Disp: 180 tablet, Rfl: 3 .  sertraline (ZOLOFT) 100 MG tablet, Take 1 tablet (100 mg total) by mouth daily., Disp: 90 tablet, Rfl: 1  EXAM:  Vitals:   08/28/17 1347  BP: 122/60  Pulse: 74  Temp: 98.7 F (37.1 C)    Body mass index is 32.52 kg/m.  GENERAL: vitals reviewed and listed above, alert, oriented, appears well hydrated and in no acute distress  HEENT: atraumatic, conjunttiva clear, no obvious abnormalities on inspection of external nose and ears  NECK: no obvious masses on inspection  LUNGS: clear to auscultation bilaterally, no wheezes, rales or rhonchi, good air movement  CV: HRRR, no peripheral edema  MS: moves all extremities without noticeable abnormality, Apparently normal inspection of the forearm and wrists, she has tenderness to palpation in the extensor tendons of the right forearm at the proximal attachment primarily, normal strength throughout in the wrists, fingers, forearm, normal sensitivity to light touch, monofilament and prick throughout in the forearms and hands, distal pulses normal  PSYCH: pleasant and cooperative, no obvious depression or anxiety  ASSESSMENT AND PLAN:  Discussed the following assessment and plan:  Right forearm pain -we discussed possible serious and likely etiologies, workup and treatment, treatment risks and return precautions - exam findings suggest tendinopathy/tennis elbow -after this discussion, Kerri Osborn opted for home exercises, ice, menthol as needed for pain, NSAIDs as needed -follow up advised in 1 month -of course, we advised Kerri Osborn  to return or notify a doctor immediately if symptoms worsen or persist or new concerns arise.  Polyarthralgia - Plan: Ambulatory referral to Rheumatology Hx of rheumatoid arthritis - Plan:  Ambulatory referral to Rheumatology -frustrated with prior rheumatologist, Dr. Ouida Sills -Reports she was told she had rheumatoid arthritis and was on several medications, then was told she did not have rheumatoid arthritis, but then reports she saw another provider in his office and was told she did have rheumatoid arthritis -She has long-standing aches and pains in multiple areas and wants to see a different rheumatologist for evaluation -Referral placed  -Patient advised to return or notify a doctor immediately if symptoms worsen or persist or new concerns arise.  Patient Instructions  BEFORE YOU  LEAVE: -tennis elbow exercises -follow up: 1 month  Please ice twice daily.  Can use topical menthol (tiger balm) for pain as needed. Aleve as needed - but try to limit if possible.  -We placed a referral for you as discussed to the rheumatologist. It usually takes about 1-2 weeks to process and schedule this referral. If you have not heard from [place] regarding this appointment in 2 weeks please contact our office.    Colin Benton R., DO

## 2017-08-28 NOTE — Telephone Encounter (Signed)
Called patient to advise her to call back regarding which paperwork was needed. So I could recall what it was we were looking for. Gave call back number.

## 2017-08-28 NOTE — Telephone Encounter (Signed)
Patient called regarding the paperwork for the Plasma that we filled out about 2 weeks ago, she stated that when she called the lab place to ask about the paperwork they told her that based off of the paperwork from [place], she was not a good candidate to give plasma, and she wanted to know why. She has her diabetes under control, her a1c went from a 10 to a 7. She was doing this in her husbands memory, and she is upset that she can not give plasma now. Please advise, thank you!

## 2017-08-28 NOTE — Telephone Encounter (Signed)
I am sorry to hear that! Let us know if we can help in any way. I am not sure of their criteria for selection.[name] KitchenMarland Kitchen

## 2017-08-28 NOTE — Patient Instructions (Signed)
BEFORE YOU LEAVE: -tennis elbow exercises -follow up: 1 month  Please ice twice daily.  Can use topical menthol (tiger balm) for pain as needed. Aleve as needed - but try to limit if possible.  -We placed a referral for you as discussed to the rheumatologist. It usually takes about 1-2 weeks to process and schedule this referral. If you have not heard from [place] regarding this appointment in 2 weeks please contact our office.

## 2017-08-28 NOTE — Telephone Encounter (Signed)
Patient returning phone call. Unable to patch calls through to Endo. Call patient to advise.

## 2017-09-05 ENCOUNTER — Telehealth: Payer: Self-pay | Admitting: Family Medicine

## 2017-09-05 NOTE — Telephone Encounter (Signed)
I called the pt and informed her of the message below and she agreed to call their office to check for cancellations.

## 2017-09-05 NOTE — Telephone Encounter (Signed)
That is actually pretty soon for rheumatology - she could try calling the rheum office for wait list?

## 2017-09-05 NOTE — Telephone Encounter (Signed)
Pt state that she can not get into the Rheumatologist until Nov. 6, 2018 and she would like to see if Dr. Maudie Mercury could get her in some where sooner due to the pain that she is having.

## 2017-09-06 ENCOUNTER — Telehealth: Payer: Self-pay | Admitting: Family Medicine

## 2017-09-06 NOTE — Telephone Encounter (Signed)
FYI the pt is calling to let dr kim know she now has an appt to see michelle young NP at rheumatologist office on 10-20-17

## 2017-09-07 ENCOUNTER — Encounter (HOSPITAL_COMMUNITY): Payer: Self-pay

## 2017-09-07 ENCOUNTER — Emergency Department (HOSPITAL_COMMUNITY)
Admission: EM | Admit: 2017-09-07 | Discharge: 2017-09-07 | Disposition: A | Discharge status: Home / Self Care | Attending: Emergency Medicine | Admitting: Emergency Medicine

## 2017-09-07 DIAGNOSIS — Z79899 Other long term (current) drug therapy: Secondary | ICD-10-CM | POA: Diagnosis not present

## 2017-09-07 DIAGNOSIS — Z7984 Long term (current) use of oral hypoglycemic drugs: Secondary | ICD-10-CM | POA: Diagnosis not present

## 2017-09-07 DIAGNOSIS — M5441 Lumbago with sciatica, right side: Secondary | ICD-10-CM | POA: Diagnosis not present

## 2017-09-07 DIAGNOSIS — M5442 Lumbago with sciatica, left side: Secondary | ICD-10-CM | POA: Diagnosis not present

## 2017-09-07 DIAGNOSIS — I2510 Atherosclerotic heart disease of native coronary artery without angina pectoris: Secondary | ICD-10-CM | POA: Diagnosis not present

## 2017-09-07 DIAGNOSIS — M545 Low back pain: Secondary | ICD-10-CM | POA: Diagnosis present

## 2017-09-07 DIAGNOSIS — E1165 Type 2 diabetes mellitus with hyperglycemia: Secondary | ICD-10-CM | POA: Insufficient documentation

## 2017-09-07 DIAGNOSIS — J45909 Unspecified asthma, uncomplicated: Secondary | ICD-10-CM | POA: Insufficient documentation

## 2017-09-07 MED ORDER — HYDROMORPHONE HCL 1 MG/ML IJ SOLN
2.0000 mg | Freq: Once | INTRAMUSCULAR | Status: AC
  Administered 2017-09-07: 2 mg via INTRAMUSCULAR
  Filled 2017-09-07 (×2): qty 2

## 2017-09-07 MED ORDER — ONDANSETRON 4 MG PO TBDP
4.0000 mg | ORAL_TABLET | Freq: Once | ORAL | Status: AC
  Administered 2017-09-07: 4 mg via ORAL
  Filled 2017-09-07: qty 1

## 2017-09-07 MED ORDER — KETOROLAC TROMETHAMINE 60 MG/2ML IM SOLN
60.0000 mg | Freq: Once | INTRAMUSCULAR | Status: AC
  Administered 2017-09-07: 60 mg via INTRAMUSCULAR
  Filled 2017-09-07: qty 2

## 2017-09-07 MED ORDER — METHYLPREDNISOLONE 4 MG PO TBPK: ORAL_TABLET | ORAL | 0 refills | Status: DC

## 2017-09-07 MED ORDER — OXYCODONE-ACETAMINOPHEN 5-325 MG PO TABS: 1.0000 | ORAL_TABLET | ORAL | 0 refills | Status: DC | PRN

## 2017-09-07 NOTE — ED Provider Notes (Signed)
[facility] DEPT Provider Note   CSN: [id number] Arrival date & time: 09/07/17  1334     History   Chief Complaint Chief Complaint  Patient presents with  . Back Pain    HPI  Kerri Osborn is a 56 y.o. female with a history of rheumatoid arthritis and previous sciatica, who presents to the emergency department complaining of low back pain that started 1 hour ago while she was at work. Pain starts at midline in her low back and radiates across both buttocks and down both legs to her feet. She describes the pain as a shooting sensation that intermittently gets worse, she has not tried anything for the pain prior to arrival. Pain is made worse by forward bending. Patient reports this feels more severe than previous episodes of sciatica. Patient is able to walk but with significant pain. Denies weakness, numbness, loss of bowel/bladder function or saddle anesthesia. No history of cancer or IV drug use. Patient was seen in June for similar pain with an episode of incontinenece and had an MRI, which should worsening of arthritis but no Cauda equina.        Past Medical History:  Diagnosis Date  . Allergy   . Arthritis   . Arthritis, rheumatoid ([place])   . Asthma   . Bipolar disorder ([place])   . Chicken pox   . Coronary artery disease   . Depression   . Diabetes mellitus   . Diverticulosis   . Dyspnea 04/13/2011   pfts 03/2011:  Normal FEV1 and FEV1/FVC Ct chest 2012:  No PE, normal parenchyma [name] 04/18/2011 with active symptoms:  Normal FEV1%, mild decrease in FVC due to restriction vs airtrapping, normal appearing       flow volume loop.    . Fatty liver   . Migraines   . Myocardial infarction ([place])   . Polyarthralgia - managed by Dr. Ouida Sills per her report 02/05/2013  . 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 Seasonal allergies     Patient Active Problem List   Diagnosis Date Noted  . Type 2 diabetes mellitus with hyperglycemia, with long-term current use of  insulin ([place]) 05/02/2016  . Hyperlipidemia 04/14/2016  . Obesity 04/14/2016  . Bipolar affective disorder ([place]) 02/05/2013  . Polyarthralgia - managed by Dr. Ouida Sills per her report 02/05/2013    Past Surgical History:  Procedure Laterality Date  . ABDOMINAL HYSTERECTOMY    . APPENDECTOMY    . BREAST CYST EXCISION    . CESAREAN SECTION    . INNER EAR SURGERY     tubes in ears  . MYOMECTOMY    . TUBAL LIGATION      OB History    No data available       Home Medications    Prior to Admission medications   Medication Sig Start Date End Date Taking? Authorizing Provider  albuterol (PROVENTIL HFA;VENTOLIN HFA) 108 (90 Base) MCG/ACT inhaler Inhale 1-2 puffs into the lungs every 6 (six) hours as needed for wheezing or shortness of breath. 01/05/17   Recardo Evangelist, PA-C  Blood Glucose Monitoring Suppl (ONE TOUCH ULTRA MINI) w/Device KIT Use to check sugar daily 04/27/17   Philemon Kingdom, MD  busPIRone (BUSPAR) 15 MG tablet Take 1/2 tablet by mouth 2 times daily. 11/17/14   Philemon Kingdom, MD  cyclobenzaprine (FLEXERIL) 10 MG tablet Take 1 tablet (10 mg total) by mouth 3 (three) times daily as needed for muscle spasms. 06/08/16  Sarina Ill B, MD  glucose blood (FREESTYLE LITE) test strip Use as instructed to check 2 times daily 04/28/17   Philemon Kingdom, MD  lamoTRIgine (LAMICTAL) 100 MG tablet Take 1 tablet (100 mg total) by mouth daily. 11/17/14   Philemon Kingdom, MD  Lancets (FREESTYLE) lancets Use as instructed to check sugar 2 times daily 04/28/17   Philemon Kingdom, MD  metFORMIN (GLUCOPHAGE) 1000 MG tablet Take 1 tablet (1,000 mg total) by mouth 2 (two) times daily with a meal. 08/11/17 09/10/17  Philemon Kingdom, MD  sertraline (ZOLOFT) 100 MG tablet Take 1 tablet (100 mg total) by mouth daily. 11/17/14   Philemon Kingdom, MD    Family History Family History  Problem Relation Age of Onset  . Sudden death Mother   . Kidney failure Mother   . Sudden death  Father   . Allergies Sister   . Allergies Daughter   . Allergies Daughter   . Asthma Daughter   . Asthma Daughter   . Arthritis Maternal Grandmother   . Hyperlipidemia Maternal Grandmother   . Diabetes Maternal Grandmother   . Hypertension Maternal Aunt   . Mental illness Unknown   . Colon cancer Neg Hx   . Rectal cancer Neg Hx   . Stomach cancer Neg Hx   . Neuropathy Neg Hx     Social History Social History  Substance Use Topics  . Smoking status: Never Smoker  . Smokeless tobacco: Never Used  . Alcohol use No     Allergies   Amoxicillin   Review of Systems Review of Systems  Constitutional: Negative for chills and fever.  HENT: Negative for congestion, ear pain, rhinorrhea, sinus pressure and sore throat.   Eyes: Negative for photophobia and visual disturbance.  Respiratory: Negative for cough, chest tightness and shortness of breath.   Cardiovascular: Negative for chest pain, palpitations and leg swelling.  Gastrointestinal: Negative for abdominal pain, nausea and vomiting.  Genitourinary: Negative for difficulty urinating and dysuria.  Musculoskeletal: Positive for back pain. Negative for neck pain.  Skin: Negative for pallor and rash.  Neurological: Negative for dizziness, weakness, numbness and headaches.       Tingling on bilateral legs     Physical Exam Updated Vital Signs BP (!) 150/106 (BP Location: Right Arm)   Pulse 85   Temp 98.4 F (36.9 C) (Oral)   Resp 16   SpO2 96%   Physical Exam  Constitutional: She appears well-developed and well-nourished. No distress.  HENT:  Head: Normocephalic and atraumatic.  Eyes: Pupils are equal, round, and reactive to light. EOM are normal. Right eye exhibits no discharge. Left eye exhibits no discharge.  Cardiovascular: Normal rate, regular rhythm, normal heart sounds and intact distal pulses.   Pulmonary/Chest: Effort normal and breath sounds normal. No respiratory distress.  Abdominal: Soft. Bowel sounds  are normal. She exhibits no distension. There is no tenderness.  Good rectal tone  Musculoskeletal: She exhibits no edema or deformity.  TTP at midline lumbar spine at L5-S1 level which tracks down to the mid gluteus, spine NTTP above this level   Neurological: She is alert. Coordination normal.  Speech is clear, able to follow commands CN III-XII intact Normal strength in upper and lower extremities bilaterally including dorsiflexion and plantar flexion, strong and equal grip strength Sensation more intense of right LE than left Patient able to move minimally, likely limited by pain  Skin: Skin is warm and dry. Capillary refill takes less than 2 seconds. She is not diaphoretic.  Psychiatric: She has a normal mood and affect. Her behavior is normal.  Nursing note and vitals reviewed.    ED Treatments / Results  Labs (all labs ordered are listed, but only abnormal results are displayed) Labs Reviewed - No data to display  EKG  EKG Interpretation None       Radiology No results found.  Procedures Procedures (including critical care time)  Medications Ordered in ED Medications  ketorolac (TORADOL) injection 60 mg (60 mg Intramuscular Given 09/07/17 1537)  HYDROmorphone (DILAUDID) injection 2 mg (2 mg Intramuscular Given 09/07/17 1536)  ondansetron (ZOFRAN-ODT) disintegrating tablet 4 mg (4 mg Oral Given 09/07/17 1526)     Initial Impression / Assessment and Plan / ED Course  I have reviewed the triage vital signs and the nursing notes.  Pertinent labs & imaging results that were available during my care of the patient were reviewed by me and considered in my medical decision making (see chart for details).  Patient presents with sudden onset severe low back pain that radiates down both legs. Mildly hypertensive, vitals otherwise normal. No incontinence, patient able to walk but severely limited by pain. On exam patient's strength and mobility are limited, likely by pain, and  sensation is more intense in right leg. Given similar presentation 3 months ago with no evidence of cauda equina on MRI, will provide pain control and then repeat exam, with low threshold for MRI if exam is not significantly improved.  On re-eval patient is much improved after pain medication, patient is able to ambulate without difficulty and repeat neurologic exam there are no sensory deficits or paresthesias, strength 5/5 in bilateral low extremities, good rectal tone and patient was able to void in the ED without difficulty. Patient will be discharged home with pain medication and a short course of steroids, discussed risk of steroids with diabetes and patient expressed understanding. Patient will follow up with PCP on Monday for re-evaluation to ensure improvement. Strict return precautions given, patient expresses understanding and is in agreement with plan.  Final Clinical Impressions(s) / ED Diagnoses   Final diagnoses:  Acute bilateral low back pain with bilateral sciatica    New Prescriptions Discharge Medication List as of 09/07/2017  5:07 PM    START taking these medications   Details  methylPREDNISolone (MEDROL DOSEPAK) 4 MG TBPK tablet Take as directed, Print    oxyCODONE-acetaminophen (PERCOCET) 5-325 MG tablet Take 1 tablet by mouth every 4 (four) hours as needed., Starting Thu 09/07/2017, Print         Jacqlyn Larsen, PA-C 09/08/17 1344    Dorie Rank, MD 09/08/17 1710

## 2017-09-07 NOTE — Discharge Instructions (Signed)
Complete steroid Dosepak as directed, you may take Percocet as needed for pain in addition to your home Aleve. You need to follow-up with your primary care doctor on Monday. Return to the emergency department if you develop incontinence of bowel or bladder, are unable to walk, or back pain is not improving.

## 2017-09-07 NOTE — Telephone Encounter (Signed)
If feels needs to see [place] in interim please schedule appt.

## 2017-09-07 NOTE — ED Triage Notes (Signed)
Pt c/o back pain that started about 1 hour ago. She states that the pain is in her lower back and radiates across her buttocks and down both legs. She reports a history of rheumatoid arthritis and sciatica. A&Ox4.

## 2017-09-07 NOTE — Telephone Encounter (Signed)
Pt states was placed on the cancellation list for Rheumatology, appt is 10/20/17 so far, may be sooner.  She states pain is worsening now. She states she if grateful for being placed on the cancellation list.  Just FYI.

## 2017-09-08 ENCOUNTER — Ambulatory Visit (INDEPENDENT_AMBULATORY_CARE_PROVIDER_SITE_OTHER): Admitting: Family Medicine

## 2017-09-08 ENCOUNTER — Encounter: Payer: Self-pay | Admitting: Family Medicine

## 2017-09-08 VITALS — BP 148/80 | HR 92 | Temp 98.4°F | Wt 196.0 lb

## 2017-09-08 DIAGNOSIS — M9903 Segmental and somatic dysfunction of lumbar region: Secondary | ICD-10-CM | POA: Diagnosis not present

## 2017-09-08 DIAGNOSIS — M545 Low back pain, unspecified: Secondary | ICD-10-CM

## 2017-09-08 DIAGNOSIS — M9905 Segmental and somatic dysfunction of pelvic region: Secondary | ICD-10-CM

## 2017-09-08 MED ORDER — CYCLOBENZAPRINE HCL 10 MG PO TABS: 10.0000 mg | ORAL_TABLET | Freq: Every evening | ORAL | 0 refills | Status: DC | PRN

## 2017-09-08 NOTE — Progress Notes (Signed)
HPI:  Kerri Osborn is a pleasant 56 year old with a past medical history significant for rheumatoid arthritis and degenerative disc disease, back pain and sciatica, here for a follow-up following an emergency room visit yesterday for back pain. She had an MRI earlier this year for this pain. She does have degenerative disc disease mild-mod  lumbar facet arthropathy without canal stenosis. She has been for referred to rheumatology for rheumatoid arthritis. Today she reports she had acute onset of right low back pain with radiation to the right buttock and some dull left buttocks yesterday. Reports this was severe and similar to her prior episode of sciatica. She went to the emergency room and was given a pain medication, Medrol Dosepak and an opioid pain medication to take home. She has not started the steroid and only took one of the pain pills. She does not want to take opioids. She has used muscle relaxers in the past, but she is out of these now. Today she feels a little bit better, but reports she still has moderate to severe pain in the right low back. No radiation of symptoms today. Denies fevers, malaise, weakness, numbness, bowel or bladder function changes.  ROS: See pertinent positives and negatives per HPI.  Past Medical History:  Diagnosis Date  . Allergy   . Arthritis   . Arthritis, rheumatoid ([place])   . Asthma   . Bipolar disorder ([place])   . Chicken pox   . Coronary artery disease   . Depression   . Diabetes mellitus   . Diverticulosis   . Dyspnea 04/13/2011   pfts 03/2011:  Normal FEV1 and FEV1/FVC Ct chest 2012:  No PE, normal parenchyma [name] 04/18/2011 with active symptoms:  Normal FEV1%, mild decrease in FVC due to restriction vs airtrapping, normal appearing       flow volume loop.    . Fatty liver   . Migraines   . Myocardial infarction ([place])   . Polyarthralgia - managed by Dr. Ouida Sills per her report 02/05/2013  . 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  Seasonal allergies     Past Surgical History:  Procedure Laterality Date  . ABDOMINAL HYSTERECTOMY    . APPENDECTOMY    . BREAST CYST EXCISION    . CESAREAN SECTION    . INNER EAR SURGERY     tubes in ears  . MYOMECTOMY    . TUBAL LIGATION      Family History  Problem Relation Age of Onset  . Sudden death Mother   . Kidney failure Mother   . Sudden death Father   . Allergies Sister   . Allergies Daughter   . Allergies Daughter   . Asthma Daughter   . Asthma Daughter   . Arthritis Maternal Grandmother   . Hyperlipidemia Maternal Grandmother   . Diabetes Maternal Grandmother   . Hypertension Maternal Aunt   . Mental illness Unknown   . Colon cancer Neg Hx   . Rectal cancer Neg Hx   . Stomach cancer Neg Hx   . Neuropathy Neg Hx     Social History   Social History  . Marital status: Widowed    Spouse name: N/A  . Number of children: 2  . Years of education: 16   Occupational History  . [employer]     Social History Main Topics  . Smoking status: Never Smoker  . Smokeless tobacco: Never Used  . Alcohol use No  . Drug use:  No  . Sexual activity: Not Asked   Other Topics Concern  . 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      Spiritual Beliefs:       Lifestyle: MWF does kickboxing, vegetarian - trying to go vegan      Caffeine use: Drinks tea rarely                     Current Outpatient Prescriptions:  .  albuterol (PROVENTIL HFA;VENTOLIN HFA) 108 (90 Base) MCG/ACT inhaler, Inhale 1-2 puffs into the lungs every 6 (six) hours as needed for wheezing or shortness of breath., Disp: 1 Inhaler, Rfl: 0 .  Blood Glucose Monitoring Suppl (ONE TOUCH ULTRA MINI) w/Device KIT, Use to check sugar daily, Disp: 1 each, Rfl: 0 .  busPIRone (BUSPAR) 15 MG tablet, Take 1/2 tablet by mouth 2 times daily., Disp: 90 tablet, Rfl: 1 .  cyclobenzaprine (FLEXERIL) 10 MG  tablet, Take 1 tablet (10 mg total) by mouth 3 (three) times daily as needed for muscle spasms., Disp: 90 tablet, Rfl: 3 .  glucose blood (FREESTYLE LITE) test strip, Use as instructed to check 2 times daily, Disp: 200 each, Rfl: 5 .  lamoTRIgine (LAMICTAL) 100 MG tablet, Take 1 tablet (100 mg total) by mouth daily., Disp: 90 tablet, Rfl: 1 .  Lancets (FREESTYLE) lancets, Use as instructed to check sugar 2 times daily, Disp: 200 each, Rfl: 5 .  metFORMIN (GLUCOPHAGE) 1000 MG tablet, Take 1 tablet (1,000 mg total) by mouth 2 (two) times daily with a meal., Disp: 180 tablet, Rfl: 3 .  methylPREDNISolone (MEDROL DOSEPAK) 4 MG TBPK tablet, Take as directed, Disp: 21 tablet, Rfl: 0 .  oxyCODONE-acetaminophen (PERCOCET) 5-325 MG tablet, Take 1 tablet by mouth every 4 (four) hours as needed., Disp: 16 tablet, Rfl: 0 .  sertraline (ZOLOFT) 100 MG tablet, Take 1 tablet (100 mg total) by mouth daily., Disp: 90 tablet, Rfl: 1  EXAM:  Vitals:   09/08/17 1417  BP: (!) 148/80  Pulse: 92  Temp: 98.4 F (36.9 C)  SpO2: 97%    Body mass index is 33.12 kg/m.  GENERAL: vitals reviewed and listed above, alert, oriented, appears well hydrated and in no acute distress  HEENT: atraumatic, conjunttiva clear, no obvious abnormalities on inspection of external nose and ears  NECK: no obvious masses on inspection  LUNGS: clear to auscultation bilaterally, no wheezes, rales or rhonchi, good air movement  CV: HRRR, no peripheral edema  MS: moves all extremities without noticeable abnormality, on inspection of the back, hips and lower extremities her right shoulder is mildly elevated compared to the left, her right hip is mildly elevated compared to the left, she has a tender point at right L3, with L3 flexed rotated right side bent right, she has significant tension particularly in the right lumbar paraspinal muscles, right piriformis tender point, normal DTRs/strength and sensitivity to light touch in the  bilateral lower extremities today, negative straight leg raising test and cross leg regular test bilaterally  PSYCH: pleasant and cooperative, no obvious depression or anxiety  ASSESSMENT AND PLAN:  Discussed the following assessment and plan:  Acute right-sided low back pain without sciatica  Somatic dysfunction of spine, lumbar  Somatic dysfunction of pelvis region  -right low back pain with known rheumatoid arthritis ( will be seeing rheumatology for this) and known degenerative  disc disease with MRI done recently - results reviewed with patient -She is feeling somewhat better today, yet has not started any medications, but still was in significant pain when she arrived -We discussed various treatment options and she wanted to try osteopathic treatments today - she tolerated gentle treatments with counterstrain quite well today with actually almost complete resolution of her symptoms in the exam room. She was very appreciative and wants to do further treatments. Advised if her symptoms should recur over the next few days she could take the prednisone dose pack that was prescribed in the emergency room. Also refilled her Flexeril. Also provided some very gentle exercises for strengthening and support from the low back and core. -Patient advised to return or notify a doctor immediately if symptoms worsen or persist or new concerns arise.  PROCEDURE NOTE : OSTEOPATHIC TREATMENT The decision today to treat with gentle Osteopathic Manipulative Therapy  (OMT) was based on physical exam findings. Verbal consent was  obtained after after explanation of risks and benefits. No Cervical HVLA manipulation was performed. After consent was obtained, treatment was  performed as below:      Regions treated: Lumbar     Techniques used: counterstrain, MR The patient tolerated the treatment well and reported dramatically Improved  symptoms following treatment today. Follow up treatment was  advised in: 1-2 weeks   Patient Instructions  BEFORE YOU LEAVE: -recheck blood pressure before leaving -follow up: 1-2 weeks for OMT (30 minutes)  Heat for 15 minutes  Twice daily  If any further symptoms can take the prednisone per instructions.  For pain can try heat or ice, topical menthol (tiger balm), low dose aleve or tylenol not more then 1-2 times daily.   I am glad you are feeling better! Follow up sooner as needed.  Also check out [employer]" which is a program developed by Dr. Minerva Ends.  There are links to a couple of his YouTube Videos below and I would like to see performing one of his videos 3-4  days per week.    A good intro video is: "Independence from Pain 7-minute Video" - [email or web address]    Colin Benton R., DO

## 2017-09-08 NOTE — Telephone Encounter (Signed)
I did advise patient of this yesterday. She voiced understanding and agreed with plan

## 2017-09-08 NOTE — Patient Instructions (Signed)
BEFORE YOU LEAVE: -recheck blood pressure before leaving -follow up: 1-2 weeks for OMT (30 minutes)  Heat for 15 minutes  Twice daily  If any further symptoms can take the prednisone per instructions.  For pain can try heat or ice, topical menthol (tiger balm), low dose aleve or tylenol not more then 1-2 times daily.   I am glad you are feeling better! Follow up sooner as needed.  Also check out [employer]" which is a program developed by Dr. Minerva Ends.  There are links to a couple of his YouTube Videos below and I would like to see performing one of his videos 3-4  days per week.    A good intro video is: "Independence from Pain 7-minute Video" - [email or web address]

## 2017-09-20 NOTE — Progress Notes (Signed)
HPI:   Follow up R low back pain with somatic dysfunction and hypertension. Requesting OMT today. Had remarkable relief following last treatment. Provided home care pain rehab program, conservative management pain. Seeing rheumatologist for RA.  Reports she can even believe how much better she feels after the treatment at the last visit.reports had almost immediate and almost complete resolution of her back pain. Occasionally feels a small amount of pain in the right low back if on her feet for a long time or a lot of activity. Denies radiation, weakness, numbness. She has occasional migraines. This is chronic. Unchanged. Had a headache today. ROS: See pertinent positives and negatives per HPI.  Past Medical History:  Diagnosis Date  . Allergy   . Arthritis   . Arthritis, rheumatoid ([place])   . Asthma   . Bipolar disorder ([place])   . Chicken pox   . Coronary artery disease   . Depression   . Diabetes mellitus   . Diverticulosis   . Dyspnea 04/13/2011   pfts 03/2011:  Normal FEV1 and FEV1/FVC Ct chest 2012:  No PE, normal parenchyma [name] 04/18/2011 with active symptoms:  Normal FEV1%, mild decrease in FVC due to restriction vs airtrapping, normal appearing       flow volume loop.    . Fatty liver   . Migraines   . Myocardial infarction ([place])   . Polyarthralgia - managed by Dr. Ouida Sills per her report 02/05/2013  . 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 Seasonal allergies     Past Surgical History:  Procedure Laterality Date  . ABDOMINAL HYSTERECTOMY    . APPENDECTOMY    . BREAST CYST EXCISION    . CESAREAN SECTION    . INNER EAR SURGERY     tubes in ears  . MYOMECTOMY    . TUBAL LIGATION      Family History  Problem Relation Age of Onset  . Sudden death Mother   . Kidney failure Mother   . Sudden death Father   . Allergies Sister   . Allergies Daughter   . Allergies Daughter   . Asthma Daughter   . Asthma Daughter   . Arthritis Maternal Grandmother   .  Hyperlipidemia Maternal Grandmother   . Diabetes Maternal Grandmother   . Hypertension Maternal Aunt   . Mental illness Unknown   . Colon cancer Neg Hx   . Rectal cancer Neg Hx   . Stomach cancer Neg Hx   . Neuropathy Neg Hx     Social History   Social History  . Marital status: Widowed    Spouse name: N/A  . Number of children: 2  . Years of education: 16   Occupational History  . [employer]     Social History Main Topics  . Smoking status: Never Smoker  . Smokeless tobacco: Never Used  . Alcohol use No  . Drug use: No  . Sexual activity: Not Asked   Other Topics Concern  . 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      Spiritual Beliefs:       Lifestyle: MWF does kickboxing, vegetarian - trying to go vegan      Caffeine use: Drinks tea rarely                     Current Outpatient  Prescriptions:  .  albuterol (PROVENTIL HFA;VENTOLIN HFA) 108 (90 Base) MCG/ACT inhaler, Inhale 1-2 puffs into the lungs every 6 (six) hours as needed for wheezing or shortness of breath., Disp: 1 Inhaler, Rfl: 0 .  Blood Glucose Monitoring Suppl (ONE TOUCH ULTRA MINI) w/Device KIT, Use to check sugar daily, Disp: 1 each, Rfl: 0 .  busPIRone (BUSPAR) 15 MG tablet, Take 1/2 tablet by mouth 2 times daily., Disp: 90 tablet, Rfl: 1 .  cyclobenzaprine (FLEXERIL) 10 MG tablet, Take 1 tablet (10 mg total) by mouth at bedtime as needed for muscle spasms., Disp: 20 tablet, Rfl: 0 .  glucose blood (FREESTYLE LITE) test strip, Use as instructed to check 2 times daily, Disp: 200 each, Rfl: 5 .  lamoTRIgine (LAMICTAL) 100 MG tablet, Take 1 tablet (100 mg total) by mouth daily., Disp: 90 tablet, Rfl: 1 .  Lancets (FREESTYLE) lancets, Use as instructed to check sugar 2 times daily, Disp: 200 each, Rfl: 5 .  methylPREDNISolone (MEDROL DOSEPAK) 4 MG TBPK tablet, Take as directed, Disp: 21 tablet, Rfl:  0 .  oxyCODONE-acetaminophen (PERCOCET) 5-325 MG tablet, Take 1 tablet by mouth every 4 (four) hours as needed., Disp: 16 tablet, Rfl: 0 .  sertraline (ZOLOFT) 100 MG tablet, Take 1 tablet (100 mg total) by mouth daily., Disp: 90 tablet, Rfl: 1 .  metFORMIN (GLUCOPHAGE) 1000 MG tablet, Take 1 tablet (1,000 mg total) by mouth 2 (two) times daily with a meal., Disp: 180 tablet, Rfl: 3  EXAM:  Vitals:   09/21/17 0921  BP: 122/80  Pulse: 83  Temp: 98.3 F (36.8 C)    Body mass index is 32.38 kg/m.  GENERAL: vitals reviewed and listed above, alert, oriented, appears well hydrated and in no acute distress  HEENT: atraumatic, conjunttiva clear, no obvious abnormalities on inspection of external nose and ears  NECK: no obvious masses on inspection  LUNGS: clear to auscultation bilaterally, no wheezes, rales or rhonchi, good air movement  CV: HRRR, no peripheral edema  MS: moves all extremities without noticeable abnormality, normal gait, osteopathic findings include right side bending rotation cranial strain, suboccipital muscle tension with OA rotated left, thoracic inlet rotated left, right upper thoracic restriction inhalation, right L3 lumbar tender points, right on left sacral torsion  PSYCH: pleasant and cooperative, no obvious depression or anxiety  ASSESSMENT AND PLAN:  Discussed the following assessment and plan:  Right-sided low back pain without sciatica, unspecified chronicity  Other migraine without status migrainosus, not intractable  Somatic dysfunction of head region  Somatic dysfunction of cervical region  Somatic dysfunction of thoracic region  Somatic dysfunction of rib cage region  Somatic dysfunction of lumbar region  Somatic dysfunction of sacral region  PROCEDURE NOTE : OSTEOPATHIC TREATMENT The decision today to treat with gentle Osteopathic Manipulative Therapy  (OMT) was based on physical exam findings. Verbal consent was  obtained after after  explanation of risks and benefits. No Cervical HVLA Manipulation, rotation of the head and neck during treatment for any aggressive treatments of the spine or head and neck were performed. After consent was obtained, treatment was  performed as below:      Regions treated: Head, cervical, thoracic, rib cage, lumbar, sacral     Techniques used: cranial therapy, balance to membranous tension, myofascial release, counter strain The patient tolerated the treatment well and reported Improved  symptoms following treatment today. She will followed the minor back pain and the headache she had resolved after treatment today. Follow up treatment was advised  in: 1-2 weeks  -Patient advised to return or notify a doctor immediately if symptoms worsen or persist or new concerns arise.  Patient Instructions  BEFORE YOU LEAVE: -follow up: regular follow up as scheduled -OMT in 2 weeks    Colin Benton R., DO

## 2017-09-21 ENCOUNTER — Ambulatory Visit (INDEPENDENT_AMBULATORY_CARE_PROVIDER_SITE_OTHER): Admitting: Family Medicine

## 2017-09-21 ENCOUNTER — Encounter: Payer: Self-pay | Admitting: Family Medicine

## 2017-09-21 VITALS — BP 122/80 | HR 83 | Temp 98.3°F | Ht 64.5 in | Wt 191.6 lb

## 2017-09-21 DIAGNOSIS — M9900 Segmental and somatic dysfunction of head region: Secondary | ICD-10-CM | POA: Diagnosis not present

## 2017-09-21 DIAGNOSIS — M9901 Segmental and somatic dysfunction of cervical region: Secondary | ICD-10-CM

## 2017-09-21 DIAGNOSIS — M545 Low back pain, unspecified: Secondary | ICD-10-CM

## 2017-09-21 DIAGNOSIS — M9908 Segmental and somatic dysfunction of rib cage: Secondary | ICD-10-CM | POA: Diagnosis not present

## 2017-09-21 DIAGNOSIS — M9904 Segmental and somatic dysfunction of sacral region: Secondary | ICD-10-CM

## 2017-09-21 DIAGNOSIS — G43809 Other migraine, not intractable, without status migrainosus: Secondary | ICD-10-CM

## 2017-09-21 DIAGNOSIS — M9902 Segmental and somatic dysfunction of thoracic region: Secondary | ICD-10-CM

## 2017-09-21 DIAGNOSIS — M9903 Segmental and somatic dysfunction of lumbar region: Secondary | ICD-10-CM

## 2017-09-21 NOTE — Patient Instructions (Addendum)
BEFORE YOU LEAVE: -follow up: regular follow up as scheduled -OMT in 2 weeks

## 2017-09-27 NOTE — Progress Notes (Deleted)
HPI:      ROS: See pertinent positives and negatives per HPI.  Past Medical History:  Diagnosis Date  . Allergy   . Arthritis   . Arthritis, rheumatoid ([place])   . Asthma   . Bipolar disorder ([place])   . Chicken pox   . Coronary artery disease   . Depression   . Diabetes mellitus   . Diverticulosis   . Dyspnea 04/13/2011   pfts 03/2011:  Normal FEV1 and FEV1/FVC Ct chest 2012:  No PE, normal parenchyma [name] 04/18/2011 with active symptoms:  Normal FEV1%, mild decrease in FVC due to restriction vs airtrapping, normal appearing       flow volume loop.    . Fatty liver   . Migraines   . Myocardial infarction ([place])   . Polyarthralgia - managed by Dr. Ouida Sills per her report 02/05/2013  . 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 Seasonal allergies     Past Surgical History:  Procedure Laterality Date  . ABDOMINAL HYSTERECTOMY    . APPENDECTOMY    . BREAST CYST EXCISION    . CESAREAN SECTION    . INNER EAR SURGERY     tubes in ears  . MYOMECTOMY    . TUBAL LIGATION      Family History  Problem Relation Age of Onset  . Sudden death Mother   . Kidney failure Mother   . Sudden death Father   . Allergies Sister   . Allergies Daughter   . Allergies Daughter   . Asthma Daughter   . Asthma Daughter   . Arthritis Maternal Grandmother   . Hyperlipidemia Maternal Grandmother   . Diabetes Maternal Grandmother   . Hypertension Maternal Aunt   . Mental illness Unknown   . Colon cancer Neg Hx   . Rectal cancer Neg Hx   . Stomach cancer Neg Hx   . Neuropathy Neg Hx     Social History   Social History  . Marital status: Widowed    Spouse name: N/A  . Number of children: 2  . Years of education: 16   Occupational History  . [employer]     Social History Main Topics  . Smoking status: Never Smoker  . Smokeless tobacco: Never Used  . Alcohol use No  . Drug use: No  . Sexual activity: Not on file   Other Topics Concern  . 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      Spiritual Beliefs:       Lifestyle: MWF does kickboxing, vegetarian - trying to go vegan      Caffeine use: Drinks tea rarely                     Current Outpatient Prescriptions:  .  albuterol (PROVENTIL HFA;VENTOLIN HFA) 108 (90 Base) MCG/ACT inhaler, Inhale 1-2 puffs into the lungs every 6 (six) hours as needed for wheezing or shortness of breath., Disp: 1 Inhaler, Rfl: 0 .  Blood Glucose Monitoring Suppl (ONE TOUCH ULTRA MINI) w/Device KIT, Use to check sugar daily, Disp: 1 each, Rfl: 0 .  busPIRone (BUSPAR) 15 MG tablet, Take 1/2 tablet by mouth 2 times daily., Disp: 90 tablet, Rfl: 1 .  cyclobenzaprine (FLEXERIL) 10 MG tablet, Take 1 tablet (10 mg total) by mouth at bedtime as needed  for muscle spasms., Disp: 20 tablet, Rfl: 0 .  glucose blood (FREESTYLE LITE) test strip, Use as instructed to check 2 times daily, Disp: 200 each, Rfl: 5 .  lamoTRIgine (LAMICTAL) 100 MG tablet, Take 1 tablet (100 mg total) by mouth daily., Disp: 90 tablet, Rfl: 1 .  Lancets (FREESTYLE) lancets, Use as instructed to check sugar 2 times daily, Disp: 200 each, Rfl: 5 .  metFORMIN (GLUCOPHAGE) 1000 MG tablet, Take 1 tablet (1,000 mg total) by mouth 2 (two) times daily with a meal., Disp: 180 tablet, Rfl: 3 .  methylPREDNISolone (MEDROL DOSEPAK) 4 MG TBPK tablet, Take as directed, Disp: 21 tablet, Rfl: 0 .  oxyCODONE-acetaminophen (PERCOCET) 5-325 MG tablet, Take 1 tablet by mouth every 4 (four) hours as needed., Disp: 16 tablet, Rfl: 0 .  sertraline (ZOLOFT) 100 MG tablet, Take 1 tablet (100 mg total) by mouth daily., Disp: 90 tablet, Rfl: 1  EXAM:  There were no vitals filed for this visit.  There is no height or weight on file to calculate BMI.  GENERAL: vitals reviewed and listed above, alert, oriented, appears well hydrated and in no acute distress  HEENT: atraumatic,  conjunttiva clear, no obvious abnormalities on inspection of external nose and ears  NECK: no obvious masses on inspection  LUNGS: clear to auscultation bilaterally, no wheezes, rales or rhonchi, good air movement  CV: HRRR, no peripheral edema  MS: moves all extremities without noticeable abnormality  PSYCH: pleasant and cooperative, no obvious depression or anxiety  ASSESSMENT AND PLAN:  Discussed the following assessment and plan:  No diagnosis found.  -Patient advised to return or notify a doctor immediately if symptoms worsen or persist or new concerns arise.  There are no Patient Instructions on file for this visit.  Colin Benton R., DO

## 2017-09-28 ENCOUNTER — Ambulatory Visit: Admitting: Family Medicine

## 2017-09-28 ENCOUNTER — Encounter: Payer: Self-pay | Admitting: Family Medicine

## 2017-09-28 NOTE — Progress Notes (Signed)
Chaniyah Jahr did not show up for her follow up appointment today. On review of chart/precharting prior to appointment: Due for eye exam, foot exam, mammo, microalb, hiv screening, CPE  DM type 2, uncontrolled, with neuropathy ([place]) -managed by endo  -meds: metformin  Hyperlipemia/Obesity -meds: poor compliance with recommended tx in the past -advised to replace coconut oil with good fats and take statin in the past - does not take statin  Polyarthralgia: -sees rheumatologist whom retired - establishing with ____ -meds: gabapentin, percocet, lamital, hydroxychloroquine in the past - now off all meds  Bipolar disorder -Managed by psychiatrist - [facility] -meds: lamictal, buspar and zoloft  Chronic low back pain and leg pain: -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 Leonarda Salon in the past, saw Dr. Jaynee Eagles in 2017 with repeat MRI, referred to Dr. Nelva Bush 2017, repeat MRI in 2018 -she has been doing OMT recently with almost complete resolution of symptoms

## 2017-10-06 ENCOUNTER — Encounter: Payer: Self-pay | Admitting: Family Medicine

## 2017-10-06 ENCOUNTER — Ambulatory Visit (INDEPENDENT_AMBULATORY_CARE_PROVIDER_SITE_OTHER): Admitting: Family Medicine

## 2017-10-06 VITALS — BP 112/72 | HR 93 | Temp 98.2°F | Ht 64.5 in | Wt 193.0 lb

## 2017-10-06 DIAGNOSIS — M9903 Segmental and somatic dysfunction of lumbar region: Secondary | ICD-10-CM | POA: Diagnosis not present

## 2017-10-06 DIAGNOSIS — M9901 Segmental and somatic dysfunction of cervical region: Secondary | ICD-10-CM | POA: Diagnosis not present

## 2017-10-06 DIAGNOSIS — M545 Low back pain, unspecified: Secondary | ICD-10-CM

## 2017-10-06 DIAGNOSIS — M9902 Segmental and somatic dysfunction of thoracic region: Secondary | ICD-10-CM | POA: Diagnosis not present

## 2017-10-06 DIAGNOSIS — M9904 Segmental and somatic dysfunction of sacral region: Secondary | ICD-10-CM | POA: Diagnosis not present

## 2017-10-06 DIAGNOSIS — M9900 Segmental and somatic dysfunction of head region: Secondary | ICD-10-CM

## 2017-10-06 DIAGNOSIS — G8929 Other chronic pain: Secondary | ICD-10-CM | POA: Diagnosis not present

## 2017-10-06 DIAGNOSIS — M9906 Segmental and somatic dysfunction of lower extremity: Secondary | ICD-10-CM

## 2017-10-06 NOTE — Patient Instructions (Signed)
BEFORE YOU LEAVE: -follow up: 1-2 weeks for OMT

## 2017-10-06 NOTE — Progress Notes (Signed)
HPI:  Kerri Osborn is a pleasant 56 year old here for osteopathic treatments for her low back pain. She also has history of rheumatoid arthritis.She has an appointment with a back specialist on November 4. However most of her symptoms have resolved with osteopathic treatments. She reports she had a minor twinge of pain in the right low back yesterday, this resolved with taking 1 ALeve. She reports today she feels a little tightness in the right low back, but otherwise feels well. Denies radiation of pain, weakness, numbness, bowel or bladder dysfunction.   ROS: See pertinent positives and negatives per HPI.  Past Medical History:  Diagnosis Date  . Allergy   . Arthritis   . Arthritis, rheumatoid ([place])   . Asthma   . Bipolar disorder ([place])   . Chicken pox   . Coronary artery disease   . Depression   . Diabetes mellitus   . Diverticulosis   . Dyspnea 04/13/2011   pfts 03/2011:  Normal FEV1 and FEV1/FVC Ct chest 2012:  No PE, normal parenchyma [name] 04/18/2011 with active symptoms:  Normal FEV1%, mild decrease in FVC due to restriction vs airtrapping, normal appearing       flow volume loop.    . Fatty liver   . Migraines   . Myocardial infarction ([place])   . Polyarthralgia - managed by Dr. Ouida Sills per her report 02/05/2013  . 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 Seasonal allergies     Past Surgical History:  Procedure Laterality Date  . ABDOMINAL HYSTERECTOMY    . APPENDECTOMY    . BREAST CYST EXCISION    . CESAREAN SECTION    . INNER EAR SURGERY     tubes in ears  . MYOMECTOMY    . TUBAL LIGATION      Family History  Problem Relation Age of Onset  . Sudden death Mother   . Kidney failure Mother   . Sudden death Father   . Allergies Sister   . Allergies Daughter   . Allergies Daughter   . Asthma Daughter   . Asthma Daughter   . Arthritis Maternal Grandmother   . Hyperlipidemia Maternal Grandmother   . Diabetes Maternal Grandmother   . Hypertension  Maternal Aunt   . Mental illness Unknown   . Colon cancer Neg Hx   . Rectal cancer Neg Hx   . Stomach cancer Neg Hx   . Neuropathy Neg Hx     Social History   Social History  . Marital status: Widowed    Spouse name: N/A  . Number of children: 2  . Years of education: 16   Occupational History  . [employer]     Social History Main Topics  . Smoking status: Never Smoker  . Smokeless tobacco: Never Used  . Alcohol use No  . Drug use: No  . Sexual activity: Not Asked   Other Topics Concern  . 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      Spiritual Beliefs:       Lifestyle: MWF does kickboxing, vegetarian - trying to go vegan      Caffeine use: Drinks tea rarely                     Current Outpatient Prescriptions:  .  albuterol (PROVENTIL HFA;VENTOLIN HFA) 108 (90 Base)  MCG/ACT inhaler, Inhale 1-2 puffs into the lungs every 6 (six) hours as needed for wheezing or shortness of breath., Disp: 1 Inhaler, Rfl: 0 .  Blood Glucose Monitoring Suppl (ONE TOUCH ULTRA MINI) w/Device KIT, Use to check sugar daily, Disp: 1 each, Rfl: 0 .  busPIRone (BUSPAR) 15 MG tablet, Take 1/2 tablet by mouth 2 times daily., Disp: 90 tablet, Rfl: 1 .  cyclobenzaprine (FLEXERIL) 10 MG tablet, Take 1 tablet (10 mg total) by mouth at bedtime as needed for muscle spasms., Disp: 20 tablet, Rfl: 0 .  glucose blood (FREESTYLE LITE) test strip, Use as instructed to check 2 times daily, Disp: 200 each, Rfl: 5 .  lamoTRIgine (LAMICTAL) 100 MG tablet, Take 1 tablet (100 mg total) by mouth daily., Disp: 90 tablet, Rfl: 1 .  Lancets (FREESTYLE) lancets, Use as instructed to check sugar 2 times daily, Disp: 200 each, Rfl: 5 .  methylPREDNISolone (MEDROL DOSEPAK) 4 MG TBPK tablet, Take as directed, Disp: 21 tablet, Rfl: 0 .  oxyCODONE-acetaminophen (PERCOCET) 5-325 MG tablet, Take 1 tablet by mouth every 4  (four) hours as needed., Disp: 16 tablet, Rfl: 0 .  sertraline (ZOLOFT) 100 MG tablet, Take 1 tablet (100 mg total) by mouth daily., Disp: 90 tablet, Rfl: 1 .  metFORMIN (GLUCOPHAGE) 1000 MG tablet, Take 1 tablet (1,000 mg total) by mouth 2 (two) times daily with a meal., Disp: 180 tablet, Rfl: 3  EXAM:  Vitals:   10/06/17 1617  BP: 112/72  Pulse: 93  Temp: 98.2 F (36.8 C)    Body mass index is 32.62 kg/m.  GENERAL: vitals reviewed and listed above, alert, oriented, appears well hydrated and in no acute distress  MS: moves all extremities without noticeable abnormality, on standing inspection her ears are about 1 inch forward over her shoulders, OA is rotated right, mandible is deviated left, thoracic inlet is rotated right, left L4 posterior lateral tender point, left on right sacral torsion, left leg length mildly shorter discrepancy, left anterior innominate, right tib-fib internally rotated, NV intact distal  PSYCH: pleasant and cooperative, no obvious depression or anxiety  ASSESSMENT AND PLAN:  Discussed the following assessment and plan:  Chronic right-sided low back pain without sciatica  Somatic dysfunction of head region  Somatic dysfunction of cervical region  Somatic dysfunction of thoracic region  Somatic dysfunction of lumbar region  Somatic dysfunction of sacral region  Somatic dysfunction of lower extremity  -She has an appointment with her specialists coming up -Requiring very little pain medications at this point -She wishes to continue with osteopathic treatments and wants to do a treatment today and in several weeks -Advised continued healthy diet, regular exercise and keeping appointments with specialists -Osteopathic treatment per below  PROCEDURE NOTE : OSTEOPATHIC TREATMENT The decision today to treat with gentle Osteopathic Manipulative Therapy  (OMT) was based on physical exam findings inpatient wishes. Verbal consent was  obtained after  after explanation of risks and benefits. No Cervical HVLA manipulation was performed. After consent was obtained, treatment was  performed as below:      Regions treated:  Head, cervical, thoracic, lumbar, sacral, lower extremity     Techniques used: MR, muscle energy, counterstrain, cranial osteopathy The patient tolerated the treatment well and reported Improved  symptoms following treatment today. Follow up treatment was advised in: 1-2 weeks  -Patient advised to return or notify a doctor immediately if symptoms worsen or persist or new concerns arise.  Patient Instructions  BEFORE YOU LEAVE: -follow up: 1-2  weeks for OMT    Jeyden Coffelt, Jarrett Soho R., DO

## 2017-10-16 ENCOUNTER — Telehealth: Payer: Self-pay | Admitting: Family Medicine

## 2017-10-16 NOTE — Telephone Encounter (Signed)
Copied from [place] #[id number]. Topic: General - Other >> Oct 16, 2017 10:58 AM Suggs, Tammy E wrote: Reason for CRM: Patient wants to know if pcp has heard back from the Rheumatologist.  Patient is requesting a call back. >> Oct 16, 2017  1:38 PM Lucretia Kern, DO wrote: I am not sure what she is requesting. Wendie Simmer, can you check? Thanks.

## 2017-10-16 NOTE — Telephone Encounter (Signed)
I left a message for the pt to return my call. 

## 2017-10-16 NOTE — Telephone Encounter (Signed)
Patient called back and stated she spoke with someone at [facility] and they told her she does not have an anti-inflammatory problem to be treated by them after her labs from last week were normal.  Patient stated they told her she should see her PCP for any problems and she is confused as to what to do for her knee pain, etc?  Also stated they are awaiting notes from [facility] as she was seen by Dr Nelva Bush before.  Message sent to Dr Maudie Mercury.

## 2017-10-17 ENCOUNTER — Ambulatory Visit: Admitting: Family Medicine

## 2017-10-17 NOTE — Telephone Encounter (Signed)
I called the pt and informed her of the message below

## 2017-10-17 NOTE — Telephone Encounter (Signed)
I did not receive those labs yet. That is good news though if she tested negative for rheumatological disease. She should follow up with Dr. Nelva Bush about her back, and he may be able to recommend somebody in his practice for her knee issues. I know her back is doing much better now, but it may still be a good idea to be plugged in with Dr. Nelva Bush NK she has trouble with it again in the future.

## 2017-11-14 ENCOUNTER — Ambulatory Visit: Admitting: Internal Medicine

## 2017-11-15 DIAGNOSIS — J45909 Unspecified asthma, uncomplicated: Secondary | ICD-10-CM | POA: Insufficient documentation

## 2017-11-15 LAB — HM DIABETES EYE EXAM

## 2017-11-15 NOTE — Progress Notes (Signed)
HPI:  Acute visit for "asthma": -Reported mild intermittent asthma for many many years -Usually simply uses her albuterol as needed, reports occasionally she needs to escalate this to an inhaled steroid for a few weeks -Reports for the last week has had a little postnasal drip, runny nose, cough and some wheezing -Has been using her albuterol several times a day, feels like she is at the point steroid -Denies fevers, shortness of breath, hemoptysis, thick or excessive sputum  Need labs from [place] rheum. Due for hiv screen, eye exam, mammo, foot exam, urine microalb, flu shot -Reports had her eye exam and was told no retinopathy -After her mammogram -Flu shot -Plans to do her foot exam with her diabetes doctor, reports she rescheduled this visit  Told by [place] rheum in 2018 that she does not have RA.  ROS: See pertinent positives and negatives per HPI.  Past Medical History:  Diagnosis Date  . Allergy   . Arthritis   . Arthritis, rheumatoid ([place])   . Asthma   . Bipolar disorder ([place])   . Chicken pox   . Coronary artery disease   . Depression   . Diabetes mellitus   . Diverticulosis   . Dyspnea 04/13/2011   pfts 03/2011:  Normal FEV1 and FEV1/FVC Ct chest 2012:  No PE, normal parenchyma [name] 04/18/2011 with active symptoms:  Normal FEV1%, mild decrease in FVC due to restriction vs airtrapping, normal appearing       flow volume loop.    . Fatty liver   . Migraines   . Myocardial infarction ([place])   . Polyarthralgia - managed by Dr. Ouida Sills per her report 02/05/2013  . 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 Seasonal allergies     Past Surgical History:  Procedure Laterality Date  . ABDOMINAL HYSTERECTOMY    . APPENDECTOMY    . BREAST CYST EXCISION    . CESAREAN SECTION    . INNER EAR SURGERY     tubes in ears  . MYOMECTOMY    . TUBAL LIGATION      Family History  Problem Relation Age of Onset  . Sudden death Mother   . Kidney failure Mother   . Sudden death  Father   . Allergies Sister   . Allergies Daughter   . Allergies Daughter   . Asthma Daughter   . Asthma Daughter   . Arthritis Maternal Grandmother   . Hyperlipidemia Maternal Grandmother   . Diabetes Maternal Grandmother   . Hypertension Maternal Aunt   . Mental illness Unknown   . Colon cancer Neg Hx   . Rectal cancer Neg Hx   . Stomach cancer Neg Hx   . Neuropathy Neg Hx     Social History   Socioeconomic History  . Marital status: Widowed    Spouse name: None  . Number of children: 2  . Years of education: 44  . Highest education level: None  Social Needs  . Financial resource strain: None  . Food insecurity - worry: None  . Food insecurity - inability: None  . Transportation needs - medical: None  . Transportation needs - non-medical: None  Occupational History  . Occupation: [employer]   Tobacco Use  . Smoking status: Never Smoker  . Smokeless tobacco: Never Used  Substance and Sexual Activity  . Alcohol use: No    Alcohol/week: 0.0 oz  . Drug use: No  . Sexual activity: None  Other Topics Concern  .  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      Spiritual Beliefs:       Lifestyle: MWF does kickboxing, vegetarian - trying to go vegan      Caffeine use: Drinks tea rarely                  Current Outpatient Medications:  .  albuterol (PROVENTIL HFA;VENTOLIN HFA) 108 (90 Base) MCG/ACT inhaler, Inhale 1-2 puffs into the lungs every 6 (six) hours as needed for wheezing or shortness of breath., Disp: 1 Inhaler, Rfl: 0 .  Blood Glucose Monitoring Suppl (ONE TOUCH ULTRA MINI) w/Device KIT, Use to check sugar daily, Disp: 1 each, Rfl: 0 .  busPIRone (BUSPAR) 15 MG tablet, Take 1/2 tablet by mouth 2 times daily., Disp: 90 tablet, Rfl: 1 .  cyclobenzaprine (FLEXERIL) 10 MG tablet, Take 1 tablet (10 mg total) by mouth at bedtime as needed for muscle spasms.,  Disp: 20 tablet, Rfl: 0 .  glucose blood (FREESTYLE LITE) test strip, Use as instructed to check 2 times daily, Disp: 200 each, Rfl: 5 .  lamoTRIgine (LAMICTAL) 100 MG tablet, Take 1 tablet (100 mg total) by mouth daily., Disp: 90 tablet, Rfl: 1 .  Lancets (FREESTYLE) lancets, Use as instructed to check sugar 2 times daily, Disp: 200 each, Rfl: 5 .  sertraline (ZOLOFT) 100 MG tablet, Take 1 tablet (100 mg total) by mouth daily., Disp: 90 tablet, Rfl: 1 .  fluticasone (FLOVENT HFA) 44 MCG/ACT inhaler, Inhale 2 puffs into the lungs 2 (two) times daily for 14 days., Disp: 1 Inhaler, Rfl: 0 .  metFORMIN (GLUCOPHAGE) 1000 MG tablet, Take 1 tablet (1,000 mg total) by mouth 2 (two) times daily with a meal., Disp: 180 tablet, Rfl: 3  EXAM:  Vitals:   11/16/17 1013  BP: 120/80  Pulse: 69  Temp: 98.4 F (36.9 C)  SpO2: 99%    Body mass index is 33.07 kg/m.  GENERAL: vitals reviewed and listed above, alert, oriented, appears well hydrated and in no acute distress  HEENT: atraumatic, conjunttiva clear, no obvious abnormalities on inspection of external nose and ears, normal appearance of ear canals and TMs, clear nasal congestion, mild post oropharyngeal erythema with PND, no tonsillar edema or exudate, no sinus TTP  NECK: no obvious masses on inspection  LUNGS: clear to auscultation bilaterally, no wheezes, rales or rhonchi, good air movement  CV: HRRR, no peripheral edema  MS: moves all extremities without noticeable abnormality  PSYCH: pleasant and cooperative, no obvious depression or anxiety  ASSESSMENT AND PLAN:  Discussed the following assessment and plan:  Mild intermittent asthma with acute exacerbation  Viral upper respiratory illness  -Likely viral upper respiratory illness with mild flare in her mild intermittent asthma -Discussed various treatment options and she prefers to try an inhaled steroid rather than oral steroid -Rx sent, refilled albuterol -Patient advised to  return or notify a doctor immediately if symptoms worsen or persist or new concerns arise.  Patient Instructions  BEFORE YOU LEAVE: -follow up: As needed  Start the Flovent and use 2 puffs twice daily for 2 weeks.  Use the albuterol as needed.  I hope you are feeling better soon! Seek care promptly if your symptoms worsen, new concerns arise or you are not improving with treatment.      Colin Benton R., DO

## 2017-11-16 ENCOUNTER — Ambulatory Visit (INDEPENDENT_AMBULATORY_CARE_PROVIDER_SITE_OTHER): Payer: 59 | Admitting: Family Medicine

## 2017-11-16 ENCOUNTER — Encounter: Payer: Self-pay | Admitting: Family Medicine

## 2017-11-16 VITALS — BP 120/80 | HR 69 | Temp 98.4°F | Ht 64.5 in | Wt 195.7 lb

## 2017-11-16 DIAGNOSIS — J4521 Mild intermittent asthma with (acute) exacerbation: Secondary | ICD-10-CM

## 2017-11-16 DIAGNOSIS — J069 Acute upper respiratory infection, unspecified: Secondary | ICD-10-CM

## 2017-11-16 MED ORDER — ALBUTEROL SULFATE HFA 108 (90 BASE) MCG/ACT IN AERS: 1.0000 | INHALATION_SPRAY | Freq: Four times a day (QID) | RESPIRATORY_TRACT | 0 refills | Status: AC | PRN

## 2017-11-16 MED ORDER — FLUTICASONE PROPIONATE HFA 44 MCG/ACT IN AERO: 2.0000 | INHALATION_SPRAY | Freq: Two times a day (BID) | RESPIRATORY_TRACT | 0 refills | Status: AC

## 2017-11-16 NOTE — Patient Instructions (Addendum)
BEFORE YOU LEAVE: -follow up: As needed  Start the Flovent and use 2 puffs twice daily for 2 weeks.  Use the albuterol as needed.  I hope you are feeling better soon! Seek care promptly if your symptoms worsen, new concerns arise or you are not improving with treatment.

## 2017-11-20 ENCOUNTER — Ambulatory Visit: Admitting: Family Medicine

## 2017-12-28 ENCOUNTER — Encounter: Payer: Self-pay | Admitting: Family Medicine

## 2018-01-02 ENCOUNTER — Emergency Department (HOSPITAL_COMMUNITY): Payer: 59

## 2018-01-02 ENCOUNTER — Encounter (HOSPITAL_COMMUNITY): Payer: Self-pay

## 2018-01-02 ENCOUNTER — Emergency Department (HOSPITAL_COMMUNITY)
Admission: EM | Admit: 2018-01-02 | Discharge: 2018-01-02 | Disposition: A | Discharge status: Home / Self Care | Payer: 59 | Attending: Emergency Medicine | Admitting: Emergency Medicine

## 2018-01-02 ENCOUNTER — Other Ambulatory Visit: Payer: Self-pay

## 2018-01-02 DIAGNOSIS — I252 Old myocardial infarction: Secondary | ICD-10-CM | POA: Diagnosis not present

## 2018-01-02 DIAGNOSIS — E119 Type 2 diabetes mellitus without complications: Secondary | ICD-10-CM | POA: Diagnosis not present

## 2018-01-02 DIAGNOSIS — J45909 Unspecified asthma, uncomplicated: Secondary | ICD-10-CM | POA: Diagnosis not present

## 2018-01-02 DIAGNOSIS — R42 Dizziness and giddiness: Secondary | ICD-10-CM | POA: Diagnosis not present

## 2018-01-02 DIAGNOSIS — Z7984 Long term (current) use of oral hypoglycemic drugs: Secondary | ICD-10-CM | POA: Diagnosis not present

## 2018-01-02 DIAGNOSIS — R112 Nausea with vomiting, unspecified: Secondary | ICD-10-CM

## 2018-01-02 DIAGNOSIS — R197 Diarrhea, unspecified: Secondary | ICD-10-CM | POA: Insufficient documentation

## 2018-01-02 DIAGNOSIS — R1030 Lower abdominal pain, unspecified: Secondary | ICD-10-CM | POA: Insufficient documentation

## 2018-01-02 DIAGNOSIS — I2510 Atherosclerotic heart disease of native coronary artery without angina pectoris: Secondary | ICD-10-CM | POA: Insufficient documentation

## 2018-01-02 LAB — CBC
HCT: 44.1 % (ref 36.0–46.0)
Hemoglobin: 14.5 g/dL (ref 12.0–15.0)
MCH: 28 pg (ref 26.0–34.0)
MCHC: 32.9 g/dL (ref 30.0–36.0)
MCV: 85.3 fL (ref 78.0–100.0)
Platelets: 229 10*3/uL (ref 150–400)
RBC: 5.17 MIL/uL — ABNORMAL HIGH (ref 3.87–5.11)
RDW: 13 % (ref 11.5–15.5)
WBC: 14.1 10*3/uL — ABNORMAL HIGH (ref 4.0–10.5)

## 2018-01-02 LAB — COMPREHENSIVE METABOLIC PANEL
ALT: 23 U/L (ref 14–54)
AST: 20 U/L (ref 15–41)
Albumin: 4.5 g/dL (ref 3.5–5.0)
Alkaline Phosphatase: 105 U/L (ref 38–126)
Anion gap: 7 (ref 5–15)
BUN: 16 mg/dL (ref 6–20)
CO2: 26 mmol/L (ref 22–32)
Calcium: 9.3 mg/dL (ref 8.9–10.3)
Chloride: 104 mmol/L (ref 101–111)
Creatinine, Ser: 0.55 mg/dL (ref 0.44–1.00)
GFR calc Af Amer: >60 mL/min (ref >60)
GFR calc non Af Amer: >60 mL/min (ref >60)
Glucose, Bld: 182 mg/dL — ABNORMAL HIGH (ref 65–99)
Potassium: 4.2 mmol/L (ref 3.5–5.1)
Sodium: 137 mmol/L (ref 135–145)
Total Bilirubin: 1 mg/dL (ref 0.3–1.2)
Total Protein: 8.1 g/dL (ref 6.5–8.1)

## 2018-01-02 LAB — URINALYSIS, ROUTINE W REFLEX MICROSCOPIC
Bilirubin Urine: NEGATIVE
Glucose, UA: 50 mg/dL — AB
Ketones, ur: NEGATIVE mg/dL
Leukocytes, UA: NEGATIVE
Nitrite: NEGATIVE
Protein, ur: 30 mg/dL — AB
RBC / HPF: NONE SEEN RBC/hpf (ref 0–5)
Specific Gravity, Urine: 1.023 (ref 1.005–1.030)
Squamous Epithelial / LPF: NONE SEEN
pH: 5 (ref 5.0–8.0)

## 2018-01-02 LAB — LIPASE, BLOOD: Lipase: 30 U/L (ref 11–51)

## 2018-01-02 LAB — I-STAT BETA HCG BLOOD, ED (MC, WL, AP ONLY): I-stat hCG, quantitative: <5 m[IU]/mL (ref <5)

## 2018-01-02 LAB — CBG MONITORING, ED: Glucose-Capillary: 114 mg/dL — ABNORMAL HIGH (ref 65–99)

## 2018-01-02 MED ORDER — MORPHINE SULFATE (PF) 4 MG/ML IV SOLN
4.0000 mg | Freq: Once | INTRAVENOUS | Status: AC
  Administered 2018-01-02: 4 mg via INTRAVENOUS
  Filled 2018-01-02: qty 1

## 2018-01-02 MED ORDER — ONDANSETRON 4 MG PO TBDP: 4.0000 mg | ORAL_TABLET | Freq: Three times a day (TID) | ORAL | 0 refills | Status: AC | PRN

## 2018-01-02 MED ORDER — ONDANSETRON HCL 4 MG/2ML IJ SOLN
4.0000 mg | Freq: Once | INTRAMUSCULAR | Status: AC
  Administered 2018-01-02: 4 mg via INTRAVENOUS
  Filled 2018-01-02: qty 2

## 2018-01-02 MED ORDER — IOPAMIDOL (ISOVUE-300) INJECTION 61%
INTRAVENOUS | Status: AC
  Administered 2018-01-02: 100 mL
  Filled 2018-01-02: qty 100

## 2018-01-02 NOTE — ED Provider Notes (Signed)
[facility] DEPT Provider Note   CSN: [id number] Arrival date & time: 01/02/18  1006     History   Chief Complaint Chief Complaint  Patient presents with  . Abdominal Pain  . Emesis  . Diarrhea    HPI Kerri Osborn is a 57 y.o. 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   Patient had sudden urge to have a bowel movement, had a "ton" diarrhea in bathroom at work. As she was standing up from the toilet she felt lightheaded and had to hold herself at the edge of the counter to avoid falling. She lowered herself down to the ground.  There was no actual fall or head trauma.  Episode of lightheadedness was accompanied by diaphoresis and nausea, but no CP SOB palpitations. Lightheadedness has resolved. EMS checked her CBG and it was 350 at that time. Currently reports mild nausea and dull, diffuse lower abdominal pain. Aggravating factors include palpation of lower abdomen. States nausea medicine and IV fluids given to her by EMS have helped. Has been ambulatory in the waiting room without any more lightheadedness. Reports she has been dieting/fasting for the last 48 hours, last night she went to [facility] and had a very large dinner. States she ate it very fast. Onset of symptoms today was sudden. No history of diverticulitis, IBD, GI bleed.  HPI  Past Medical History:  Diagnosis Date  . Allergy   . Arthritis   . Arthritis, rheumatoid ([place])   . Asthma   . Bipolar disorder ([place])   . Chicken pox   . Coronary artery disease   . Depression   . Diabetes mellitus   . Diverticulosis   . Dyspnea 04/13/2011   pfts 03/2011:  Normal FEV1 and FEV1/FVC Ct chest 2012:  No PE, normal parenchyma [name] 04/18/2011 with active symptoms:  Normal  FEV1%, mild decrease in FVC due to restriction vs airtrapping, normal appearing       flow volume loop.    . Fatty liver   . Migraines   . Myocardial infarction ([place])   . Polyarthralgia - managed by Dr. Ouida Sills per her report 02/05/2013  . 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 Seasonal allergies     Patient Active Problem List   Diagnosis Date Noted  . Asthma 11/15/2017  . Type 2 diabetes mellitus with hyperglycemia, with long-term current use of insulin ([place]) 05/02/2016  . Hyperlipidemia 04/14/2016  . Obesity 04/14/2016  . Bipolar affective disorder ([place]) 02/05/2013  . Polyarthralgia - managed by Dr. Ouida Sills per her report 02/05/2013    Past Surgical History:  Procedure Laterality Date  . ABDOMINAL HYSTERECTOMY    . APPENDECTOMY    . BREAST CYST EXCISION    . CESAREAN SECTION    . INNER EAR SURGERY     tubes in ears  . MYOMECTOMY    . TUBAL LIGATION      OB History    No data available       Home Medications    Prior to Admission medications   Medication Sig Start Date End Date Taking? Authorizing Provider  albuterol (PROVENTIL HFA;VENTOLIN HFA) 108 (90 Base) MCG/ACT inhaler Inhale 1-2 puffs into the lungs every 6 (six) hours as needed for wheezing or shortness of breath. 11/16/17   Lucretia Kern,  DO  Blood Glucose Monitoring Suppl (ONE TOUCH ULTRA MINI) w/Device KIT Use to check sugar daily 04/27/17   Philemon Kingdom, MD  busPIRone (BUSPAR) 15 MG tablet Take 1/2 tablet by mouth 2 times daily. 11/17/14   Philemon Kingdom, MD  cyclobenzaprine (FLEXERIL) 10 MG tablet Take 1 tablet (10 mg total) by mouth at bedtime as needed for muscle spasms. 09/08/17   Lucretia Kern, DO  fluticasone (FLOVENT HFA) 44 MCG/ACT inhaler Inhale 2 puffs into the lungs 2 (two) times daily for 14 days. 11/16/17 11/30/17  Colin Benton R, DO  glucose blood (FREESTYLE LITE) test strip Use as instructed to check 2 times daily 04/28/17   Philemon Kingdom, MD  lamoTRIgine (LAMICTAL) 100 MG  tablet Take 1 tablet (100 mg total) by mouth daily. 11/17/14   Philemon Kingdom, MD  Lancets (FREESTYLE) lancets Use as instructed to check sugar 2 times daily 04/28/17   Philemon Kingdom, MD  metFORMIN (GLUCOPHAGE) 1000 MG tablet Take 1 tablet (1,000 mg total) by mouth 2 (two) times daily with a meal. 08/11/17 09/10/17  Philemon Kingdom, MD  ondansetron (ZOFRAN ODT) 4 MG disintegrating tablet Take 1 tablet (4 mg total) by mouth every 8 (eight) hours as needed for nausea or vomiting. 01/02/18   Kinnie Feil, PA-C  sertraline (ZOLOFT) 100 MG tablet Take 1 tablet (100 mg total) by mouth daily. 11/17/14   Philemon Kingdom, MD    Family History Family History  Problem Relation Age of Onset  . Sudden death Mother   . Kidney failure Mother   . Sudden death Father   . Allergies Sister   . Allergies Daughter   . Allergies Daughter   . Asthma Daughter   . Asthma Daughter   . Arthritis Maternal Grandmother   . Hyperlipidemia Maternal Grandmother   . Diabetes Maternal Grandmother   . Hypertension Maternal Aunt   . Mental illness Unknown   . Colon cancer Neg Hx   . Rectal cancer Neg Hx   . Stomach cancer Neg Hx   . Neuropathy Neg Hx     Social History Social History   Tobacco Use  . Smoking status: Never Smoker  . Smokeless tobacco: Never Used  Substance Use Topics  . Alcohol use: No    Alcohol/week: 0.0 oz  . Drug use: No     Allergies   Amoxicillin   Review of Systems Review of Systems  Constitutional: Positive for diaphoresis (resolved).  Gastrointestinal: Positive for abdominal pain, diarrhea, nausea and vomiting.  Neurological: Positive for light-headedness (resolved).  All other systems reviewed and are negative.    Physical Exam Updated Vital Signs BP (!) 145/101 (BP Location: Right Arm)   Pulse 77   Temp (!) 97.5 F (36.4 C) (Oral)   Resp 16   Ht '5\' 3"'  (1.6 m)   Wt 84.8 kg (187 lb)   SpO2 100%   BMI 33.13 kg/m   Physical Exam  Constitutional: She  is oriented to person, place, and time. She appears well-developed and well-nourished. No distress.  NAD. Nontoxic.  HENT:  Head: Normocephalic and atraumatic.  Right Ear: External ear normal.  Left Ear: External ear normal.  Nose: Nose normal.  Moist mucous membranes. Atraumatic.  Eyes: Conjunctivae and EOM are normal. No scleral icterus.  Neck: Normal range of motion. Neck supple.  Cardiovascular: Normal rate, regular rhythm and normal heart sounds.  No murmur heard. 2+ DP and radial pulses bilaterally. No lower extremity edema. No pulsating abdominal masses.  Pulmonary/Chest: Effort  normal and breath sounds normal. She has no wheezes.  Abdominal: Soft. Normal appearance and bowel sounds are normal. There is tenderness in the right lower quadrant, periumbilical area, suprapubic area and left lower quadrant.  Soft. Diffuse, nonfocal, lower abdominal pain. No guarding, rigidity, rebound. No CVA tenderness.  Musculoskeletal: Normal range of motion. She exhibits no deformity.  Neurological: She is alert and oriented to person, place, and time.  Skin: Skin is warm and dry. Capillary refill takes less than 2 seconds.  Psychiatric: She has a normal mood and affect. Her behavior is normal. Judgment and thought content normal.  Nursing note and vitals reviewed.    ED Treatments / Results  Labs (all labs ordered are listed, but only abnormal results are displayed) Labs Reviewed  COMPREHENSIVE METABOLIC PANEL - Abnormal; Notable for the following components:      Result Value   Glucose, Bld 182 (*)    All other components within normal limits  CBC - Abnormal; Notable for the following components:   WBC 14.1 (*)    RBC 5.17 (*)    All other components within normal limits  URINALYSIS, ROUTINE W REFLEX MICROSCOPIC - Abnormal; Notable for the following components:   APPearance HAZY (*)    Glucose, UA 50 (*)    Hgb urine dipstick SMALL (*)    Protein, ur 30 (*)    Bacteria, UA RARE (*)      All other components within normal limits  CBG MONITORING, ED - Abnormal; Notable for the following components:   Glucose-Capillary 114 (*)    All other components within normal limits  URINE CULTURE  LIPASE, BLOOD  I-STAT BETA HCG BLOOD, ED ([facility], [facility], [facility] ONLY)  CBG MONITORING, ED    EKG  EKG Interpretation None       Radiology Ct Abdomen Pelvis W Contrast  Result Date: 01/02/2018 CLINICAL DATA:  Diarrhea and abdominal pain. EXAM: CT ABDOMEN AND PELVIS WITH CONTRAST TECHNIQUE: Multidetector CT imaging of the abdomen and pelvis was performed using the standard protocol following bolus administration of intravenous contrast. CONTRAST:  150m ISOVUE-300 IOPAMIDOL (ISOVUE-300) INJECTION 61% COMPARISON:  CT abdomen pelvis dated October 01, 2015. FINDINGS: Lower chest: No acute abnormality. Hepatobiliary: Hepatic steatosis. No focal liver abnormality is seen. No gallstones, gallbladder wall thickening, or biliary dilatation. Pancreas: Prominence of the main pancreatic duct is unchanged. No surrounding inflammatory changes. Spleen: Normal in size without focal abnormality. Adrenals/Urinary Tract: Adrenal glands are unremarkable. Kidneys are normal, without renal calculi, focal lesion, or hydronephrosis. Bladder is unremarkable. Stomach/Bowel: Stomach is within normal limits. Appendix is not visualized. No evidence of bowel wall thickening, distention, or inflammatory changes. Mild left-sided colonic diverticulosis. Vascular/Lymphatic: No significant vascular findings are present. No enlarged abdominal or pelvic lymph nodes. Reproductive: Status post hysterectomy. No adnexal masses. Other: No free fluid or pneumoperitoneum. Musculoskeletal: No acute or significant osseous findings. Stable degenerative changes of the pubic symphysis. IMPRESSION: 1.  No acute intra-abdominal process. 2. Colonic diverticulosis without evidence of diverticulitis. 3. Unchanged hepatic steatosis. 4. Mild prominence of the  main pancreatic duct is unchanged since October 2016. Electronically Signed   By: WTitus DubinM.D.   On: 01/02/2018 16:34    Procedures Procedures (including critical care time)  Medications Ordered in ED Medications  ondansetron (ZOFRAN) injection 4 mg (4 mg Intravenous Given 01/02/18 1536)  morphine 4 MG/ML injection 4 mg (4 mg Intravenous Given 01/02/18 1538)  iopamidol (ISOVUE-300) 61 % injection (100 mLs  Contrast Given 01/02/18 1615)  Initial Impression / Assessment and Plan / ED Course  I have reviewed the triage vital signs and the nursing notes.  Pertinent labs & imaging results that were available during my care of the patient were reviewed by me and considered in my medical decision making (see chart for details).  Clinical Course as of Jan 03 1704  Tue Jan 02, 2018  1436 WBC: (!) 14.1 [CG]  1436 Appearance: (!) HAZY [CG]  1436 Hgb urine dipstick: (!) SMALL [CG]  1552 Called CT regarding delay. Pt is second on list currently only on CT tech causing delay   [CG]    Clinical Course User Index [CG] Kinnie Feil, PA-C    57 year old female presents for sudden onset nausea, vomiting, diarrhea and diffuse lower abdominal pain. Brief episode of lightheadedness after having a large bowel movement has since resolved. History of diverticulosis, hysterectomy, appendectomy, bilateral tubal ligation.  On exam, she is nontoxic. Abdomen is soft. She has diffuse lower abdominal and periumbilical tenderness, most tender at left lower quadrant. Reports continued nausea. She has no urinary symptoms or vaginal symptoms or anatomy to warrant pelvic exam.  Lab work significant for mild leukocytosis. Small hemoglobin and urine. We'll get CT abdomen and pelvis. Morphine and Zofran redosed.   Final Clinical Impressions(s) / ED Diagnoses   1700: CT abdomen and pelvis negative for acute process. Diverticulosis without infection. Repeat abdominal exam significantly improved. Patient is  hungry and wants to eat. We'll send urine for culture to r/o UTI, she has no symptoms. Plan to by mouth challenge and discharged home with Zofran. Discussed return precautions. Patient verbalized understanding and agreeable with plan. Final diagnoses:  Nausea vomiting and diarrhea    ED Discharge Orders        Ordered    ondansetron (ZOFRAN ODT) 4 MG disintegrating tablet  Every 8 hours PRN     01/02/18 1655       Kinnie Feil, [place] 01/02/18 1705    Virgel Manifold, MD 01/03/18 [phone]

## 2018-01-02 NOTE — Discharge Instructions (Addendum)
Your labs and CT scan were normal. You have diverticulosis without infection. I suspect you may have had a 24-36 hour stomach bug.   There was a small amount of blood in your urine. However you had to urinary tract symptoms. We have sent your urine for culture to confirm there is no infection. You will be called in 2 days if urine is infected so you know to seek treatment.   Use zofran for nausea. Stick to mild, low fat diet. Small meals every 3-4 hours are better than one large meal. Stay well hydrated.   Follow up with your primary care provider if your nausea, vomiting, diarrhea persist after 2 days. Return to the emergency department if your abdominal pain worsens, you develop fever, bloody vomit or bloody bowel movements.

## 2018-01-02 NOTE — ED Notes (Signed)
Provided patient a ginger ale with permission from provider.

## 2018-01-02 NOTE — ED Triage Notes (Signed)
Per EMS- Patient  c/o sudden onset of abdominal pain, dizziness, N/V/D while at work. Patient was given Zofran 4 mg IV prior to arival to the ED. Patient states she took a laxative yesterday.

## 2018-01-02 NOTE — ED Notes (Signed)
Pt had drawn for labs:  Gold Red Lavender Blue Lt green Dark green x2

## 2018-01-02 NOTE — ED Provider Notes (Signed)
Patient placed in Quick Look pathway, seen and evaluated   Chief Complaint: abdominal pain, emesis, diarrhea, generalized weakness  HPI:   Since this morning, had abdominal pain, had BM and one episode of NBNB emesis with improvement in her abdominal pain. But then felt like she was going to pass out. Denies head injury or LOC. Now just feels like "dull, gas pain but not that painful." Is following a new keto/vegan diet so has been fasting for most of the day. Yesterday "wolfed down" her meal from [employer] for dinner instead of gradually eating like she usually does.  ROS: emesis, diarrhea  Physical Exam:   Gen: No distress  Neuro: Awake and Alert  Skin: Warm    Focused Exam: no abdominal TTP but reports "gas pain" in RMQ. Overall well appearing.   Initiation of care has begun. The patient has been counseled on the process, plan, and necessity for staying for the completion/evaluation, and the remainder of the medical screening examination   Delia Heady, PA-C 01/02/18 1257    Virgel Manifold, MD 01/02/18 [phone]

## 2018-01-04 LAB — URINE CULTURE: Culture: <10000 — AB

## 2018-01-23 ENCOUNTER — Encounter: Payer: Self-pay | Admitting: Internal Medicine

## 2018-01-23 ENCOUNTER — Ambulatory Visit (INDEPENDENT_AMBULATORY_CARE_PROVIDER_SITE_OTHER): Payer: 59 | Admitting: Internal Medicine

## 2018-01-23 ENCOUNTER — Ambulatory Visit: Admitting: Internal Medicine

## 2018-01-23 VITALS — BP 140/80 | HR 81 | Wt 191.0 lb

## 2018-01-23 DIAGNOSIS — E785 Hyperlipidemia, unspecified: Secondary | ICD-10-CM | POA: Diagnosis not present

## 2018-01-23 DIAGNOSIS — Z794 Long term (current) use of insulin: Secondary | ICD-10-CM | POA: Diagnosis not present

## 2018-01-23 DIAGNOSIS — E1165 Type 2 diabetes mellitus with hyperglycemia: Secondary | ICD-10-CM

## 2018-01-23 LAB — POCT GLYCOSYLATED HEMOGLOBIN (HGB A1C): Hemoglobin A1C: 8.3

## 2018-01-23 MED ORDER — METFORMIN HCL 1000 MG PO TABS: 1000.0000 mg | ORAL_TABLET | Freq: Two times a day (BID) | ORAL | 3 refills | Status: DC

## 2018-01-23 NOTE — Patient Instructions (Addendum)
Please increase metformin to 1000 mg 2x a day with meals.  Please come back for a follow-up appointment in 3 months.

## 2018-01-23 NOTE — Addendum Note (Signed)
Addended by: Nile Riggs on: 01/23/2018 10:40 AM   Modules accepted: Orders

## 2018-01-23 NOTE — Progress Notes (Signed)
Subjective:     Patient ID: Kerri Osborn, female   DOB: 05-Jun-1961, 57 y.o.   MRN: [id number]  HPI Ms. Behanna is a 57 y.o. woman, returning for f/u for DM2, dx. 4259, without complications, uncontrolled, prev. insulin-dependent, now off insulin since 2012. Last visit 6 mo ago.  She had a URI in 10/2017 >> had a Prednisone taper.  She is back on the vegan diet >> no arthritis anymore.  Her last A1c was: Lab Results  Component Value Date   HGBA1C 7.9 08/11/2017   HGBA1C 10.9 05/04/2017   HGBA1C 8.2 (H) 08/01/2016   Previous treatment regimen: - VGo 20 + 3 clicks per meal - since last year >> bruising and numbness >> would like switch to injections - JanuMet 50-1000 mg daily bid She wanted to come off the VGo as it caused skin changes. - Lantus 24 >> 26 >> 20 units at bedtime  - NovoLog 6 >> 10 >> 5 units 15 min before every meal - 3x a day - Sliding scale of NovoLog - does not need to use it lately: - 150-175: + 1 unit  - 176-200: + 2 units  - 201-225: + 3 units  - >225: + 4 units  She was out of Janumet 50-1000 mg 2x a day - for 2 months 2/2 insurance coverage.  She stopped all insulin for >1 year. Restarted Metformin before last visit.  Now on: - Metformin 500 mg 2x a day - started 04/24/2017 >> increased to 1000 mg twice a day September 03, 2017, but she is only taking 1000 mg at dinnertime... We tried to start Trulicity and Bydureon - not covered.  She checks sugars 1-2x a day (no log, meter): - am: end of her day (works nights): 133-267 >> 223-229 >> 140s >> 93, 130-140, 170 (sick) - 2h after b'fast: n/c >> highest 165 (pancakes) >> n/c - before lunch: 140-160 >> n/c - 2h after lunch: n/c >> 108-120 >> n/c - before dinner: 100-103 >> 200s >> (waking up)  65-80s >> n/c - bedtime: 140-150s >> n/c >> 140-170s >> 105-160 She has hypoglycemia awareness in the 60s. Lowest: 93. Highest: 172  Walks 2 mi a day.  - Last eye appt: 02/2015: No DR  -No CKD: Lab Results  Component  Value Date   BUN 16 01/02/2018   BUN 18 06/06/2017   Lab Results  Component Value Date   CREATININE 0.55 01/02/2018   CREATININE 0.60 06/06/2017  Not on ACE inhibitor or ARB  Last ACR: Lab Results  Component Value Date   MICRALBCREAT 11.1 08/01/2016   MICRALBCREAT 23.1 06/25/2015   MICRALBCREAT 7.9 04/15/2014   MICRALBCREAT 8.1 02/05/2013   -+ HL: Lab Results  Component Value Date   CHOL 258 (H) 05/19/2017   HDL 51.60 05/19/2017   LDLCALC 184 (H) 05/19/2017   LDLDIRECT 146.2 02/05/2013   TRIG 113.0 05/19/2017   CHOLHDL 5 05/19/2017  She was not compliant with Lipitor in the past She denies numbness tingling in her feet.  Also has RA - Dr. Ouida Sills (rheum.).  Husband died in 2015/09/04 (hemorrhagic stroke). Works at [employer].  Review of Systems Constitutional: no weight gain/no weight loss, no fatigue, no subjective hyperthermia, no subjective hypothermia Eyes: no blurry vision, no xerophthalmia ENT: no sore throat, no nodules palpated in throat, no dysphagia, no odynophagia, no hoarseness Cardiovascular: no CP/no SOB/no palpitations/no leg swelling Respiratory: + cough/no SOB/no wheezing Gastrointestinal:+N/no V/+ D/no C/no acid reflux Musculoskeletal: no muscle aches/no joint aches Skin:  no rashes, no hair loss Neurological: no tremors/no numbness/no tingling/no dizziness  I reviewed pt's medications, allergies, PMH, social hx, family hx, and changes were documented in the history of present illness. Otherwise, unchanged from my initial visit note.   Objective:   Physical Exam BP 140/80   Pulse 81   Wt 191 lb (86.6 kg)   SpO2 98%   BMI 33.83 kg/m  Body mass index is 33.83 kg/m. Wt Readings from Last 3 Encounters:  01/23/18 191 lb (86.6 kg)  01/02/18 187 lb (84.8 kg)  11/16/17 195 lb 11.2 oz (88.8 kg)   Constitutional: overweight, in NAD Eyes: PERRLA, EOMI, no exophthalmos ENT: moist mucous membranes, no thyromegaly, no cervical  lymphadenopathy Cardiovascular: RRR, No MRG Respiratory: CTA B Gastrointestinal: abdomen soft, NT, ND, BS+ Musculoskeletal: no deformities, strength intact in all 4 Skin: moist, warm, no rashes Neurological: no tremor with outstretched hands, DTR normal in all 4   Assessment:     1. DM2, without long complications, uncontrolled, insulin-independent, with hyperglycemia  2. HL    Plan:     1. DM2  - patient with long-standing type 2 diabetes, with significant improvement in her sugars after she restarted the vegan diet and started to exercise.  At last visit I advised her to increase her dose of metformin as sugars were not quite at goal. She did not remember this >> only on 1000 mg Metformin at dinnertime.  - at this visit, sugars are still above goal >> will increase Metformin to 2x a day - I advised her to:  Patient Instructions  Please increase metformin 1000 mg 2x a day with meals.  Please come back for a follow-up appointment in 3 months.  - today, HbA1c is 8.3% (higher) - continue checking sugars at different times of the day - check 1x a day, rotating checks - advised for yearly eye exams >> she is UTD - Return to clinic in 3 mo with sugar log   2. HL -Reviewed latest lipid panel: LDL very high -Continues on Lipitor without side effects -Continue vegan diet, this should greatly help  Philemon Kingdom, MD PhD [facility]

## 2018-04-24 ENCOUNTER — Ambulatory Visit: Payer: 59 | Admitting: Internal Medicine

## 2018-05-14 ENCOUNTER — Other Ambulatory Visit: Payer: Self-pay

## 2018-05-14 ENCOUNTER — Emergency Department (HOSPITAL_COMMUNITY)
Admission: EM | Admit: 2018-05-14 | Discharge: 2018-05-14 | Disposition: A | Discharge status: Home / Self Care | Payer: 59 | Attending: Emergency Medicine | Admitting: Emergency Medicine

## 2018-05-14 ENCOUNTER — Encounter (HOSPITAL_COMMUNITY): Payer: Self-pay | Admitting: Emergency Medicine

## 2018-05-14 DIAGNOSIS — M069 Rheumatoid arthritis, unspecified: Secondary | ICD-10-CM | POA: Insufficient documentation

## 2018-05-14 DIAGNOSIS — Y929 Unspecified place or not applicable: Secondary | ICD-10-CM | POA: Insufficient documentation

## 2018-05-14 DIAGNOSIS — J45909 Unspecified asthma, uncomplicated: Secondary | ICD-10-CM | POA: Insufficient documentation

## 2018-05-14 DIAGNOSIS — Y9389 Activity, other specified: Secondary | ICD-10-CM | POA: Insufficient documentation

## 2018-05-14 DIAGNOSIS — I2510 Atherosclerotic heart disease of native coronary artery without angina pectoris: Secondary | ICD-10-CM | POA: Insufficient documentation

## 2018-05-14 DIAGNOSIS — E119 Type 2 diabetes mellitus without complications: Secondary | ICD-10-CM | POA: Insufficient documentation

## 2018-05-14 DIAGNOSIS — Z7984 Long term (current) use of oral hypoglycemic drugs: Secondary | ICD-10-CM | POA: Diagnosis not present

## 2018-05-14 DIAGNOSIS — X58XXXA Exposure to other specified factors, initial encounter: Secondary | ICD-10-CM | POA: Diagnosis not present

## 2018-05-14 DIAGNOSIS — S39012A Strain of muscle, fascia and tendon of lower back, initial encounter: Secondary | ICD-10-CM | POA: Diagnosis not present

## 2018-05-14 DIAGNOSIS — Y999 Unspecified external cause status: Secondary | ICD-10-CM | POA: Diagnosis not present

## 2018-05-14 DIAGNOSIS — Z79899 Other long term (current) drug therapy: Secondary | ICD-10-CM | POA: Insufficient documentation

## 2018-05-14 DIAGNOSIS — S3992XA Unspecified injury of lower back, initial encounter: Secondary | ICD-10-CM | POA: Diagnosis present

## 2018-05-14 DIAGNOSIS — I252 Old myocardial infarction: Secondary | ICD-10-CM | POA: Diagnosis not present

## 2018-05-14 MED ORDER — CYCLOBENZAPRINE HCL 10 MG PO TABS: 10.0000 mg | ORAL_TABLET | Freq: Every day | ORAL | 0 refills | Status: AC

## 2018-05-14 MED ORDER — KETOROLAC TROMETHAMINE 60 MG/2ML IM SOLN
30.0000 mg | Freq: Once | INTRAMUSCULAR | Status: AC
  Administered 2018-05-14: 30 mg via INTRAMUSCULAR
  Filled 2018-05-14: qty 2

## 2018-05-14 MED ORDER — NAPROXEN SODIUM 220 MG PO TABS: 220.0000 mg | ORAL_TABLET | Freq: Two times a day (BID) | ORAL | Status: AC | PRN

## 2018-05-14 NOTE — Discharge Instructions (Signed)
You may use over-the-counter Motrin (Ibuprofen), Acetaminophen (Tylenol), topical muscle creams such as SalonPas, Icy Hot, Bengay, etc. Please stretch, apply heat, and have massage therapy for additional assistance. ° °

## 2018-05-14 NOTE — ED Provider Notes (Signed)
[facility] DEPT Provider Note  CSN: [id number] Arrival date & time: 05/14/18 7253  Chief Complaint(s) Back Pain  HPI Kerri Osborn is a 57 y.o. female with a past medical history listed below including recurrent lower back pain/sciatica who presents to the emergency department with exacerbation of her lower back pain.  Patient reports that she drove 3 hours coming back into town last night.  Started noticing pain coming on.  This morning around 3 AM the pain worsen monitor and sleep.  Pain is exacerbated with movement and palpation of the left lumbosacral region.  She endorses some radiation down to the lateral aspect of the left thigh.  Alleviated by being still.  Has not tried taking any medication for pain.  Denies any falls or trauma.  Denies any bladder/bowel incontinence.  She denies any lower extremity weakness or loss of sensation.  No abdominal pain.  No nausea or vomiting.  No urinary symptoms.  HPI  Past Medical History Past Medical History:  Diagnosis Date  . Allergy   . Arthritis   . Arthritis, rheumatoid ([place])   . Asthma   . Bipolar disorder ([place])   . Chicken pox   . Coronary artery disease   . Depression   . Diabetes mellitus   . Diverticulosis   . Dyspnea 04/13/2011   pfts 03/2011:  Normal FEV1 and FEV1/FVC Ct chest 2012:  No PE, normal parenchyma [name] 04/18/2011 with active symptoms:  Normal FEV1%, mild decrease in FVC due to restriction vs airtrapping, normal appearing       flow volume loop.    . Fatty liver   . Migraines   . Myocardial infarction ([place])   . Polyarthralgia - managed by Dr. Ouida Sills per her report 02/05/2013  . 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 Seasonal allergies    Patient Active Problem List   Diagnosis Date Noted  . Asthma 11/15/2017  . Type 2 diabetes mellitus with hyperglycemia, with long-term current use of insulin ([place]) 05/02/2016  . Hyperlipidemia 04/14/2016  . Obesity 04/14/2016  . Bipolar  affective disorder ([place]) 02/05/2013  . Polyarthralgia - managed by Dr. Ouida Sills per her report 02/05/2013   Home Medication(s) Prior to Admission medications   Medication Sig Start Date End Date Taking? Authorizing Provider  albuterol (PROVENTIL HFA;VENTOLIN HFA) 108 (90 Base) MCG/ACT inhaler Inhale 1-2 puffs into the lungs every 6 (six) hours as needed for wheezing or shortness of breath. 11/16/17   Lucretia Kern, DO  Blood Glucose Monitoring Suppl (ONE TOUCH ULTRA MINI) w/Device KIT Use to check sugar daily 04/27/17   Philemon Kingdom, MD  busPIRone (BUSPAR) 15 MG tablet Take 1/2 tablet by mouth 2 times daily. 11/17/14   Philemon Kingdom, MD  cyclobenzaprine (FLEXERIL) 10 MG tablet Take 1 tablet (10 mg total) by mouth at bedtime for 10 days. 05/14/18 05/24/18  Fatima Blank, MD  fluticasone (FLOVENT HFA) 44 MCG/ACT inhaler Inhale 2 puffs into the lungs 2 (two) times daily for 14 days. 11/16/17 11/30/17  Colin Benton R, DO  glucose blood (FREESTYLE LITE) test strip Use as instructed to check 2 times daily 04/28/17   Philemon Kingdom, MD  lamoTRIgine (LAMICTAL) 100 MG tablet Take 1 tablet (100 mg total) by mouth daily. 11/17/14   Philemon Kingdom, MD  Lancets (FREESTYLE) lancets Use as instructed to check sugar 2 times daily 04/28/17   Philemon Kingdom, MD  metFORMIN (GLUCOPHAGE) 1000 MG tablet Take 1 tablet (1,000 mg total)  by mouth 2 (two) times daily with a meal. 01/23/18 01/18/19  Philemon Kingdom, MD  naproxen sodium (ALEVE) 220 MG tablet Take 1-2 tablets (220-440 mg total) by mouth 2 (two) times daily as needed for up to 10 days. 05/14/18 05/24/18  Fatima Blank, MD  ondansetron (ZOFRAN ODT) 4 MG disintegrating tablet Take 1 tablet (4 mg total) by mouth every 8 (eight) hours as needed for nausea or vomiting. Patient not taking: Reported on 01/23/2018 01/02/18   Kinnie Feil, PA-C  sertraline (ZOLOFT) 100 MG tablet Take 1 tablet (100 mg total) by mouth daily. 11/17/14   Philemon Kingdom, MD                                                                                                                                    Past Surgical History Past Surgical History:  Procedure Laterality Date  . ABDOMINAL HYSTERECTOMY    . APPENDECTOMY    . BREAST CYST EXCISION    . CESAREAN SECTION    . INNER EAR SURGERY     tubes in ears  . MYOMECTOMY    . TUBAL LIGATION     Family History Family History  Problem Relation Age of Onset  . Sudden death Mother   . Kidney failure Mother   . Sudden death Father   . Allergies Sister   . Allergies Daughter   . Allergies Daughter   . Asthma Daughter   . Asthma Daughter   . Arthritis Maternal Grandmother   . Hyperlipidemia Maternal Grandmother   . Diabetes Maternal Grandmother   . Hypertension Maternal Aunt   . Mental illness Unknown   . Colon cancer Neg Hx   . Rectal cancer Neg Hx   . Stomach cancer Neg Hx   . Neuropathy Neg Hx     Social History Social History   Tobacco Use  . Smoking status: Never Smoker  . Smokeless tobacco: Never Used  Substance Use Topics  . Alcohol use: No    Alcohol/week: 0.0 oz  . Drug use: No   Allergies Amoxicillin  Review of Systems Review of Systems All other systems are reviewed and are negative for acute change except as noted in the HPI  Physical Exam Vital Signs  I have reviewed the triage vital signs BP (!) 145/83 (BP Location: Left Arm)   Pulse 87   Temp 98.5 F (36.9 C) (Oral)   Resp 18   Ht 5' 3" (1.6 m)   Wt 84.4 kg (186 lb)   SpO2 99%   BMI 32.95 kg/m   Physical Exam  Constitutional: She is oriented to person, place, and time. She appears well-developed and well-nourished. No distress.  HENT:  Head: Normocephalic and atraumatic.  Right Ear: External ear normal.  Left Ear: External ear normal.  Nose: Nose normal.  Eyes: Conjunctivae and EOM are normal. No scleral icterus.  Neck: Normal range of motion and  phonation normal.  Cardiovascular: Normal rate  and regular rhythm.  Pulmonary/Chest: Effort normal. No stridor. No respiratory distress.  Abdominal: She exhibits no distension.  Musculoskeletal: Normal range of motion. She exhibits no edema.       Lumbar back: She exhibits tenderness and spasm. She exhibits no bony tenderness.       Back:  Neurological: She is alert and oriented to person, place, and time.  Spine Exam: Strength: 5/5 throughout LE bilaterally (hip flexion/extension, adduction/abduction; knee flexion/extension; foot dorsiflexion/plantarflexion, inversion/eversion; great toe inversion) Sensation: Intact to light touch in proximal and distal LE bilaterally Reflexes: 1+ quadriceps and achilles reflexes   Skin: She is not diaphoretic.  Psychiatric: She has a normal mood and affect. Her behavior is normal.  Vitals reviewed.   ED Results and Treatments Labs (all labs ordered are listed, but only abnormal results are displayed) Labs Reviewed - No data to display                                                                                                                       EKG  EKG Interpretation  Date/Time:    Ventricular Rate:    PR Interval:    QRS Duration:   QT Interval:    QTC Calculation:   R Axis:     Text Interpretation:        Radiology No results found. Pertinent labs & imaging results that were available during my care of the patient were reviewed by me and considered in my medical decision making (see chart for details).  Medications Ordered in ED Medications  ketorolac (TORADOL) injection 30 mg (has no administration in time range)                                                                                                                                    Procedures Procedures  (including critical care time)  Medical Decision Making / ED Course I have reviewed the nursing notes for this encounter and the patient's prior records (if available in EHR or on provided paperwork).      57 y.o. female presents with back pain in lumbar area for 1 day with signs of radicular pain. No acute traumatic onset. No red flag symptoms of fever, weight loss, saddle anesthesia, weakness, fecal/urinary incontinence or urinary retention.   Suspect MSK etiology. No indication for imaging emergently. Patient was recommended to take short course of  scheduled NSAIDs and engage in early mobility as definitive treatment. Return precautions discussed for worsening or new concerning symptoms.    Final Clinical Impression(s) / ED Diagnoses Final diagnoses:  Strain of lumbar region, initial encounter   Disposition: Discharge  Condition: Good  I have discussed the results, Dx and Tx plan with the patient who expressed understanding and agree(s) with the plan. Discharge instructions discussed at great length. The patient was given strict return precautions who verbalized understanding of the instructions. No further questions at time of discharge.    ED Discharge Orders        Ordered    cyclobenzaprine (FLEXERIL) 10 MG tablet  Daily at bedtime     05/14/18 0723    naproxen sodium (ALEVE) 220 MG tablet  2 times daily PRN     05/14/18 0723       Follow Up: Lucretia Kern, DO [place] [address] [phone]  Schedule an appointment as soon as possible for a visit  in 1-2 weeks, If symptoms do not improve or  worsen      This chart was dictated using voice recognition software.  Despite best efforts to proofread,  errors can occur which can change the documentation meaning.   Fatima Blank, MD 05/14/18 [phone]

## 2018-05-14 NOTE — ED Triage Notes (Signed)
Pt reports pain in lower back with radiation down left leg that started at 3am.

## 2018-05-17 ENCOUNTER — Other Ambulatory Visit: Payer: Self-pay | Admitting: Internal Medicine

## 2018-06-26 ENCOUNTER — Encounter: Payer: Self-pay | Admitting: Family Medicine

## 2018-08-05 ENCOUNTER — Encounter (HOSPITAL_COMMUNITY): Payer: Self-pay

## 2018-08-05 ENCOUNTER — Emergency Department (HOSPITAL_COMMUNITY): Payer: 59

## 2018-08-05 ENCOUNTER — Emergency Department (HOSPITAL_COMMUNITY)
Admission: EM | Admit: 2018-08-05 | Discharge: 2018-08-05 | Disposition: A | Discharge status: Home / Self Care | Payer: 59 | Attending: Emergency Medicine | Admitting: Emergency Medicine

## 2018-08-05 ENCOUNTER — Other Ambulatory Visit: Payer: Self-pay

## 2018-08-05 DIAGNOSIS — M79671 Pain in right foot: Secondary | ICD-10-CM | POA: Insufficient documentation

## 2018-08-05 DIAGNOSIS — Y9389 Activity, other specified: Secondary | ICD-10-CM | POA: Diagnosis not present

## 2018-08-05 DIAGNOSIS — W208XXA Other cause of strike by thrown, projected or falling object, initial encounter: Secondary | ICD-10-CM | POA: Insufficient documentation

## 2018-08-05 DIAGNOSIS — J45909 Unspecified asthma, uncomplicated: Secondary | ICD-10-CM | POA: Diagnosis not present

## 2018-08-05 DIAGNOSIS — R202 Paresthesia of skin: Secondary | ICD-10-CM | POA: Diagnosis not present

## 2018-08-05 DIAGNOSIS — E119 Type 2 diabetes mellitus without complications: Secondary | ICD-10-CM | POA: Insufficient documentation

## 2018-08-05 DIAGNOSIS — Y999 Unspecified external cause status: Secondary | ICD-10-CM | POA: Diagnosis not present

## 2018-08-05 DIAGNOSIS — S8011XA Contusion of right lower leg, initial encounter: Secondary | ICD-10-CM | POA: Diagnosis not present

## 2018-08-05 DIAGNOSIS — Y929 Unspecified place or not applicable: Secondary | ICD-10-CM | POA: Diagnosis not present

## 2018-08-05 DIAGNOSIS — S8991XA Unspecified injury of right lower leg, initial encounter: Secondary | ICD-10-CM | POA: Diagnosis present

## 2018-08-05 DIAGNOSIS — Z7984 Long term (current) use of oral hypoglycemic drugs: Secondary | ICD-10-CM | POA: Insufficient documentation

## 2018-08-05 DIAGNOSIS — I2510 Atherosclerotic heart disease of native coronary artery without angina pectoris: Secondary | ICD-10-CM | POA: Insufficient documentation

## 2018-08-05 DIAGNOSIS — Z79899 Other long term (current) drug therapy: Secondary | ICD-10-CM | POA: Insufficient documentation

## 2018-08-05 NOTE — Discharge Instructions (Signed)
Take over-the-counter medications as needed for pain, use crutches and splint for comfort to help rest the leg, follow-up with a primary care doctor or orthopedic doctor if not improving in the next week

## 2018-08-05 NOTE — ED Provider Notes (Signed)
[facility] DEPT Provider Note   CSN: [id number] Arrival date & time: 08/05/18  0908     History   Chief Complaint Chief Complaint  Patient presents with  . Leg Injury    HPI Kerri Osborn is a 57 y.o. female.  HPI Patient presents to the emergency room for evaluation of right lower leg pain and injury.  Patient states she was moving a bed about a week ago when it dropped and fell on her lower leg and foot.  Since that time she has had pain and discomfort in her lower leg and foot however the pain has been increasing in severity.  It hurts for her to try to walk and bear any weight.  She is concerned that she may have a hairline fracture.  She does have some pins-and-needles discomfort in her foot.  She denies any numbness.  No fevers or chills.  No other complaints or injuries. Past Medical History:  Diagnosis Date  . Allergy   . Arthritis   . Arthritis, rheumatoid ([place])   . Asthma   . Bipolar disorder ([place])   . Chicken pox   . Coronary artery disease   . Depression   . Diabetes mellitus   . Diverticulosis   . Dyspnea 04/13/2011   pfts 03/2011:  Normal FEV1 and FEV1/FVC Ct chest 2012:  No PE, normal parenchyma [name] 04/18/2011 with active symptoms:  Normal FEV1%, mild decrease in FVC due to restriction vs airtrapping, normal appearing       flow volume loop.    . Fatty liver   . Migraines   . Myocardial infarction ([place])   . Polyarthralgia - managed by Dr. Ouida Sills per her report 02/05/2013  . 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 Seasonal allergies     Patient Active Problem List   Diagnosis Date Noted  . Asthma 11/15/2017  . Type 2 diabetes mellitus with hyperglycemia, with long-term current use of insulin ([place]) 05/02/2016  . Hyperlipidemia 04/14/2016  . Obesity 04/14/2016  . Bipolar affective disorder ([place]) 02/05/2013  . Polyarthralgia - managed by Dr. Ouida Sills per her report 02/05/2013    Past Surgical History:  Procedure  Laterality Date  . ABDOMINAL HYSTERECTOMY    . APPENDECTOMY    . BREAST CYST EXCISION    . CESAREAN SECTION    . INNER EAR SURGERY     tubes in ears  . MYOMECTOMY    . TUBAL LIGATION       OB History   None      Home Medications    Prior to Admission medications   Medication Sig Start Date End Date Taking? Authorizing Provider  albuterol (PROVENTIL HFA;VENTOLIN HFA) 108 (90 Base) MCG/ACT inhaler Inhale 1-2 puffs into the lungs every 6 (six) hours as needed for wheezing or shortness of breath. 11/16/17   Lucretia Kern, DO  Blood Glucose Monitoring Suppl (ONE TOUCH ULTRA MINI) w/Device KIT Use to check sugar daily 04/27/17   Philemon Kingdom, MD  busPIRone (BUSPAR) 15 MG tablet Take 1/2 tablet by mouth 2 times daily. 11/17/14   Philemon Kingdom, MD  fluticasone (FLOVENT HFA) 44 MCG/ACT inhaler Inhale 2 puffs into the lungs 2 (two) times daily for 14 days. 11/16/17 11/30/17  Colin Benton R, DO  glucose blood (FREESTYLE LITE) test strip Use as instructed to check 2 times daily 04/28/17   Philemon Kingdom, MD  lamoTRIgine (LAMICTAL) 100 MG tablet Take 1 tablet (100 mg total)  by mouth daily. 11/17/14   Philemon Kingdom, MD  Lancets (FREESTYLE) lancets Use as instructed to check sugar 2 times daily 04/28/17   Philemon Kingdom, MD  metFORMIN (GLUCOPHAGE) 1000 MG tablet Take 1 tablet (1,000 mg total) by mouth 2 (two) times daily. Need appointment 05/17/18   Philemon Kingdom, MD  ondansetron (ZOFRAN ODT) 4 MG disintegrating tablet Take 1 tablet (4 mg total) by mouth every 8 (eight) hours as needed for nausea or vomiting. Patient not taking: Reported on 01/23/2018 01/02/18   Kinnie Feil, PA-C  sertraline (ZOLOFT) 100 MG tablet Take 1 tablet (100 mg total) by mouth daily. 11/17/14   Philemon Kingdom, MD    Family History Family History  Problem Relation Age of Onset  . Sudden death Mother   . Kidney failure Mother   . Sudden death Father   . Allergies Sister   . Allergies Daughter    . Allergies Daughter   . Asthma Daughter   . Asthma Daughter   . Arthritis Maternal Grandmother   . Hyperlipidemia Maternal Grandmother   . Diabetes Maternal Grandmother   . Hypertension Maternal Aunt   . Mental illness Unknown   . Colon cancer Neg Hx   . Rectal cancer Neg Hx   . Stomach cancer Neg Hx   . Neuropathy Neg Hx     Social History Social History   Tobacco Use  . Smoking status: Never Smoker  . Smokeless tobacco: Never Used  Substance Use Topics  . Alcohol use: No    Alcohol/week: 0.0 standard drinks  . Drug use: No     Allergies   Amoxicillin   Review of Systems Review of Systems  All other systems reviewed and are negative.    Physical Exam Updated Vital Signs BP (!) 175/88 (BP Location: Left Arm)   Pulse 85   Temp 98.2 F (36.8 C) (Oral)   Resp 17   SpO2 100%   Physical Exam  Genitourinary: Uterus normal. Pelvic exam was performed with patient supine. There is no rash, tenderness, lesion or injury on the right labia. There is no rash, tenderness, lesion or injury on the left labia. Uterus is not tender. Cervix exhibits no motion tenderness, no discharge and no friability. Right adnexum displays no mass, no tenderness and no fullness. Left adnexum displays no mass, no tenderness and no fullness. No erythema or tenderness in the vagina. No foreign body in the vagina. No signs of injury around the vagina. No vaginal discharge found.  Musculoskeletal:       Right lower leg: She exhibits tenderness, bony tenderness and swelling. She exhibits no deformity.       Right foot: There is tenderness. There is no swelling and no deformity.  Small contusion noted at the distal third of the tibia on the anterior aspect, there is palpation at that area, no surrounding erythema, patient also has tenderness diffusely on the dorsal surface of the foot and the lateral aspect, no gross deformities noted     ED Treatments / Results  Labs (all labs ordered are listed,  but only abnormal results are displayed) Labs Reviewed - No data to display  EKG None  Radiology Dg Tibia/fibula Right  Result Date: 08/05/2018 CLINICAL DATA:  Acute RIGHT LOWER leg pain following injury. Initial encounter. EXAM: RIGHT TIBIA AND FIBULA - 2 VIEW COMPARISON:  None. FINDINGS: No acute fracture, subluxation or dislocation. Anterior soft tissue swelling of the LOWER leg noted. No focal bony lesions are present. IMPRESSION: Soft  tissue swelling without acute bony abnormality. Electronically Signed   By: Margarette Canada M.D.   On: 08/05/2018 10:38   Dg Foot Complete Right  Result Date: 08/05/2018 CLINICAL DATA:  Acute RIGHT foot pain following injury. Initial encounter. EXAM: RIGHT FOOT COMPLETE - 3+ VIEW COMPARISON:  None. FINDINGS: There is no evidence of fracture or dislocation. There is no evidence of arthropathy or other focal bone abnormality. Soft tissues are unremarkable. IMPRESSION: Negative. Electronically Signed   By: Margarette Canada M.D.   On: 08/05/2018 10:39    Procedures Procedures (including critical care time)  Medications Ordered in ED Medications - No data to display   Initial Impression / Assessment and Plan / ED Course  I have reviewed the triage vital signs and the nursing notes.  Pertinent labs & imaging results that were available during my care of the patient were reviewed by me and considered in my medical decision making (see chart for details).    Patient's x-rays are negative for acute fracture.  History and exam did not suggest any infectious etiology.  Patient has no swelling or tenderness in the calf, I doubt DVT.  Patient's symptoms are consistent with persistent hematoma following injury to her lower leg.  Rest, ice, elevate, follow-up with orthopedics or primary doctor if not improving the next week.  Final Clinical Impressions(s) / ED Diagnoses   Final diagnoses:  Contusion of right lower extremity, initial encounter    ED Discharge Orders      None       Dorie Rank, MD 08/05/18 1117

## 2018-09-27 ENCOUNTER — Encounter

## 2018-10-03 ENCOUNTER — Encounter

## 2018-10-03 ENCOUNTER — Inpatient Hospital Stay: Admit: 2018-10-03 | Payer: TRICARE (CHAMPUS) | Attending: Internal Medicine

## 2018-10-03 DIAGNOSIS — Z1231 Encounter for screening mammogram for malignant neoplasm of breast: Secondary | ICD-10-CM

## 2019-01-28 IMAGING — CT CT ABD-PELV W/ CM
2 of 5 series · 17 of 46 positions shown, 19 images · IV contrast (ISOVUE)
Comparison: CT abdomen pelvis dated October 01, 2015.

CLINICAL DATA: Diarrhea and abdominal pain.

EXAM:
CT ABDOMEN AND PELVIS WITH CONTRAST
TECHNIQUE: Multidetector CT imaging of the abdomen and pelvis was performed
using the standard protocol following bolus administration of
intravenous contrast.
CONTRAST:  100mL 20NDYR-YII IOPAMIDOL (20NDYR-YII) INJECTION 61%

[Series 2: axial st · axial · 0.73mm/px · z∈[+881,+1261]mm · 14 of 88 slices shown, 16 images]
[im 6/88  soft-tissue]
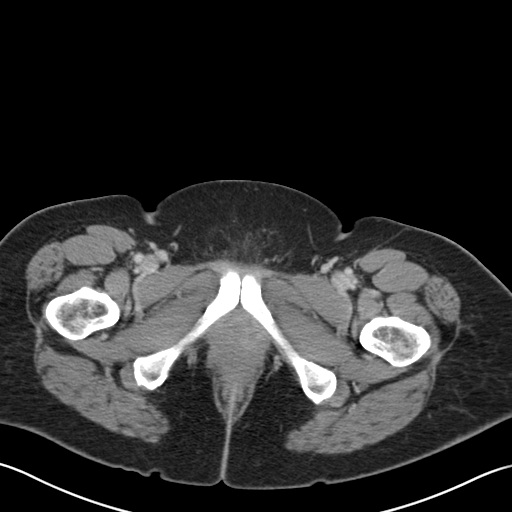
[im 6/88  bone]
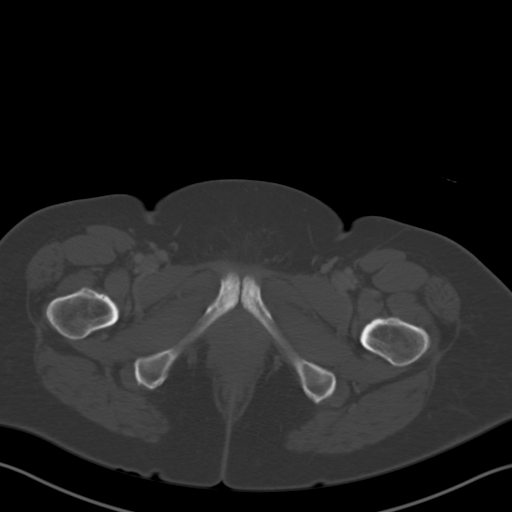
[im 12/88  soft-tissue]
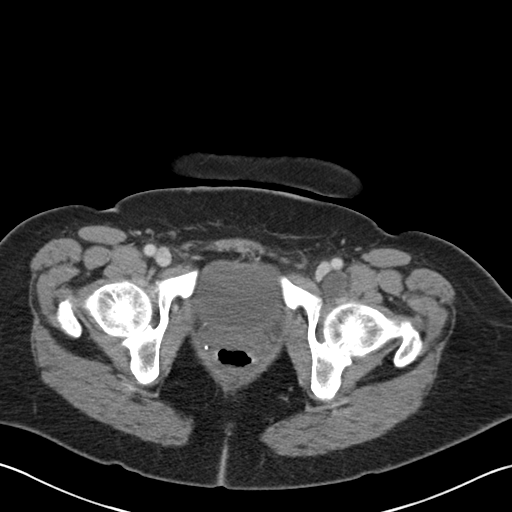
[im 18/88  soft-tissue]
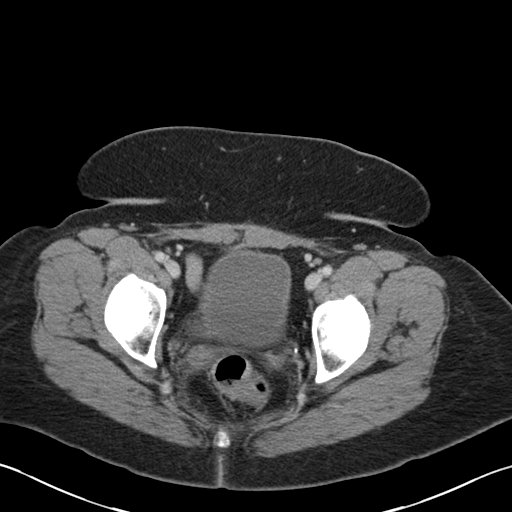
[im 24/88  soft-tissue]
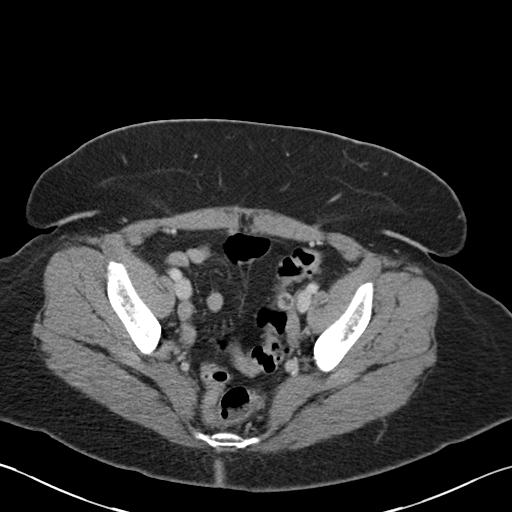
[im 30/88  soft-tissue]
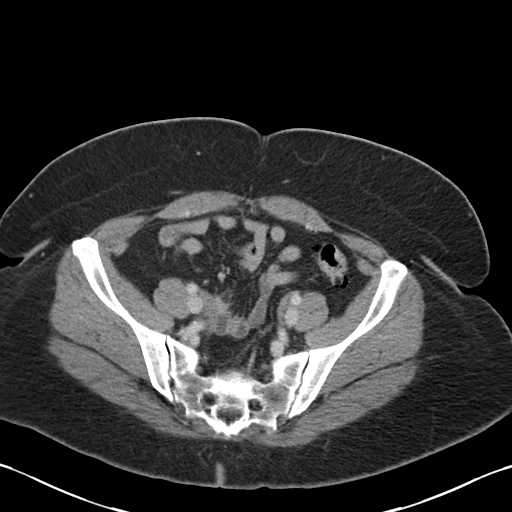
[im 35/88  soft-tissue]
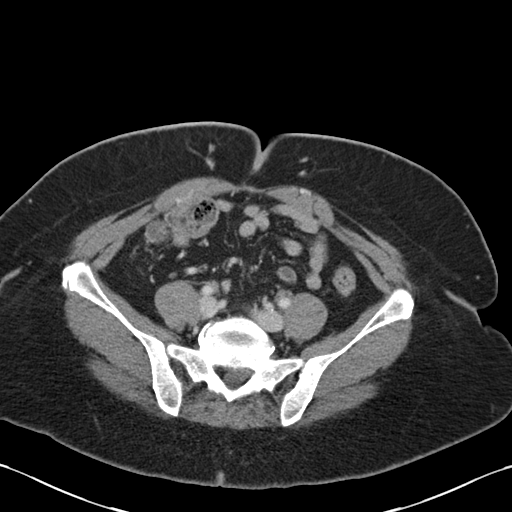
[im 41/88  soft-tissue]
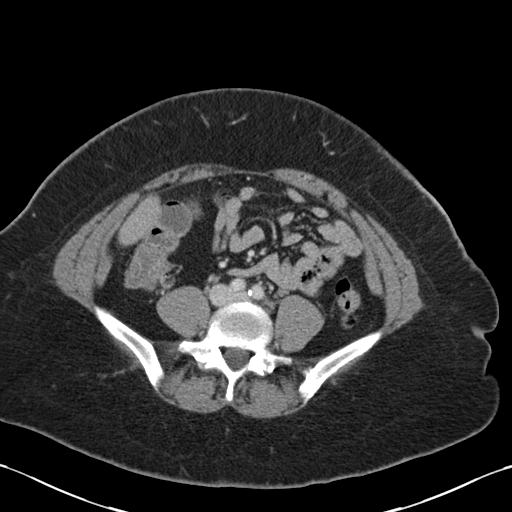
[im 47/88  soft-tissue]
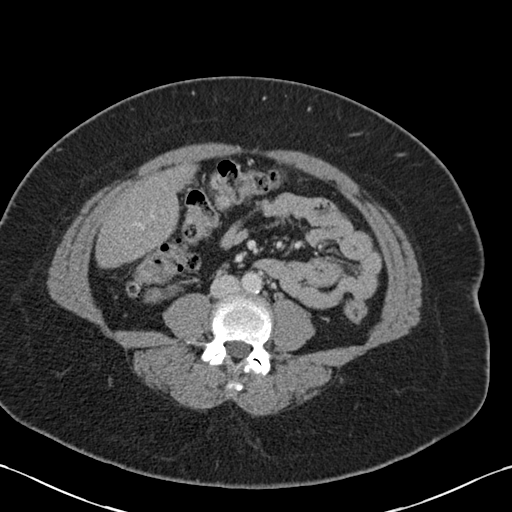
[im 53/88  soft-tissue]
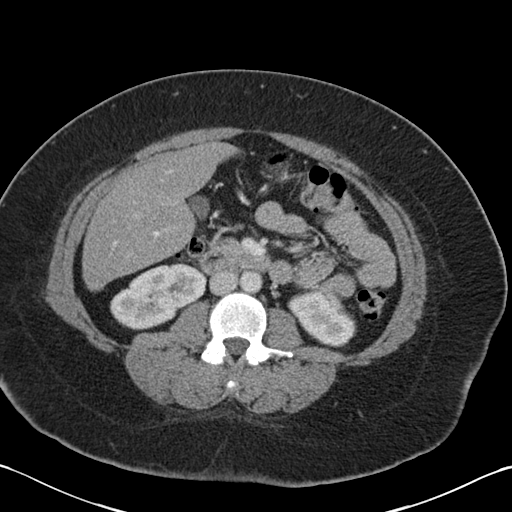
[im 53/88  bone]
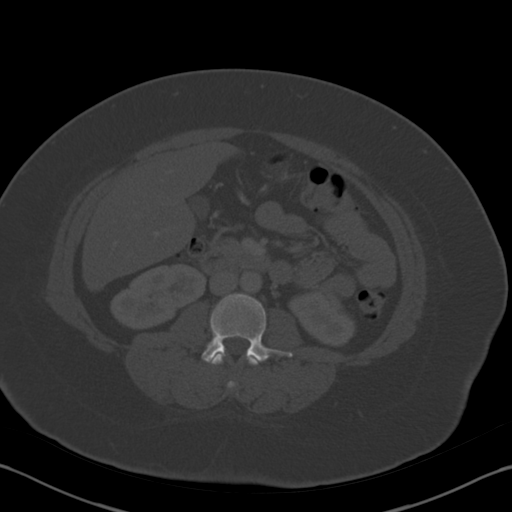
[im 59/88  soft-tissue]
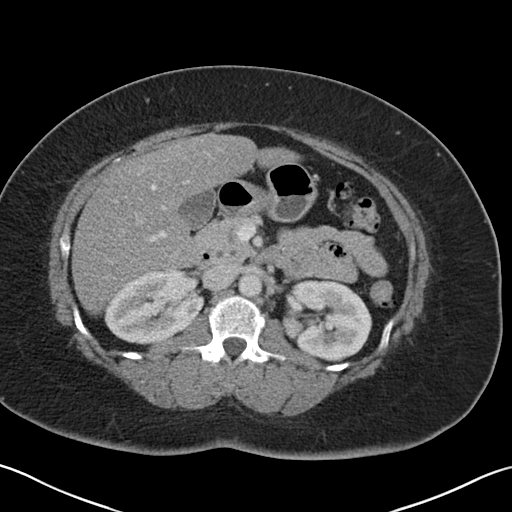
[im 64/88  soft-tissue]
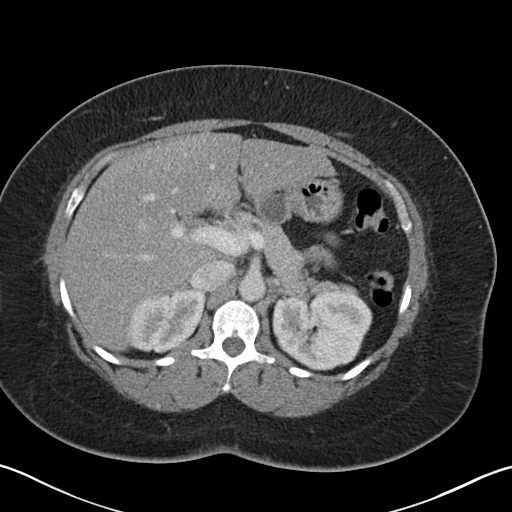
[im 70/88  soft-tissue]
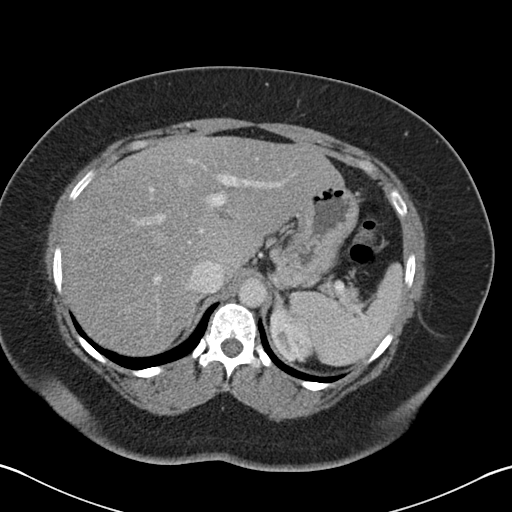
[im 76/88  soft-tissue]
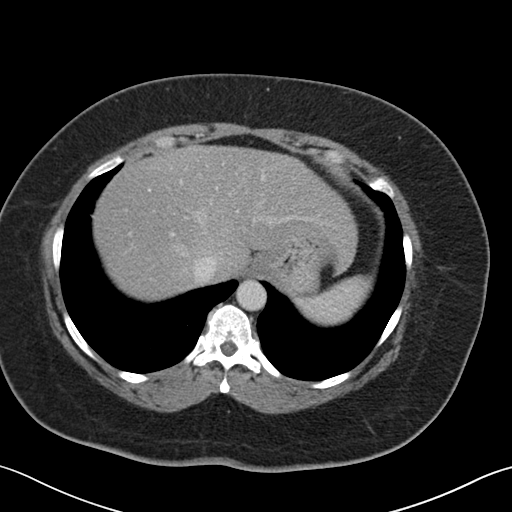
[im 82/88  soft-tissue]
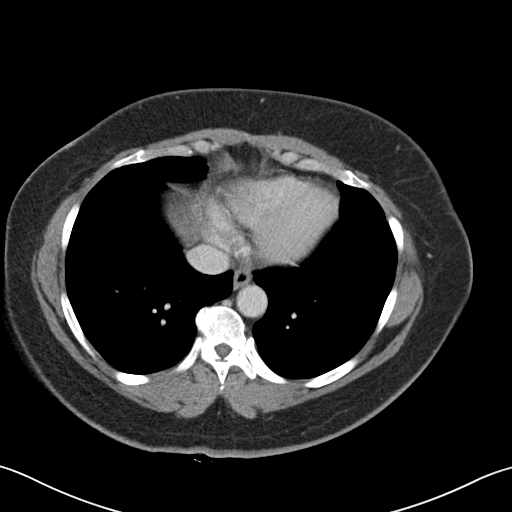

[Series 5: coronal st · coronal · 0.83mm/px · 3 of 106 slices shown]
[im 36/106  soft-tissue]
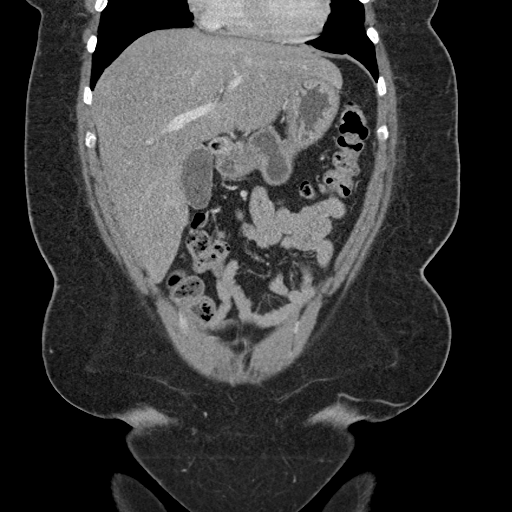
[im 47/106  soft-tissue]
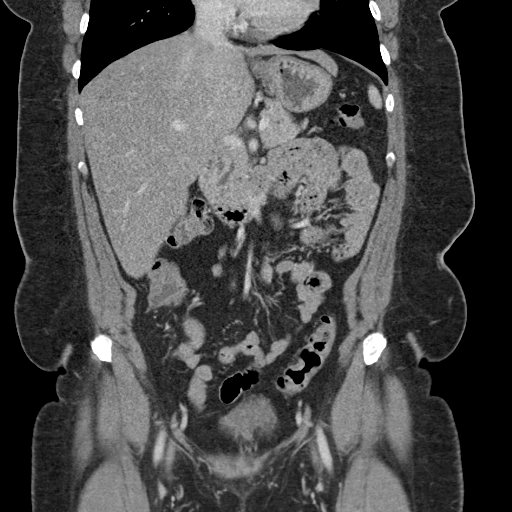
[im 59/106  soft-tissue]
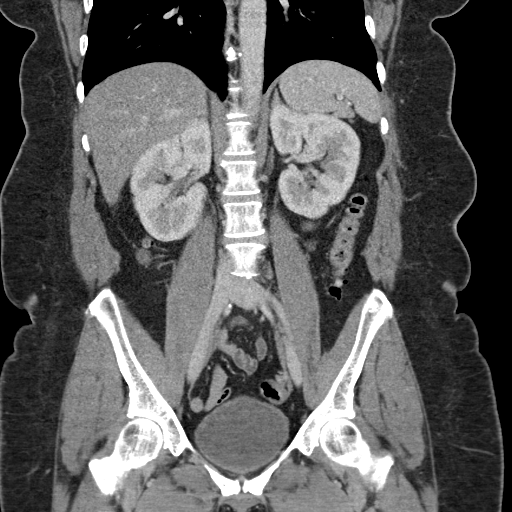

[17 of 46 positions shown; findings below may reference images not displayed]

FINDINGS: Lower chest: No acute abnormality.

Hepatobiliary: Hepatic steatosis. No focal liver abnormality is
seen. No gallstones, gallbladder wall thickening, or biliary
dilatation.

Pancreas: Prominence of the main pancreatic duct is unchanged. No
surrounding inflammatory changes.

Spleen: Normal in size without focal abnormality.

Adrenals/Urinary Tract: Adrenal glands are unremarkable. Kidneys are
normal, without renal calculi, focal lesion, or hydronephrosis.
Bladder is unremarkable.

Stomach/Bowel: Stomach is within normal limits. Appendix is not
visualized. No evidence of bowel wall thickening, distention, or
inflammatory changes. Mild left-sided colonic diverticulosis.

Vascular/Lymphatic: No significant vascular findings are present. No
enlarged abdominal or pelvic lymph nodes.

Reproductive: Status post hysterectomy. No adnexal masses.

Other: No free fluid or pneumoperitoneum.

Musculoskeletal: No acute or significant osseous findings. Stable
degenerative changes of the pubic symphysis.
IMPRESSION: 1.  No acute intra-abdominal process.
2. Colonic diverticulosis without evidence of diverticulitis.
3. Unchanged hepatic steatosis.
4. Mild prominence of the main pancreatic duct is unchanged since
September 2015.

## 2019-02-16 ENCOUNTER — Encounter: Payer: Self-pay | Admitting: Gastroenterology

## 2021-10-21 ENCOUNTER — Encounter: Attending: Cardiovascular Disease

## 2021-10-21 ENCOUNTER — Encounter

## 2021-10-21 ENCOUNTER — Encounter: Payer: TRICARE (CHAMPUS) | Attending: Cardiovascular Disease

## 2021-10-21 NOTE — Telephone Encounter (Signed)
PCP: None    Last appt: 10/21/2021  No future appointments.    Requested Prescriptions     Pending Prescriptions Disp Refills    aspirin delayed-release 81 mg tablet 90 Tablet 3     Sig: Take 1 Tablet by mouth daily.    nitroglycerin (NITROSTAT) 0.4 mg SL tablet 25 Tablet 5     Sig: 1 Tablet by SubLINGual route every five (5) minutes as needed for Chest Pain. Up to 3 doses.    isosorbide mononitrate ER (IMDUR) 30 mg tablet 90 Tablet 3     Sig: Take 1 Tablet by mouth every morning.

## 2021-10-21 NOTE — Progress Notes (Signed)
Identified pt with two pt identifiers(name and DOB). Reviewed record in preparation for visit and have obtained necessary documentation.    Colleen [name] presents today for   Chief Complaint   Patient presents with    New Patient     Establishing care        Pt c/o CHEST PAIN            Colleen [name] preferred language for health care discussion is english/other.    Personal Protective Equipment:   Engineer, maintenance was used including: mask-surgical and hands-gloves. Patient was placed on no precaution(s). Patient was masked.    Precautions:   Patient currently on None  Patient currently roomed with door closed.    Is someone accompanying this pt? no    Is the patient using any DME equipment during OV? no    Depression Screening:  3 most recent PHQ Screens 10/21/2021   Little interest or pleasure in doing things Not at all   Feeling down, depressed, irritable, or hopeless Not at all   Total Score PHQ 2 0       Learning Assessment:  No flowsheet data found.    Abuse Screening:  Abuse Screening Questionnaire 10/21/2021   Do you ever feel afraid of your partner? N   Are you in a relationship with someone who physically or mentally threatens you? N   Is it safe for you to go home? Y       Fall Risk  No flowsheet data found.    Pt currently taking Anticoagulant therapy? no  Pt currently taking Antiplatelet therapy? no    Coordination of Care:  1. Have you been to the ER, urgent care clinic since your last visit? Hospitalized since your last visit? Yes, [facility]. 09-20-21. Near syncope    2. Have you seen or consulted any other health care providers outside of the [facility] since your last visit? Include any pap smears or colon screening. yes      Please see Red banners under Allergies and Med Rec to remove outside inquires. All correct information has been verified with patient and added to chart.     Medication's patient's would liked removed has been marked not taking to be removed per Verbal order  and read back per Tretha Sciara, MD

## 2021-10-21 NOTE — Progress Notes (Signed)
Progress Notes by Tretha Sciara, MD at 10/21/21 0800                Author: Tretha Sciara, MD  Service: --  Author Type: Physician       Filed: 10/21/21 0936  Encounter Date: 10/21/2021  Status: Signed          Editor: Tretha Sciara, MD (Physician)                                                                            [facility]      Colleen [name] is 60 year old female with diabetes, hypertension, asthma    Patient is here today for cardiac evaluation  She denies any prior history of MI  Patient was sent to me for evaluation of chest pain.  According to patient she recently  went to emerge department after having a sudden onset of chest pressure and heaviness which was associated with some epigastric discomfort and nausea.  She felt somewhat sweaty.  She went to emergency department.  Details are not available.  She was told  that she will need a coronary evaluation with possible stress test as outpatient.   She was also was told that according to the records from [facility], she was diagnosed with cardiomyopathy when she was living in [place] several years ago.   Do not have that record   Since patient has gone home, she has been having on and off some chest discomfort and sometimes feel like tightening pressure and heaviness like a small dog sitting on her chest.  At times that has radiated to the left arm.   That last for several minutes.  She also has been having some sharp pain as well without any radiation.  She denies any palpitation, presyncope or syncope   Denies any nausea, vomiting, abdominal pain, fever, chills, sputum production. No hematuria or other bleeding complaints        Past Medical History:        Diagnosis  Date         ?  Asthma       ?  Cardiomyopathy (HCC)       ?  Diabetes (HCC)           ?  HTN (hypertension)             Review of Systems:   Cardiac symptoms as noted above in HPI. All others negative.   Denies fatigue, malaise, skin  rash, joint pain, blurring vision, photophobia, neck pain, hemoptysis, chronic cough, nausea, vomiting, hematuria, burning micturition, BRBPR, chronic headaches.        Current Outpatient Medications        Medication  Sig         ?  Janumet XR 50-1,000 mg TM24  Take 1 Tablet by mouth daily.     ?  gabapentin (NEURONTIN) 300 mg capsule  Take 300 mg by mouth two (2) times a day.     ?  losartan (COZAAR) 100 mg tablet  Take 100 mg by mouth daily.     ?  exenatide microspheres 2 mg/0.65 mL pnij  2 mg by SubCUTAneous route every seven (  7) days.     ?  lidocaine 1.8 % ptmd  by Apply Externally route as needed.     ?  albuterol (ProAir HFA) 90 mcg/actuation inhaler  Take 2 Puffs by inhalation every four (4) hours as needed for Wheezing.     ?  ibuprofen (MOTRIN) 800 mg tablet  Take 800 mg by mouth every six (6) hours as needed for Pain.         ?  acetaminophen (Tylenol Extra Strength) 500 mg tablet  Take 500 mg by mouth every six (6) hours as needed for Pain.          No current facility-administered medications for this visit.             Past Surgical History:         Procedure  Laterality  Date          ?  HX BREAST BIOPSY  Right            benign more than 30 years ago           Allergies and Sensitivities:     Allergies        Allergen  Reactions         ?  Amoxicillin  Itching           Family History:   No family history on file.      Social History:     Social History          Tobacco Use         ?  Smoking status:  Never         ?  Smokeless tobacco:  Never        She  reports that she has never smoked. She has never used smokeless tobacco.  She  has no history on file for alcohol use.      Physical Exam:     BP Readings from Last 3 Encounters:        10/21/21  138/82              Pulse Readings from Last 3 Encounters:        10/21/21  64               Wt Readings from Last 3 Encounters:        10/21/21  91.2 kg (201 lb)           Constitutional: Oriented to person, place, and time.    HENT: Head: Normocephalic  and atraumatic. Eyes: Conjunctivae and extraocular motions are normal.    Neck: No JVD present. Carotid bruit is not appreciated.    Cardiovascular: Regular rhythm.   No murmur, gallop or rubs appreciated   Lung: Breath sounds normal. No respiratory distress. No ronchi or rales appreciated   Abdominal: No tenderness. No rebound and no guarding.   Musculoskeletal: There is no lower extremity edema. No cynosis  Lymphadenopathy:  No cervical or supraclavicular adenopathy appriciated.   Neurological: No gross motor deficit noted.   Skin: No visible skin rash noted.    No Ear discharge noted  Psychiatric: Normal mood and affect.       LABS:    @No  results found for: WBC, WBCLT, HGBPOC, HGB, HGBP, HCTPOC, HCT, PHCT, RBCH, PLT, MCV, HGBEXT, HCTEXT, PLTEXT   No results found for: NA, K, CL, CO2, GLU, BUN, CREA   No flowsheet data found.   No results found for: ALT   No  results found for: HBA1C, HBA1CPOC, HBA1CEXT   No results found for: TSH, TSH2, TSH3, TSHP, TSHEXT      EKG:  10/21/2021: Sinus rhythm at 64 bpm.  Possible old septal infarct.  No ST changes of ischemia.      STRESS TEST (EST, PHARM, NUC, ECHO etc)      CATHETERIZATION      IMPRESSION & PLAN:   Colleen [name] is 60 year old female      Chest pain:  Patient has been having this chest discomfort on and off for last 4 weeks.  Before that she never had a chest discomfort  Part of it appears to be exertional in nature and somewhat concerning for angina which appears to be increasing  in intensity and frequency.   She has another component of chest pain which is sharp in nature   Patient has a strong family history with brother who just had 3 coronary stent 3 weeks ago.  She is also diabetic and she has hypertension   Considering her risk factors and strong family history as well as ongoing symptoms concerning for angina which appears to be increasing in intensity frequency, I recommend coronary evaluation   Start aspirin 81 mg daily.  We will provide sublingual  nitroglycerin.  I will also start patient on Imdur 30 mg daily.   Advised against exertional activity.  Advised patient to call 911 if she has any chest pain not responding to nitroglycerin  Discussed regarding management strategy which includes medical management vs. Ischemia evaluation ( non-invasive vs. Invasive).    Risk, benefit and alternatives of each strategy discussed in detail.  Risk, benefit, complication of LHC and possible PCI ( including but not limited to bleeding, vascular trauma requiring surgery,  infection, heart failure, stroke, MI, emergent bypass  surgery, severe allergic reactions, kidney failure, dialysis and death ) were discussed with and and willing to proceed with procedure. Will be using moderate sedation   By stating these are possible risks, this does not exclude the potential for  additional risks not named here.      Hypertension: BP 140/82.  Currently on losartan.  We will start Imdur 30 mg daily as mentioned above.  Side effect discussed.   Will order echo to rule out hypertensive cardiovascular heart disease especially with ongoing symptoms of chest pain    Diabetes:  Goal hemoglobin A1c less than 7 is recommended from cardiovascular standpoint.  I will defer management to PCP      This plan was discussed with patient who is in agreement.      Thank you for allowing me to participate in patient care. Please feel free to call me if you have any question or concern.       Earl Lites, MD   Please note: This document has been produced using voice recognition software. Unrecognized errors in transcription may be present.

## 2021-10-22 NOTE — Telephone Encounter (Signed)
Patient calling again for an update and states that she will be taking the other pain meds she has while waiting

## 2021-10-22 NOTE — Telephone Encounter (Signed)
Refill request pended to provider, will monitor for completion.

## 2021-10-22 NOTE — Telephone Encounter (Signed)
Noted, refills still pending.

## 2021-10-22 NOTE — Telephone Encounter (Signed)
Patient has been calling bc she has been going to the pharmacy to pick up meds from her appt. Was advised by provider to take the nitro, aspirin etc. Patient wondering why he stressed it so much if its not signed yet. Patient wanted to pick it up before it closes and before the weekend       Please call patient when signed.

## 2021-10-23 MED ORDER — ASPIRIN 81 MG TAB, DELAYED RELEASE
81 mg | ORAL_TABLET | Freq: Every day | ORAL | 3 refills | Status: AC
Start: 2021-10-23 — End: ?

## 2021-10-23 MED ORDER — NITROGLYCERIN 0.4 MG SUBLINGUAL TAB
0.4 mg | ORAL_TABLET | SUBLINGUAL | 5 refills | Status: AC | PRN
Start: 2021-10-23 — End: ?

## 2021-10-23 MED ORDER — ISOSORBIDE MONONITRATE SR 30 MG 24 HR TAB
30 mg | ORAL_TABLET | ORAL | 3 refills | Status: AC
Start: 2021-10-23 — End: ?

## 2021-10-25 NOTE — Telephone Encounter (Signed)
Meds signed 10/23/2021. Will close encounter

## 2021-11-25 ENCOUNTER — Ambulatory Visit: Payer: TRICARE (CHAMPUS)

## 2021-11-25 ENCOUNTER — Encounter: Attending: Cardiovascular Disease

## 2022-09-02 LAB — HEMOGLOBIN A1C
Estimated Avg Glucose, External: 202 mg/dL — ABNORMAL HIGH (ref 91–123)
Hemoglobin A1C, External: 8.7 % — ABNORMAL HIGH (ref 4.8–5.6)

## 2023-05-10 LAB — HEMOGLOBIN A1C
Estimated Avg Glucose, External: 270 mg/dL — ABNORMAL HIGH (ref 91–123)
Hemoglobin A1C, External: 11.1 % — ABNORMAL HIGH (ref 4.8–5.6)

## 2023-05-31 NOTE — Telephone Encounter (Signed)
LVM for patient to return call to office to schedule new patient appt per referral .

## 2023-07-24 ENCOUNTER — Ambulatory Visit: Admit: 2023-07-24 | Discharge: 2023-07-24 | Payer: TRICARE (CHAMPUS) | Attending: Gastroenterology | Primary: Surgical

## 2023-07-24 ENCOUNTER — Inpatient Hospital Stay: Admit: 2023-07-24 | Payer: TRICARE (CHAMPUS) | Primary: Surgical

## 2023-07-24 DIAGNOSIS — R748 Abnormal levels of other serum enzymes: Secondary | ICD-10-CM

## 2023-07-24 LAB — CBC WITH AUTO DIFFERENTIAL
Basophils %: 0 % (ref 0–2)
Basophils Absolute: 0 10*3/uL (ref 0.0–0.1)
Eosinophils %: 1 % (ref 0–5)
Eosinophils Absolute: 0.2 10*3/uL (ref 0.0–0.4)
Hematocrit: 36.6 % (ref 35.0–45.0)
Hemoglobin: 11.7 g/dL — ABNORMAL LOW (ref 12.0–16.0)
Immature Granulocytes %: 0 % (ref 0.0–0.5)
Immature Granulocytes Absolute: 0 10*3/uL (ref 0.00–0.04)
Lymphocytes %: 36 % (ref 21–52)
Lymphocytes Absolute: 4 10*3/uL — ABNORMAL HIGH (ref 0.9–3.6)
MCH: 27.6 PG (ref 24.0–34.0)
MCHC: 32 g/dL (ref 31.0–37.0)
MCV: 86.3 FL (ref 78.0–100.0)
MPV: 11.7 FL (ref 9.2–11.8)
Monocytes %: 5 % (ref 3–10)
Monocytes Absolute: 0.6 10*3/uL (ref 0.05–1.2)
Neutrophils %: 57 % (ref 40–73)
Neutrophils Absolute: 6.4 10*3/uL (ref 1.8–8.0)
Nucleated RBCs: 0 PER 100 WBC
Platelets: 217 10*3/uL (ref 135–420)
RBC: 4.24 M/uL (ref 4.20–5.30)
RDW: 13.1 % (ref 11.6–14.5)
WBC: 11.3 10*3/uL (ref 4.6–13.2)
nRBC: 0 10*3/uL (ref 0.00–0.01)

## 2023-07-24 LAB — HEPATIC FUNCTION PANEL
ALT: 40 U/L (ref 13–56)
AST: 27 U/L (ref 10–38)
Albumin/Globulin Ratio: 1.1 (ref 0.8–1.7)
Albumin: 3.7 g/dL (ref 3.4–5.0)
Alk Phosphatase: 146 U/L — ABNORMAL HIGH (ref 45–117)
Bilirubin, Direct: 0.1 MG/DL (ref 0.0–0.2)
Globulin: 3.5 g/dL (ref 2.0–4.0)
Total Bilirubin: 0.6 MG/DL (ref 0.2–1.0)
Total Protein: 7.2 g/dL (ref 6.4–8.2)

## 2023-07-24 LAB — BASIC METABOLIC PANEL
Anion Gap: 6 mmol/L (ref 3.0–18)
BUN/Creatinine Ratio: 25 — ABNORMAL HIGH (ref 12–20)
BUN: 21 MG/DL — ABNORMAL HIGH (ref 7.0–18)
CO2: 28 mmol/L (ref 21–32)
Calcium: 9.2 MG/DL (ref 8.5–10.1)
Chloride: 106 mmol/L (ref 100–111)
Creatinine: 0.83 MG/DL (ref 0.6–1.3)
Est, Glom Filt Rate: 81 mL/min/{1.73_m2} (ref >60)
Glucose: 296 mg/dL — ABNORMAL HIGH (ref 74–99)
Potassium: 4 mmol/L (ref 3.5–5.5)
Sodium: 140 mmol/L (ref 136–145)

## 2023-07-24 LAB — PROTIME-INR
INR: 1 (ref 0.9–1.1)
Protime: 13.1 s (ref 11.9–14.9)

## 2023-07-24 NOTE — Progress Notes (Unsigned)
[facility]    RUQ pain with eating  GERD  Sthma  T2DM  Athriritis bipolar     Arm pain, from pinched nevere in neck  No limitsations    Dad died ??  Mom died ESRD    [place]  2  No  No  Massage therapoist aout repotal  Tener RUQ  Pleno Tm    FS.  334, 8.5, 15%

## 2023-07-25 LAB — HEPATITIS B SURFACE ANTIBODY
Hep B S Ab Interp: POSITIVE
Hep B S Ab: 140.59 m[IU]/mL (ref >10.0)

## 2023-07-25 LAB — HEPATITIS B SURFACE ANTIGEN
Hep B S Ag Interp: NEGATIVE
Hepatitis B Surface Ag: <0.1 Index (ref <1.00)

## 2023-07-25 LAB — ALPHA-1-ANTITRYPSIN: A-1 Antitrypsin: 136 mg/dL (ref 101–187)

## 2023-07-26 LAB — HEPATITIS B CORE ANTIBODY, TOTAL: Hep B Core Total Ab: NEGATIVE

## 2023-07-26 LAB — ANTI-NEUTROPHILIC CYTOPLASMIC ANTIBODY
Atypical pANCA: <1:20 {titer}
Cytoplasmic (C-ANCA): <1:20 {titer}
Perinuclear (P-ANCA): <1:20 {titer}

## 2023-07-26 LAB — HCV INTERPRETATION

## 2023-07-26 LAB — HEPATITIS C AB, RFLX TO QT BY PCR: Hepatitis C Ab: NONREACTIVE s/co ratio

## 2023-08-31 NOTE — Progress Notes (Signed)
 08/31/2023  Colleen [name]    The patient is here today for a weight check.    SMW NURSE

## 2023-09-18 NOTE — Progress Notes (Signed)
 Office Visit - Service Date: 09/18/23    (E11.9) Type 2 diabetes mellitus without complication, without long-term current use of insulin  (HCC)    (Z71.3) Nutritional counseling    (E66.9) Non morbid obesity    (Z68.32) BMI 32.0-32.9,adult      Assessment & Plan       Colleen [name] will cont w/ 'bland diet' but I have enc her to count cals keeping in mind her recent IC of 1100's; aim cals 1000-1100 and start monitoring total carbs aiming for less than 80gr daily  F/u endocrinology  Cont metformin  We discussed sources of hidden calories, portion sizes, and how snacking even small amounts (such as nuts) can add up to too many calories daily.   MUST get in at least 60gr protein daily; < 60gr IS MALNUTRITION and can result in decreased metabolism and difficulty losing weight   We discussed tips for handling Halloween and handout re: this provided and reviewed    Handout re: Thanksgiving was provided and reviewed and strategies to help her deal with eating habits on that day were devised.   The average American gains 13lb between Halloween and New Year's so if the pt can get through the holiday season w/o gaining, I would consider this a great success   Enc to plan tomorrow's menu the night before to help stay on track w/ eating plan and to help w/ compliance and weight loss   Keep list of any obstacles that may occur until next OV that need to be addressed to further assist in weight loss endeavors   Enc pt to reach out to me thru 'mychart' if has any questions or concerns            Ex:  Encourage CV activity such as walking, daily to help establish a daily routine that can be carried on for a lifetime and to help develop healthy habits. Daily CV activity for a miniumum of has been shown to be very beneficial for the CV system                       Total time doing patient education/charting for this visit was  Chief Complaint   Weight problem      History of Present Illness     Weight/eating plan:  Weight  up 4.5lb since last OV 50mo ago; currently at 0.5lb loss since starting prgm 16mo ago which is 0.26% TBR  prescribed atkins plan by her GI team; recently dx w/ 'fatty liver' few mo ago and now says this is improving   Currently changed to 'bland diet' w/ guidelines provided by her PCP    Pt followed by endocrinology and r/s trulicity currently at 1.5mg  weekly dose     Social:  Widowed; works as a teacher, adult education. Children are overweight according to NP paperwork.  Is planning to move to [place] for new job but plans to keep all medical providers in [place].   Sees therapist reguarly.  Has upcoming vacation in Oct to [place] provided by her children to celebrate upcoming 62th bday.        Ex:  Has a 'total gym' at home. Did see our ex physiotherapist and has an ex rx. Not working out reguarly at this point.           AOMS:      Metformin 1000mg  BID  Trulicity 1.5mg  dose     AOMs contraindicated:   None noted  Obesogenic meds pt is taking:   Pt states she is taking gabapentin ; dose not known    Home Medications     Outpatient Medications Marked as Taking for the 09/18/23 encounter (Office Visit) with Duane Katheryn CROME, NP   Medication Sig Dispense Refill   . TRULICITY 1.5 mg/0.5 mL SC Pen Injector refrigerateCheck with your doctor before becoming pregnant.Store in designer, industrial/product.         Past Histories     The patient's medical, family, and social history (including tobacco usage) were reviewed and updated as appropriate.    Tobacco History reviewed:  Social History     Tobacco Use   Smoking Status Never   Smokeless Tobacco Never      Counseling given: Not Answered      Physical Exam   Vital Signs: BP 120/80   Pulse 89   Temp 97.5 F (36.4 C) (Skin)   Ht 5' 4 (1.626 m)   Wt 86.9 kg (191 lb 8 oz)   SpO2 98%   BMI 32.87 kg/m      Physical Exam         Recent Results & Studies     Lab Results   Component Value Date    HGBA1C 11.1 (H) 05/10/2023                 I have reviewed information entered by the clinical  staff and/or patient and verified it as accurate or edited where necessary.    Signature:  Katheryn CROME Duane, NP  [facility]  Dept: [phone]  Dept Fax: [phone]      This is a dictated note by Katheryn L. Duane, FNP using a computer transcription program. Although this note has been proofread, errors in spelling or grammar may exist

## 2023-10-24 ENCOUNTER — Ambulatory Visit: Payer: TRICARE (CHAMPUS) | Attending: Adult Health | Primary: Surgical

## 2024-01-19 DIAGNOSIS — M4800 Spinal stenosis, site unspecified: Secondary | ICD-10-CM

## 2024-01-19 DIAGNOSIS — Z4789 Encounter for other orthopedic aftercare: Secondary | ICD-10-CM

## 2024-01-19 NOTE — Progress Notes (Addendum)
Pt received via stretcher, made comfortable on bed. No report received yet. Pt able to stand up with assist  to bed. VS taken on the board

## 2024-01-20 ENCOUNTER — Inpatient Hospital Stay
Admit: 2024-01-20 | Discharge: 2024-01-26 | Disposition: A | Discharge status: Home / Self Care | Payer: TRICARE (CHAMPUS) | Source: Other Acute Inpatient Hospital | Attending: Emergency Medicine | Admitting: Emergency Medicine

## 2024-01-20 LAB — CBC WITH AUTO DIFFERENTIAL
Basophils %: 0.1 % (ref 0.0–2.0)
Basophils Absolute: 0.01 10*3/uL (ref 0.00–0.10)
Eosinophils %: 2 % (ref 0.0–5.0)
Eosinophils Absolute: 0.27 10*3/uL (ref 0.00–0.40)
Hematocrit: 32.8 % — ABNORMAL LOW (ref 35.0–45.0)
Hemoglobin: 10.4 g/dL — ABNORMAL LOW (ref 12.0–16.0)
Immature Granulocytes %: 0.6 % — ABNORMAL HIGH (ref 0.0–0.5)
Immature Granulocytes Absolute: 0.08 10*3/uL — ABNORMAL HIGH (ref 0.00–0.04)
Lymphocytes %: 27.6 % (ref 21.0–52.0)
Lymphocytes Absolute: 3.73 10*3/uL — ABNORMAL HIGH (ref 0.90–3.60)
MCH: 27.4 pg (ref 24.0–34.0)
MCHC: 31.7 g/dL (ref 31.0–37.0)
MCV: 86.3 fL (ref 78.0–100.0)
MPV: 10.8 fL (ref 9.2–11.8)
Monocytes %: 7.2 % (ref 3.0–10.0)
Monocytes Absolute: 0.97 10*3/uL (ref 0.05–1.20)
Neutrophils %: 62.5 % (ref 40.0–73.0)
Neutrophils Absolute: 8.45 10*3/uL — ABNORMAL HIGH (ref 1.80–8.00)
Nucleated RBCs: 0 /100{WBCs}
Platelets: 227 10*3/uL (ref 135–420)
RBC: 3.8 M/uL — ABNORMAL LOW (ref 4.20–5.30)
RDW: 14.4 % (ref 11.6–14.5)
WBC: 13.5 10*3/uL — ABNORMAL HIGH (ref 4.6–13.2)
nRBC: 0 10*3/uL (ref 0.00–0.01)

## 2024-01-20 LAB — BASIC METABOLIC PANEL
Anion Gap: 4 mmol/L (ref 3.0–18)
BUN/Creatinine Ratio: 34 — ABNORMAL HIGH (ref 12–20)
BUN: 21 mg/dL — ABNORMAL HIGH (ref 7.0–18)
CO2: 27 mmol/L (ref 21–32)
Calcium: 8.9 mg/dL (ref 8.5–10.1)
Chloride: 111 mmol/L (ref 100–111)
Creatinine: 0.62 mg/dL (ref 0.6–1.3)
Est, Glom Filt Rate: >90 mL/min/{1.73_m2} (ref >60)
Glucose: 150 mg/dL — ABNORMAL HIGH (ref 74–99)
Potassium: 3.8 mmol/L (ref 3.5–5.5)
Sodium: 142 mmol/L (ref 136–145)

## 2024-01-20 LAB — MAGNESIUM: Magnesium: 2.1 mg/dL (ref 1.6–2.6)

## 2024-01-20 LAB — POCT GLUCOSE
POC Glucose: 300 mg/dL — ABNORMAL HIGH (ref 70–110)
POC Glucose: 125 mg/dL — ABNORMAL HIGH (ref 70–110)
POC Glucose: 206 mg/dL — ABNORMAL HIGH (ref 70–110)
POC Glucose: 268 mg/dL — ABNORMAL HIGH (ref 70–110)

## 2024-01-20 LAB — HEMOGLOBIN A1C
Estimated Avg Glucose: 154 mg/dL
Hemoglobin A1C: 7 % — ABNORMAL HIGH (ref 4.2–5.6)

## 2024-01-20 MED ORDER — SERTRALINE HCL 50 MG PO TABS
50 MG | Freq: Every day | ORAL | Status: DC
Start: 2024-01-20 — End: 2024-01-26
  Administered 2024-01-20 – 2024-01-26 (×7): 50 mg via ORAL

## 2024-01-20 MED ORDER — POLYETHYLENE GLYCOL 3350 17 G PO PACK
17 g | Freq: Every day | ORAL | Status: DC
Start: 2024-01-20 — End: 2024-01-26
  Administered 2024-01-26: 14:00:00 17 g via ORAL

## 2024-01-20 MED ORDER — NALOXONE HCL 0.4 MG/ML IJ SOLN
0.4 MG/ML | INTRAMUSCULAR | Status: DC | PRN
Start: 2024-01-20 — End: 2024-01-26

## 2024-01-20 MED ORDER — DEXTROSE 10 % IV SOLN
10 % | INTRAVENOUS | Status: DC | PRN
Start: 2024-01-20 — End: 2024-01-26

## 2024-01-20 MED ORDER — BUSPIRONE HCL 5 MG PO TABS
5 MG | Freq: Every day | ORAL | Status: DC
Start: 2024-01-20 — End: 2024-01-26
  Administered 2024-01-20 – 2024-01-26 (×7): 15 mg via ORAL

## 2024-01-20 MED ORDER — LAMOTRIGINE 25 MG PO TABS
25 MG | Freq: Two times a day (BID) | ORAL | Status: DC
Start: 2024-01-20 — End: 2024-01-26
  Administered 2024-01-20 – 2024-01-26 (×14): 25 mg via ORAL

## 2024-01-20 MED ORDER — DEXTROSE 10 % IV BOLUS
INTRAVENOUS | Status: DC | PRN
Start: 2024-01-20 — End: 2024-01-26

## 2024-01-20 MED ORDER — GLUCAGON EMERGENCY 1 MG/ML IJ SOLR
1 MG/ML | INTRAMUSCULAR | Status: DC | PRN
Start: 2024-01-20 — End: 2024-01-26

## 2024-01-20 MED ORDER — BUDESONIDE 0.5 MG/2ML IN SUSP
0.52 MG/2ML | Freq: Two times a day (BID) | RESPIRATORY_TRACT | Status: DC
Start: 2024-01-20 — End: 2024-01-26
  Administered 2024-01-20 – 2024-01-26 (×4): 1000 mg via RESPIRATORY_TRACT

## 2024-01-20 MED ORDER — OYSTER SHELL CALCIUM W/D 500-5 MG-MCG PO TABS
500-5- MG-MCG | Freq: Two times a day (BID) | ORAL | Status: DC
Start: 2024-01-20 — End: 2024-01-26
  Administered 2024-01-20 – 2024-01-26 (×13): 1 via ORAL

## 2024-01-20 MED ORDER — GABAPENTIN 300 MG PO CAPS
300 MG | Freq: Every evening | ORAL | Status: DC
Start: 2024-01-20 — End: 2024-01-26
  Administered 2024-01-20 – 2024-01-26 (×7): 300 mg via ORAL

## 2024-01-20 MED ORDER — ALBUTEROL SULFATE (2.5 MG/3ML) 0.083% IN NEBU
RESPIRATORY_TRACT | Status: DC | PRN
Start: 2024-01-20 — End: 2024-01-26

## 2024-01-20 MED ORDER — ATORVASTATIN CALCIUM 40 MG PO TABS
40 MG | Freq: Every day | ORAL | Status: DC
Start: 2024-01-20 — End: 2024-01-26
  Administered 2024-01-20 – 2024-01-26 (×7): 40 mg via ORAL

## 2024-01-20 MED ORDER — ACETAMINOPHEN 325 MG PO TABS
325 MG | ORAL | Status: DC | PRN
Start: 2024-01-20 — End: 2024-01-26
  Administered 2024-01-20 – 2024-01-26 (×15): 650 mg via ORAL

## 2024-01-20 MED ORDER — INSULIN LISPRO 100 UNIT/ML IJ SOLN
100 UNIT/ML | Freq: Four times a day (QID) | INTRAMUSCULAR | Status: DC
Start: 2024-01-20 — End: 2024-01-26
  Administered 2024-01-20: 22:00:00 2 [IU] via SUBCUTANEOUS
  Administered 2024-01-20: 18:00:00 1 [IU] via SUBCUTANEOUS
  Administered 2024-01-20: 04:00:00 3 [IU] via SUBCUTANEOUS
  Administered 2024-01-21 – 2024-01-22 (×2): 2 [IU] via SUBCUTANEOUS
  Administered 2024-01-22: 22:00:00 1 [IU] via SUBCUTANEOUS
  Administered 2024-01-22: 02:00:00 2 [IU] via SUBCUTANEOUS
  Administered 2024-01-23 – 2024-01-25 (×4): 1 [IU] via SUBCUTANEOUS
  Administered 2024-01-26: 03:00:00 2 [IU] via SUBCUTANEOUS

## 2024-01-20 MED ORDER — BISACODYL 5 MG PO TBEC
5 MG | Freq: Every day | ORAL | Status: DC | PRN
Start: 2024-01-20 — End: 2024-01-26

## 2024-01-20 MED ORDER — PANTOPRAZOLE SODIUM 40 MG PO TBEC
40 MG | Freq: Every day | ORAL | Status: DC
Start: 2024-01-20 — End: 2024-01-26
  Administered 2024-01-20 – 2024-01-26 (×7): 40 mg via ORAL

## 2024-01-20 MED ORDER — LOSARTAN POTASSIUM 25 MG PO TABS
25 MG | Freq: Every day | ORAL | Status: DC
Start: 2024-01-20 — End: 2024-01-26
  Administered 2024-01-20 – 2024-01-26 (×6): 25 mg via ORAL

## 2024-01-20 MED ORDER — OXYCODONE HCL 5 MG PO TABS
5 MG | Freq: Four times a day (QID) | ORAL | Status: DC | PRN
Start: 2024-01-20 — End: 2024-01-26

## 2024-01-20 MED ORDER — NITROGLYCERIN 0.4 MG SL SUBL
0.4 MG | SUBLINGUAL | Status: DC | PRN
Start: 2024-01-20 — End: 2024-01-26

## 2024-01-20 MED ORDER — METHOCARBAMOL 500 MG PO TABS
500 MG | Freq: Four times a day (QID) | ORAL | Status: DC | PRN
Start: 2024-01-20 — End: 2024-01-26

## 2024-01-20 MED ORDER — HYDRALAZINE HCL 10 MG PO TABS
10 MG | Freq: Four times a day (QID) | ORAL | Status: DC | PRN
Start: 2024-01-20 — End: 2024-01-26

## 2024-01-20 MED ORDER — GLUCOSE 4 G PO CHEW
4 g | ORAL | Status: DC | PRN
Start: 2024-01-20 — End: 2024-01-26

## 2024-01-20 MED ORDER — ASPIRIN 81 MG PO TBEC
81 MG | Freq: Every day | ORAL | Status: DC
Start: 2024-01-20 — End: 2024-01-26
  Administered 2024-01-20 – 2024-01-26 (×7): 81 mg via ORAL

## 2024-01-20 MED ORDER — INSULIN GLARGINE 100 UNIT/ML SC SOLN
100 UNIT/ML | Freq: Every evening | SUBCUTANEOUS | Status: DC
Start: 2024-01-20 — End: 2024-01-26
  Administered 2024-01-20 – 2024-01-26 (×7): 14 [IU] via SUBCUTANEOUS

## 2024-01-20 MED FILL — LAMOTRIGINE 25 MG PO TABS: 25 MG | ORAL | Qty: 1

## 2024-01-20 MED FILL — BAYER LOW DOSE 81 MG PO TBEC: 81 MG | ORAL | Qty: 1

## 2024-01-20 MED FILL — OYSCO 500+D 500-5 MG-MCG PO TABS: 500-5- MG-MCG | ORAL | Qty: 1

## 2024-01-20 MED FILL — BUDESONIDE 0.5 MG/2ML IN SUSP: 0.52 MG/2ML | RESPIRATORY_TRACT | Qty: 2

## 2024-01-20 MED FILL — HUMALOG 100 UNIT/ML IJ SOLN: 100 UNIT/ML | INTRAMUSCULAR | Qty: 2

## 2024-01-20 MED FILL — ACETAMINOPHEN 325 MG PO TABS: 325 MG | ORAL | Qty: 2

## 2024-01-20 MED FILL — LANTUS 100 UNIT/ML SC SOLN: 100 UNIT/ML | SUBCUTANEOUS | Qty: 14

## 2024-01-20 MED FILL — BUSPIRONE HCL 5 MG PO TABS: 5 MG | ORAL | Qty: 3

## 2024-01-20 MED FILL — HUMALOG 100 UNIT/ML IJ SOLN: 100 UNIT/ML | INTRAMUSCULAR | Qty: 3

## 2024-01-20 MED FILL — SERTRALINE HCL 50 MG PO TABS: 50 MG | ORAL | Qty: 1

## 2024-01-20 MED FILL — POLYETHYLENE GLYCOL 3350 17 G PO PACK: 17 g | ORAL | Qty: 1

## 2024-01-20 MED FILL — ATORVASTATIN CALCIUM 40 MG PO TABS: 40 MG | ORAL | Qty: 1

## 2024-01-20 MED FILL — LOSARTAN POTASSIUM 25 MG PO TABS: 25 MG | ORAL | Qty: 1

## 2024-01-20 MED FILL — GABAPENTIN 300 MG PO CAPS: 300 MG | ORAL | Qty: 1

## 2024-01-20 MED FILL — PANTOPRAZOLE SODIUM 40 MG PO TBEC: 40 MG | ORAL | Qty: 1

## 2024-01-20 NOTE — Plan of Care (Signed)
Problem: Occupational Therapy - Adult  Goal: By Discharge: Performs self-care activities at highest level of function for planned discharge setting.  See evaluation for individualized goals.  Description: Occupational Therapy Goals   Long Term Goals  Initiated 01/20/2024 and to be accomplished within 2 week(s) 02/03/2024  1. Pt will perform self-feeding with ModI.  2. Pt will perform grooming with ModI.  3. Pt will perform UB bathing with ModI  4. Pt will perform LB bathing with ModI.  5. Pt will perform tub/shower transfer with ModI.   6. Pt will perform UB dressing with ModI.  7. Pt will perform LB dressing with ModI.  8. Pt will perform toileting task with ModI.  9. Pt will perform toilet transfer with ModI.  10.  Pt will perform an IADL task with good safety and ModI.       Short Term Goals   Initiated 01/20/2024 and to be accomplished within 7 day(s) 01/27/2024  1. Pt will perform self-feeding with S   2. Pt will perform grooming with S.  3. Pt will perform UB bathing with S.  4. Pt will perform LB bathing with S  5. Pt will perform tub/shower transfer with S.  6. Pt will perform UB dressing with S.  7. Pt will perform LB dressing with S  8. Pt will perform toileting task with S.  9. Pt will perform toilet transfer with S.         Outcome: Progressing   OCCUPATIONAL THERAPY EVALUATION    Patient: Colleen [name] 63 y.o.  Date: 01/20/2024  Primary Diagnosis: Spinal stenosis [M48.00]    Patient identified with name and DOB: Yes    Past Medical History:   Past Medical History:   Diagnosis Date    Asthma     Cardiomyopathy (HCC)     Diabetes (HCC)     HTN (hypertension)      Precautions:  Restrictions/Precautions: General Precautions, Fall Risk  Required Braces or Orthoses?: Yes Spinal Precautions: No Bending, No Lifting, No Twisting  Sternal Precautions: No Pushing, No Pulling, 5# Lifting Restrictions                           Barriers to Learning/Limitations: None  Compensate with: visual, verbal, tactile, kinesthetic  cues/model     Time In:    Time Out:      Pain:  Pt reports 1/10 pain or discomfort prior to treatment at upper trapezius muscle.  Pt reports 1/10 pain or discomfort post treatment at upper trapezius muscle.  Intervention Provided: N/A    SUBJECTIVE:   Patient stated "I am tired"."    Patient's Goal for Occupational Therapy: Increase I.    OBJECTIVE DATA SUMMARY:   OTR provided pt with  education for purpose, scope and benefits of OT. Pt agreeable to OT evaluation. Yes     Therapy Diagnosis:   Difficulty with ADLs  [x]      Difficulty with functional transfers  [x]      Difficulty with ambulation  [x]      Difficulty with IADLs  [x]        Problem List:    Decreased strength B UE  [x]      Decreased strength trunk/core  []      Decreased AROM   []      Decreased endurance  [x]      Decreased balance sitting  []      Decreased balance standing  [x]      Pain   [  x]     Decreased PROM  [x]   At right      Functional Limitations:   Decreased independence with ADL  [x]      Decreased independence with functional transfers  [x]      Decreased independence with ambulation  [x]      Decreased independence with IADL  [x]        Previous Functional Level & Home Environment:   Lives With: Alone  Type of Home: Apartment  Home Layout: One level  Home Access: Stairs to enter with rails  Entrance Stairs - Number of Steps: 14 steps to get inside her apartment with no elevator access  Entrance Stairs - Rails: Both  Bathroom Shower/Tub: Electrical engineer: None  Bathroom Accessibility: Accessible  Home Equipment: None  Has the patient had two or more falls in the past year or any fall with injury in the past year?: Yes (secondar to LE giving out due to neurological deficits)  Receives Help From:  (she has 4 children but in [place], has a sister in [place], a sister that lives in [place], and one brother that lives here)  Prior Level of Assist for ADLs: Independent  Prior Level of Assist for [employer]:  Independent  Homemaking Responsibilities: Yes  Meal Prep Responsibility: Print production planner Responsibility: Primary  Cleaning Responsibility: Primary  Bill Paying/Finance Responsibility: Primary  Prior Level of Assist for Ambulation: Independent community ambulator, with or without device, Independent household ambulator, with or without device  Prior Level of Assist for Transfers: Independent  Active Driver: Yes  Mode of Transportation: Set designer  Occupation: Works at home, Nurse, 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, adult education owns a day spa, 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, own Teacher, adult education, working at [employer], and Dance movement psychotherapist           [x]   Right hand dominant   []   Left hand dominant    Outcome Measures:     MMT Initial Assessment   Right Upper Extremity  Left Upper Extremity    Shoulder flexion 3/5 4/5   Shoulder extension 3/5 4/5   Elbow Flexion 3/5 4/5   Elbow Extension 3/5 4/5   Wrist Extension/Flexion 3/5 4/5   Grip 3+/5 4/5   0/5 No palpable muscle contraction  1/5 Palpable muscle contraction, no joint movement  2-/5 Less than full range of motion in gravity eliminated position  2/5 Able to complete full range of motion in gravity eliminated position  2+/5 Able to initiate movement against gravity  3-/5 More than half but not full range of motion against gravity  3/5 Able to complete full range of motion against gravity  3+/5 Completes full range of motion against gravity with minimal resistance  4-/5 Completes full range of motion against gravity with minimal resistance  4/5 Completes full range of motion against gravity with moderate resistance  5/5 Completes full range of motion against gravity with maximum resistance    Skin Integrity: Appears intact    GROSS ASSESSMENT Initial Assessment   AROM  WFL   PROM  WFL   Coordination    Decreased at right         Tone      Sensation    Pt reports tingling numbness on ulnar side at 5th digit (pinky).  Increased swelling noted at right hand      BALANCE Initial Assessment    Sitting Balance Supervision   Sitting Balance Comments    Standing Balance Contact guard  assistance   Standing Balance Comments          COGNITION/PERCEPTION Initial Assessment   Overall orientation status Within Normal Limits   Orientation level Oriented X4   Overall cognitive status WNL                             Cognition comment  WFL   Vision - Basic assessment  WFL   Perception       EATING Initial Assessment   Functional Level Supervision   Clinical factors       GROOMING Initial Assessment   Functional Level Setup   Clinical factors  Pt washed face seated in w/c at sink     UPPER BODY BATHING Initial Assessment   Functional Level Stand by assistance   Method     Clinical Factors Pt washed UB seated in w/c at sink     LOWER BODY BATHING Initial Assessment   Functional Level  Stand by assistance   Clinical Factors Pt washed thighs, lower leg, and feet seted on toilet using wipes.  Pt able to bring leg up to wash feet     SHOWER TRANSFER  Initial Assessment   Transfer Technique     Shower Transfer Functional Level Not tested   Equipment     Shower Transfer Comments       UPPER BODY DRESSING/UNDRESSING Initial Assessment   Functional Level Minimal assistance   Clinical Factors Pt required asst to doff/donn 2/2 to cervical neck brace     LOWER BODY DRESSING/UNDRESSING Initial Assessment   Functional Level Stand by assistance   Clinical Factors Pt thread BLE through underwear and pants seated on toilet     TOILETING Initial Assessment   Functional Level Stand by assistance   Clinical factors       TOILET TRANSFER Initial Assessment   Transfer Technique Stand step (using RW)   Functional Level Stand by assistance   Equipment Standard bedside commode   Toilet Transfer Comments       WHEELCHAIR/BED TRANSFER INDEPENDENCE Initial Assessment   Transfer Technique   Stand step (using RW.)   Level of Assistance Minimal assistance   Equipment Wheelchair   Comments       IADL Initial Assessment (PLOF)   Homemaking  Responsibilities @Yes   Independent      Activity Tolerance:             EDUCATION:   Education Given To: Patient  Education Provided: Role of Therapy;Plan of Care;Home Exercise Program;Safety;ADL Function;Mobility Training;Transfer Training;Energy Conservation;Equipment;Fall Prevention Strategies  Education Method: Demonstration;Verbal  Education Outcome: Verbalized understanding;Demonstrated understanding      ASSESSMENT :  Based on the objective data described above, the patient presents with decreased BUE strength, balance, ROM/sensation at 5th digit at right, increased pain at shoulders, and  edema at right hand impacting I with ADLs.     Pt would benefit from skilled occupational therapy in order to improve independent functional mobility/ADLs,/IADLs within the home. Interventions may include range of motion (AROM, PROM B UE), motor function (B UE/ strengthening/coordination), activity tolerance (vitals, oxygen saturation levels), balance training, ADL/IADL training and functional transfer training.      Please see IRC; Interdisciplinary Eval, Care Plan, and Patient Education for further information regarding occupational therapy examination and plan of care.      PLAN :  Recommendations and Planned Interventions:  [x]   Self Care Training                   [x]         Therapeutic Activities  [x]                Functional Mobility Training    [x]         Cognitive Retraining  [x]                Therapeutic Exercises            [x]         Endurance Activities  [x]                Balance Training                     [x]         Neuromuscular Re-Education  []                Visual/Perceptual Training      [x]    Home Safety Training  [x]                Patient Education                    [x]         Family Training/Education  []                Other (comment):    Frequency/Duration: Patient will be followed by occupational therapy  1-2 times per day/4-7 days per week to address goals.  Discharge Recommendations:    Home Health with with 24 hr asst  Further Equipment Recommendations for Discharge:   bedside commode         COMMUNICATION/EDUCATION:   []  Home safety education was provided and the patient/caregiver indicated understanding.  [x]  Patient/family have participated as able in goal setting and plan of care.  []  Patient/family agree to work toward stated goals and plan of care.  []  Patient understands intent and goals of therapy, but is neutral about his/her participation.  []  Patient is unable to participate in goal setting and plan of care.  []  Care coordinated with nursing regarding patient safety needs and functional performance.    Please refer to the flow sheet for vital signs taken during this treatment.  After treatment:   []  Patient left in no apparent distress sitting up in chair  [x]  Patient left in no apparent distress in bed  []  Patient handoff to SLP/PT  []  Call bell left within reach  []  Nursing notified  []  Caregiver present  []  Bed alarm activated    Order received from MD for occupational therapy services and chart reviewed.  Pt to be seen 5 times per week for 3 hours of total therapy per day for 2  weeks.  Thank you for the referral.    IRF-PAI Completed: Yes  Entered Differentiated Treatment minutes: Yes    Thank you for this referral.  Alinda Deem, OT  01/20/2024

## 2024-01-20 NOTE — Plan of Care (Signed)
Problem: Physical Therapy - Adult  Goal: By Discharge: Performs mobility at highest level of function for planned discharge setting.  See evaluation for individualized goals.  Description: Physical Therapy Short Term Goals  Initiated 01/20/2024 and to be accomplished within 7 day(s)  1.  Patient will supine <> sit  in bed with supervision/set-up to decrease risk of skin break.  2.  Patient will transfer from bed to chair and chair to bed with SBA using the least restrictive device in prep for sitting EOB ADL's.  3.  Patient will perform sit <> stand with SBA with FWW </= 50% verbal cues for task sequencing in prep for gait.     4.  Patient will ambulate with CGA </= 150 feet with the least restrictive device to progress towards independence.  5.  Patient will ascend/descend >/=8 stairs with B handrail(s) with CGA to progress toward home entrance/exit.     Physical Therapy Long Term Goals  Initiated 01/20/2024 and to be accomplished within 2 weeks  1.  Patient will perform all bed mobility task with independence to return to PLOF.    2.  Patient will transfer from bed to chair and chair to bed with independence using the least restrictive device to return to PLOF.   3.  Patient will perform sit <> stand with modified independence in prep for gait.  4.  Patient will ambulate with modified independence >/=  300 feet with the least restrictive device for safe home negotiation.    5.  Patient will ascend/descend 14 stairs with B handrail(s) with supervision/set-up for entrance/exit to home environment.     Outcome: Progressing     PHYSICAL THERAPY EVALUATION    Patient: Colleen [name] (63 y.o. female)  Date: 01/20/2024  Diagnosis: Spinal stenosis [M48.00]   Precautions: General Precautions;Fall Risk No Bending;No Lifting;No Twisting   No Pushing;No Pulling;5# Lifting Restrictions, Aspen Cervical Hard Collar on at all times, posterior drainage at CT junction          Chart, physical therapy assessment, plan of care and goals  were reviewed.    0815  Time In: 0815  Time Out: 0953  Minutes: 56  Entered Differentiated Treatment minutes: Yes    Patient seen for: bed mobility, functional transfers, therapeutic exercise, therapeutic activities, neuromuscular education, w/c mobility, gait training, energy conservation techniques, and patient education.      Pain:  Pt pain was reported as 0/10 pre-treatment.  Pt pain was reported as 2/10 post-treatment.  Intervention: RN present post treatment to administer medication     Patient identified with name and DOB: Yes    SUBJECTIVE:     Patient stated "I'm doing wonderful."    Patient's Goal for Physical Therapy: work on walking and getting home     OBJECTIVE DATA SUMMARY:     Past Medical History:   Diagnosis Date    Asthma     Cardiomyopathy (HCC)     Diabetes (HCC)     HTN (hypertension)      Past Surgical History:   Procedure Laterality Date    BREAST BIOPSY Right     benign more than 30 years ago       Problem List:    Decreased strength B LE  [x]      Decreased strength trunk/core  [x]      Decreased AROM   []      Decreased PROM  []     Decreased endurance  [x]      Decreased balance sitting  [x]   Decreased balance standing  [x]      Pain   [x]      Slow ambulation velocity  [x]     Decreased coordination  [x]     Decreased safety awareness  [x]       Functional Limitations:   Decreased independence with bed mobility  [x]      Decreased independence with functional transfers  [x]      Decreased independence with ambulation  [x]      Decreased independence with stair negotiation  [x]        Previous Functional Level & Home Environment:   Lives With: Alone  Type of Home: Apartment  Home Layout: One level  Home Access: Stairs to enter with rails  Entrance Stairs - Number of Steps: 14 steps to get inside her apartment with no elevator access  Entrance Stairs - Rails: Both  Bathroom Shower/Tub: Electrical engineer: None  Bathroom Accessibility: Accessible  Home  Equipment: None  Has the patient had two or more falls in the past year or any fall with injury in the past year?: Yes (secondar to LE giving out due to neurological deficits)  Receives Help From:  (she has 4 children but in [place], has a sister in [place], a sister that lives in [place], and one brother that lives here)  Prior Level of Assist for ADLs: Independent  Prior Level of Assist for [employer]: Independent  Homemaking Responsibilities: Yes  Meal Prep Responsibility: Print production planner Responsibility: Primary  Cleaning Responsibility: Primary  Bill Paying/Finance Responsibility: Primary  Prior Level of Assist for Ambulation: Independent community ambulator, with or without device, Independent household ambulator, with or without device  Prior Level of Assist for Transfers: Independent  Active Driver: Yes  Mode of Transportation: Set designer  Occupation: Works at home, Nurse, 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, adult education owns a day spa, 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, own Teacher, adult education, working at [employer], and Dance movement psychotherapist       Barriers to Learning/Limitations:    Compensate with: visual, verbal, tactile, kinesthetic cues/model         Outcome Measures: IRF-PAI     MMT Initial Assessment   Right Lower Extremity Left Lower Extremity   Hip Flexion R Hip Flexion: 4-/5 L Hip Flexion: 4+/5   Knee Extension R Knee Extension: 5/5 L Knee Extension: 5/5   Knee Flexion R Knee Flexion: 4/5 L Knee Flexion: 4+/5   Ankle Dorsiflexion       0/5 No palpable muscle contraction  1/5 Palpable muscle contraction, no joint movement  2-/5 Less than full range of motion in gravity eliminated position  2/5 Able to complete full range of motion in gravity eliminated position  2+/5 Able to initiate movement against gravity  3-/5 More than half but not full range of motion against gravity  3/5 Able to complete full range of motion against gravity  3+/5 Completes full range of motion against gravity with minimal resistance  4-/5 Completes full range of  motion against gravity with minimal-moderate resistance  4/5 Completes full range of motion against gravity with moderate resistance  4+/5 Completes full range of motion against gravity with moderate-maximum resistance  5/5 Completes full range of motion against gravity with maximum resistance        GROSS ASSESSMENT Initial Assessment 01/20/2024   AROM  RLE   LLE   [facility]  WFL   Strength  RLE  LLE   Exception  Exception   Coordination  WFL   Tone  RLE  LLE WFL         Sensation WFL (BLE intact to light touch)   PROM  RLE  LLE WFL             POSTURE Initial Assessment 01/20/2024   Posture (WDL) fair    Posture Assessment Patient presents with Aspen collar assisting with upright posture       BALANCE Initial Assessment 01/20/2024   Posture  Sitting - Static  Sitting - Dynamic  Standing - Static  Standing - Dynamic  Comments   Fair  Fair;+  Fair  Fair;-  Poor;+   Patient able to sit EOB without BLE support for donning shirt and pants with SBA    Ability to pick up small object from floor:NT       BED/CHAIR/WHEELCHAIR TRANSFERS Initial Assessment 01/20/2024   Rolling Right Stand by assistance   Rolling Left Stand by assistance   Supine to Sit Contact guard assistance   Sit to Stand Contact guard assistance   Sit to Supine Contact guard assistance   Transfer Assist Score Contact guard assistance   Comments required consistent verbal cues and demo for proper hand placement and task sequencing; patient with noted SOB following car transfer and max effort required to complete transfer     Car Transfer Contact guard assistance   Car Type simulator       WHEELCHAIR MOBILITY/MANAGEMENT Initial Assessment 01/20/2024   Able to Propel 20 feet   Assist Level Stand by assistance   Curbs/ramps assistance required NT    Wheelchair management  patient with increased difficulty locking R brake    Comments        GAIT Initial Assessment 01/20/2024   Quality of Gait decrease heel strike, decrease toe off, narrow BOS, and decrease step length   Gait  Deviations Slow Cadence;Decreased step length;Decreased head and trunk rotation;Shuffles       WALKING INDEPENDENCE Initial Assessment 01/20/2024   Assistive device Rolling Walker   Ambulation assistance - surface Contact guard assistance  Level tile   Distance 65 feet   Comments patient able to increase BOS following verbal cues       STEPS/STAIRS Initial Assessment 01/20/2024   Steps/Stairs ambulated 3  x 3 reps   Step Height 6"   Rail Use Bilateral     Assistance Level Contact guard assistance   Comments NT    Curbs/Ramps  NT       EXERCISE   Sets   Reps   Active Active Assist   Passive Self ROM   Comments   Seated heel/toe raise 3 10  [x]  []   []   []       Seated LAQ 3 10   [x]   []   []   []       Seated alt march 3 10   [x]   []   []   []       Supine ankle pumps 3 10   [x]   []   []   []      Supine SLR flexion 3 10   [x]   []   []   []        Neuromuscular re-education:   Facilitated sitting static balance EOB x 5 mins for donning shirt and pants with SBA     ASSESSMENT :  Based on the objective data described above, the patient presents with a significant decline in functional mobility, impaired mobility, and ADL's following recent hospitalization s/p C3-T2 fusion and C4-6 laminectomy. Patient requires cervical hard collar at all times. Patient  presents with decrease in bed mobility, functional transfers, balance, activity tolerance, and gait mechanics. Patient bed mobility overall SBA with use of bed mechanics and bed rails, supine <> sit CGA, sit <> stand CGA with FWW, and stand pivot transfer CGA with FWW. Patient gait training x 65 feet with FWW with decrease step length, cadence, and narrow BOS. Patient able to negotiate stairs with step to gait pattern, CGA, and B hand rails. Patient provided education regarding rehab process, rehab goals, precautions, use of collar, and safety awareness. Patient challenged throughout session; however, highly motivated to improve her overall function and return to independence in home  environment. Patient is appropriate for skilled therapy services to address the above functional deficits and maximize her functional mobility.     Patient will benefit from skilled intervention to address the above impairments.  Patient's rehabilitation potential is considered to be Therapy Prognosis: Good  Factors which may influence rehabilitation potential include:   []          None noted  []          Mental ability/status  [x]          Medical condition  [x]          Home/family situation and support systems  []          Safety awareness  []          Pain tolerance/management  []          Other:      PLAN :  Please see goals above and POC, thank you!     Order received from MD for physical therapy services and chart reviewed.  Pt to be seen 5 times per week for 3 hours of total therapy per day for 2 weeks.  Thank you for the referral.    Pt would benefit from skilled physical therapy in order to improve independent functional mobility within the home. Interventions may include range of motion (AROM, PROM B LE/trunk), motor function (B LE/trunk strengthening/coordination), activity tolerance (vitals, oxygen saturation levels), bed mobility training, balance activities, gait training (progressive ambulation program), wheelchair management and functional transfer training. Patient would also benefit from concurrent and group therapy sessions to promote increased participation in skilled therapy interventions and to provide opportunities for increased social interaction.     Discharge Recommendations:  Discharge Recommendations: Home with Home health PT;Home with assist PRN  Further Equipment Recommendations for Discharge:        Walker: Rolling          IRF-PAI Completed: done       Activity Tolerance:   Fair  Please refer to the flowsheet for vital signs taken during this treatment.    After treatment:   []  Patient left in no apparent distress in bed  [x]  Patient left in no apparent distress sitting up in  wheelchair  []  Patient left in no apparent distress in w/c mobilizing under own power  []  Patient left in no apparent distress dining area  []  Patient left in no apparent distress mobilizing under own power  [x]  Call bell left within reach  []  Nursing notified  []  Caregiver present  []  Bed alarm activated   [x]  Chair alarm activated.    COMMUNICATION/EDUCATION:   [x]          Fall prevention education was provided and the patient/caregiver indicated understanding.  [x]          Patient/family have participated as able in goal setting and plan of care.  [x]   Patient/family agree to work toward stated goals and plan of care.  [x]          Patient understands intent and goals of therapy, but is neutral about his/her participation.  []          Patient is unable to participate in goal setting and plan of care.      Thank you for this referral.  Eustace Pen, PT  01/20/2024

## 2024-01-20 NOTE — Plan of Care (Signed)
Problem: Chronic Conditions and Co-morbidities  Goal: Patient's chronic conditions and co-morbidity symptoms are monitored and maintained or improved  Outcome: Progressing  Flowsheets  Taken 01/19/2024 2200  Care Plan - Patient's Chronic Conditions and Co-Morbidity Symptoms are Monitored and Maintained or Improved:   Monitor and assess patient's chronic conditions and comorbid symptoms for stability, deterioration, or improvement   Collaborate with multidisciplinary team to address chronic and comorbid conditions and prevent exacerbation or deterioration   Update acute care plan with appropriate goals if chronic or comorbid symptoms are exacerbated and prevent overall improvement and discharge  Taken 01/19/2024 1930  Care Plan - Patient's Chronic Conditions and Co-Morbidity Symptoms are Monitored and Maintained or Improved:   Monitor and assess patient's chronic conditions and comorbid symptoms for stability, deterioration, or improvement   Collaborate with multidisciplinary team to address chronic and comorbid conditions and prevent exacerbation or deterioration   Update acute care plan with appropriate goals if chronic or comorbid symptoms are exacerbated and prevent overall improvement and discharge     Problem: Pain  Goal: Verbalizes/displays adequate comfort level or baseline comfort level  Outcome: Progressing     Problem: Neurosensory - Adult  Goal: Achieves maximal functionality and self care  Outcome: Progressing  Flowsheets (Taken 01/20/2024 0543)  Achieves maximal functionality and self care: Encourage visually impaired, hearing impaired and aphasic patients to use assistive/communication devices     Problem: Skin/Tissue Integrity - Adult  Goal: Skin integrity remains intact  Outcome: Progressing  Flowsheets (Taken 01/20/2024 0543)  Skin Integrity Remains Intact: Monitor for areas of redness and/or skin breakdown     Problem: Skin/Tissue Integrity - Adult  Goal: Incisions, wounds, or drain sites healing  without S/S of infection  Outcome: Progressing  Flowsheets (Taken 01/20/2024 0543)  Incisions, Wounds, or Drain Sites Healing Without Sign and Symptoms of Infection: TWICE DAILY: Assess and document dressing/incision, wound bed, drain sites and surrounding tissue     Problem: Musculoskeletal - Adult  Goal: Return mobility to safest level of function  Outcome: Progressing  Flowsheets (Taken 01/20/2024 0543)  Return Mobility to Safest Level of Function:   Assess patient stability and activity tolerance for standing, transferring and ambulating with or without assistive devices   Instruct patient/family in ordered activity level   Assist with transfers and ambulation using safe patient handling equipment as needed     Problem: Musculoskeletal - Adult  Goal: Maintain proper alignment of affected body part  Outcome: Progressing  Flowsheets (Taken 01/20/2024 0543)  Maintain proper alignment of affected body part:   Support and protect limb and body alignment per provider's orders   Instruct and reinforce with patient and family use of appropriate assistive device and precautions (e.g. spinal or hip dislocation precautions)     Problem: Musculoskeletal - Adult  Goal: Return ADL status to a safe level of function  Outcome: Progressing  Flowsheets (Taken 01/20/2024 0543)  Return ADL Status to a Safe Level of Function:   Administer medication as ordered   Assess activities of daily living deficits and provide assistive devices as needed   Assist and instruct patient to increase activity and self care as tolerated

## 2024-01-20 NOTE — Progress Notes (Signed)
Colleen [name] is a 63 y.o.  female admitted on 01/19/2024 from [facility]. Report received from [place], [place]. Transportation was provided by ambulance, and the patient was transferred to her room via Wheelchair. Patient was assisted to bed with assist of 1. The patient was oriented to her room. Fall risk precautions were discussed with the patient; she was instructed to call for help prior to getting up. The following fall risk precautions were initiated: bed/ chair alarms, door signage, yellow bracelet and socks. Four eyes nurse skin assessment was performed by Laverle Hobby and Myrtie Cruise. Skin problems noted: Yes, surgical incision midline on lower neck/upper back.    Laverle Hobby, RN

## 2024-01-20 NOTE — Progress Notes (Signed)
4 Eyes Skin Assessment     NAME:  Colleen [name]  DATE OF BIRTH:  1961-06-28  MEDICAL RECORD NUMBER:  [id number]    The patient is being assessed for  Shift Handoff    I agree that at least one RN has performed a thorough Head to Toe Skin Assessment on the patient. ALL assessment sites listed below have been assessed.      Areas assessed by both nurses:    Shoulders, Back, Chest, Arms, Elbows, Hands, Sacrum. Buttock, Coccyx, Ischium, and Legs. Feet and Heels        Does the Patient have a Wound? Yes wound(s) were present on assessment. LDA wound assessment was Initiated and completed by RN       Braden Prevention initiated by RN: No  Wound Care Orders initiated by RN: Yes    Pressure Injury (Stage 3,4, Unstageable, DTI, NWPT, and Complex wounds) if present, place Wound referral order by RN under ORDER ENTRY: No    New Ostomies, if present place, Ostomy referral order under ORDER ENTRY: No     Nurse 1 eSignature: Electronically signed by Orland Dec, RN on 01/20/24 at 7:13 PM EST    **SHARE this note so that the co-signing nurse can place an eSignature**    Nurse 2 eSignature: Electronically signed by Thom Chimes, RN on 01/20/24 at 7:16 PM EST

## 2024-01-20 NOTE — H&P (Signed)
[facility]  [address], [place], [place] [address]     INPATIENT REHABILITATION  HISTORY AND PHYSICAL      Name: Talynn Lebon CSN: [id number]   Age: 63 y.o.  MRN: [id number]   Sex: female Admit Date: 01/19/2024     PCP: Kerri Perches, PA         Subjective:     Patient seen and examined.    History of the Present Illness:   This is a 63 year old female with a history of hypertension and diabetes and dyslipidemia who was recently admitted to [facility].  Patient was noted to have cervical spinal stenosis and radiculopathy.  Patient underwent a C3-T2 cervical fusion and C4-6 laminectomy and C3 and C7 dome laminectomy.  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  Postoperatively patient was monitored.  Patient was started on a therapy regimen.  It was felt that patient may benefit from transitioning to the inpatient rehab facility.  For full details regarding hospital course please refer to chart    The patient  was noted to have impaired mobility and ADLs. Patient was felt to be a good candidate for acute inpatient rehabilitation. Upon evaluation by Physical Therapy and Occupational Therapy, the patient was recommended for acute inpatient rehabilitation. The patient was discharged and was subsequently admitted to the [facility] for intensive rehabilitation to help recover strength, function and mobility in the setting of cervical spinal stenosis and radiculopathy, status post C3-T2 cervical fusion and C4-6 laminectomies and C3 and C7 dome laminectomy.    I personally saw and evaluated this patient face-to-face today in the inpatient rehab facility at [facility].  Patient is laying in bed in no apparent distress.  Patient is wearing hard cervical collar.  No history of any chest pain or shortness of breath.  No history of any fever or chills.   Pain is controlled.  No history of any nausea vomiting or abdominal pain.  No history of any headaches or visual changes.  Patient is in good spirits.  Patient denies any suicidal ideation        Past Medical History:      Diagnosis Date    Asthma     Cardiomyopathy (HCC)     Diabetes (HCC)     HTN (hypertension)        Past Surgical History:      Procedure Laterality Date    BREAST BIOPSY Right     benign more than 30 years ago       Allergies:  Amoxicillin      Social History:   Patient denies any tobacco use or alcohol use      Family History:  Patient did not provide any details regarding her family's medical history      Transfer Medications (from the Discharge Summary   Tylenol as needed  Sliding scale insulin  Albuterol as needed  Lipitor 40 mg daily  BuSpar 15 mg daily  Calcium plus vitamin D twice daily  Colace as needed  Neurontin 300 mg at bedtime  Lantus 14 units at bedtime  MiraLAX daily  Zoloft 50 mg daily  Protonix 40 mg daily  Lamotrigine 25 mg twice daily  Flovent inhaler twice daily  Oxycodone 5 mg every 4 hours as needed  Aspirin 81 mg p.o. daily  Losartan 50 mg p.o. daily  Metformin  Robaxin 500 every 6 hours  as needed      Review Of Systems:   Review of Systems  GENERAL: Patient alert, awake and oriented times 3, able to communicate full sentences and not in distress.   HEENT: No change in vision, no earache, tinnitus, sore throat or sinus congestion.   NECK: No pain or stiffness.   PULMONARY: No shortness of breath, cough or wheeze.   Cardiovascular: no pnd or orthopnea, no CP  GASTROINTESTINAL: No abdominal pain, nausea, vomiting or diarrhea, melena or bright red blood per rectum.   GENITOURINARY: No urinary frequency, urgency, hesitancy or dysuria.   MUSCULOSKELETAL: No back pain, no leg pain  DERMATOLOGIC: No rash, no itching, no lesions.   ENDOCRINE: No polyuria, polydipsia, no heat or cold intolerance. No recent change in weight.   HEMATOLOGICAL: No anemia or easy bruising or bleeding.    NEUROLOGIC: No headache, seizures, numbness, tingling .  Patient has some mild residual weakness in the right upper extremity          Objective:     Vital Signs:  Patient Vitals for the past 24 hrs:   BP Temp Temp src Pulse Resp SpO2   01/19/24 1930 (!) 123/58 98.6 F (37 C) Oral 64 17 97 %        There is no height or weight on file to calculate BMI.    Physical Examination:   General:    Lying in bed in no acute distress.  HEENT:  Pupils equal.  Sclera anicteric.  Conjunctiva pink.  Mucous membranes                           Moist, no ear or nasal discharge  Neck:  Supple.  Trachea midline.  No accessory muscle use.  No thyromegaly.                           No jugular venous distention, no carotid bruit  CV:                  Regular rate and rhythm. S1S2+  Lungs:   Clear to auscultation bilaterally.  No Wheezing or Rhonchi. No rales.  Abdomen:   Soft, non-tender. Not distended.  Bowel sounds normal.   Extremities: No cyanosis.  No edema. Pulses 1+ b/l  Neurologic: Alert and oriented X 3.  Follows commands, responds appropriately.  Patient has some right upper extremity weakness.  Right grip strength is decreased  Skin:                Warm and dry.  No rashes.   Patient is wearing hard cervical collar        Current Medications:  Current Facility-Administered Medications   Medication Dose Route Frequency    albuterol (PROVENTIL) (2.5 MG/3ML) 0.083% nebulizer solution 2.5 mg  2.5 mg Nebulization Q4H PRN    aspirin EC tablet 81 mg  81 mg Oral Daily    atorvastatin (LIPITOR) tablet 40 mg  40 mg Oral Daily    busPIRone (BUSPAR) tablet 15 mg  15 mg Oral Daily    budesonide (PULMICORT) nebulizer suspension 1,000 mcg  1 mg Nebulization BID RT    gabapentin (NEURONTIN) capsule 300 mg  300 mg Oral Nightly    losartan (COZAAR) tablet 25 mg  25 mg Oral Daily    hydrALAZINE (APRESOLINE) tablet 10 mg  10 mg Oral Q6H PRN    nitroGLYCERIN (NITROSTAT) SL tablet  0.4 mg  0.4 mg SubLINGual Q5 Min PRN    sertraline (ZOLOFT)  tablet 50 mg  50 mg Oral Daily    oyster shell calcium w/D 500-5 MG-MCG tablet 1 tablet  1 tablet Oral BID    insulin glargine (LANTUS) injection vial 14 Units  14 Units SubCUTAneous Nightly    polyethylene glycol (GLYCOLAX) packet 17 g  17 g Oral Daily    pantoprazole (PROTONIX) tablet 40 mg  40 mg Oral QAM AC    lamoTRIgine (LAMICTAL) tablet 25 mg  25 mg Oral BID    oxyCODONE (ROXICODONE) immediate release tablet 5 mg  5 mg Oral Q6H PRN    naloxone (NARCAN) injection 0.4 mg  0.4 mg IntraVENous PRN    methocarbamol (ROBAXIN) tablet 500 mg  500 mg Oral Q6H PRN    glucose chewable tablet 16 g  4 tablet Oral PRN    dextrose bolus 10% 125 mL  125 mL IntraVENous PRN    Or    dextrose bolus 10% 250 mL  250 mL IntraVENous PRN    glucagon emergency SOLR 1 mg  1 mg SubCUTAneous PRN    dextrose 10 % infusion   IntraVENous Continuous PRN    acetaminophen (TYLENOL) tablet 650 mg  650 mg Oral Q4H PRN    bisacodyl (DULCOLAX) EC tablet 5 mg  5 mg Oral Daily PRN    insulin lispro (HUMALOG,ADMELOG) injection vial 0-4 Units  0-4 Units SubCUTAneous 4x Daily AC & HS       Functional Assessment:     Occupational Therapy   Prior Level of Function  Pre-Admission Screen  Post-Admission Evaluation   Eating   Independent Eating   Minimum assistance Eating      Grooming   Independent Grooming   Minimum assistance Grooming      Upper Body Dressing   Independent Upper Body Dressing   {Minimum assistance Upper Body Dressing      Lower Body Dressing   Independent Lower Body Dressing   {Minimum assistance Lower Body Dressing      Bladder Management   Independent Bladder Management   Modified independence Toileting      Bowel Management   Independent Bowel Management   Modified independence      Physical Therapy   Prior Level of Function  Pre-Admission Screen  Post-Admission Evaluation   Ambulation   Independent Ambulation   Minimal assistance        Bed Mobility   Independent Bed Mobility   Supervision        Supine to Sit   Independent Supine to  Sit   Supervision     Sit to Stand   Independent Sit to Stand   Supervision     Bed/Chair Transfers   Independent Bed/Chair Transfers   Minimum assistance     Toilet Transfers   Independent Toilet Transfers   Minimum assistance Toilet Transfers        Speech and Language Pathology  Post-Admission Evaluation                    Legend:   7 - Independent   6 - Modified Independent   5 - Standby Assistance / Supervision / Set-up   4 - Minimum Assistance / Contact Guard Assistance   3 - Moderate Assistance   2 - Maximum Assistance   1 - Total Assistance / Dependent       Labs on Admission:  Recent Results (from the past 24 hour(s))  POCT Glucose    Collection Time: 01/19/24 10:41 PM   Result Value Ref Range    POC Glucose 300 (H) 70 - 110 mg/dL   Basic Metabolic Panel    Collection Time: 01/20/24  5:57 AM   Result Value Ref Range    Sodium 142 136 - 145 mmol/L    Potassium 3.8 3.5 - 5.5 mmol/L    Chloride 111 100 - 111 mmol/L    CO2 27 21 - 32 mmol/L    Anion Gap 4 3.0 - 18 mmol/L    Glucose 150 (H) 74 - 99 mg/dL    BUN 21 (H) 7.0 - 18 MG/DL    Creatinine 1.61 0.6 - 1.3 MG/DL    BUN/Creatinine Ratio 34 (H) 12 - 20      Est, Glom Filt Rate >90 >60 ml/min/1.68m2    Calcium 8.9 8.5 - 10.1 MG/DL   CBC with Auto Differential    Collection Time: 01/20/24  5:57 AM   Result Value Ref Range    WBC 13.5 (H) 4.6 - 13.2 K/uL    RBC 3.80 (L) 4.20 - 5.30 M/uL    Hemoglobin 10.4 (L) 12.0 - 16.0 g/dL    Hematocrit 09.6 (L) 35.0 - 45.0 %    MCV 86.3 78.0 - 100.0 FL    MCH 27.4 24.0 - 34.0 PG    MCHC 31.7 31.0 - 37.0 g/dL    RDW 04.5 40.9 - 81.1 %    Platelets 227 135 - 420 K/uL    MPV 10.8 9.2 - 11.8 FL    Nucleated RBCs 0.0 0 PER 100 WBC    nRBC 0.00 0.00 - 0.01 K/uL    Neutrophils % 62.5 40.0 - 73.0 %    Lymphocytes % 27.6 21.0 - 52.0 %    Monocytes % 7.2 3.0 - 10.0 %    Eosinophils % 2.0 0.0 - 5.0 %    Basophils % 0.1 0.0 - 2.0 %    Immature Granulocytes % 0.6 (H) 0.0 - 0.5 %    Neutrophils Absolute 8.45 (H) 1.80 - 8.00 K/UL     Lymphocytes Absolute 3.73 (H) 0.90 - 3.60 K/UL    Monocytes Absolute 0.97 0.05 - 1.20 K/UL    Eosinophils Absolute 0.27 0.00 - 0.40 K/UL    Basophils Absolute 0.01 0.00 - 0.10 K/UL    Immature Granulocytes Absolute 0.08 (H) 0.00 - 0.04 K/UL    Differential Type AUTOMATED     Magnesium    Collection Time: 01/20/24  5:57 AM   Result Value Ref Range    Magnesium 2.1 1.6 - 2.6 mg/dL           Assessment:     Primary Rehabilitation Diagnosis   Impaired mobility and ADLs in the setting of cervical spinal stenosis and radiculopathy, status post C3-T2 fusion and C4-6 laminectomy and C3 and C7 dome laminectomy    Other Co-Morbid Conditions managed in Rehab     Type 2 diabetes  Hypertension  Dyslipidemia  Anxiety and depression  GERD  Leukocytosis      Willingness to participate in the program: Good      Rehabilitation Potential: Good      Plan:     1. Medical Issues being followed closely:    [x]   Fall and safety precautions     [x]   Wound Care     [x]   Bowel and Bladder Function     [x]   Fluid Electrolyte and Nutrition Balance     [  x]  Pain Control      2. Issues that 24 hour rehabilitation nursing is following:    [x]   Fall and safety precautions     [x]   Wound Care     [x]   Bowel and Bladder Function     [x]   Fluid Electrolyte and Nutrition Balance     [x]   Pain Control      [x]   Assistance with and education on in-room safety with transfers to and from the bed, wheelchair, toilet and shower.      3. Acute rehabilitation plan of care:    [x]   Patient to be evaluated and treated by:           [x]   Physical Therapy           [x]   Occupational Therapy           []   Speech Therapy     []   Hold Rehab until further notice     5. Medications:    [x]   MAR Reviewed     [x]   Continue Present Medications     6. Chemical DVT Prophylaxis:      []   Enoxaparin     []   Unfractionated Heparin     []   Warfarin     []   NOAC     []   Aspirin     [x]   None     7. Mechanical DVT Prophylaxis:      [x]   TED Stockings     [x]   Sequential  Compression Device     []   None     8. GI Prophylaxis:      [x]   PPI     []   H2 Blocker     []   None / Not indicated     9. Code status:    [x]   Full code     []   Partial code     []   Do not intubate     []   Do not resuscitate         10. Rehabilitation program and expectations from patient, as well as medical issues discussed with the patient.  - The patient is being admitted to a comprehensive acute inpatient rehabilitation program  consisting of at least 180 minutes a day, 5 out of 7 days a week of  combined physical, and occupational therapy, and close supervision by a physician with special training and experience in rehabilitation medicine.    - The patient's prognosis for significant practical improvement within a reasonable period of time appears to be good.   - The estimated length of stay is 14 days.  - The patient/family has a good understanding of our discharge process. The patient has potential to make improvement and is in need of at least two of the following multidisciplinary therapies including but not limited to physical, occupational, speech, nutritional services, wound care, prosthetics and orthotics. The patient is expected to be able to return to home with home health therapy and family support.  - Given the patient's multiple co-morbidities and risk for further medical complications, rehabilitation services could not be safely provided at a lower level of care such as a skilled nursing facility.    - Physical therapy for therapeutic exercise, progressive mobility, gait training, transfer training, bed mobility training, patient and family education, and wheelchair mobility training. Physical therapy goals to address - extremity function, range of motion, balance, safety awareness, independence in transfers, activity tolerance, independence in bed mobility, and  independence in ambulation.  - Occupational therapy for self-care home management, transfer training, therapeutic exercise, activity,  wheelchair mobility training. Occupational therapy goals to address - extremity function, cognition, balance, activity tolerance, independence in functional transfers, range of motion, safety awareness, independence in ADL  and independence in home management skills.  - Specialized 24 hour rehabilitation nursing care for bowel and bladder retraining, disease management, pain management, pressure ulcer prevention and management per policy, education on pressure relief techniques, embolism prevention, nutrition management, hydration management, transfer training and   medication distribution.    - Nutrition and Dietary services will be obtained for assessment of adequate calorie needs, hydration and calorie counts as appropriate.  - Social work services for patient and family counseling and safe discharge planning.       MEDICAL PLAN:     Impaired mobility and ADLs in the setting of cervical spinal stenosis and radiculopathy, status post C3-T2 fusion and C4-6 laminectomy and C3 and C7 dome laminectomy  Physical therapy and Occupational Therapy  Keep hard cervical collar on at all times  Judicious pain control  Monitor wound    Type 2 diabetes  Continue Lantus 14 units at bedtime  Monitor sugars ACHS  Sliding scale insulin    Hypertension  Continue losartan 50 mg daily  As needed hydralazine  Monitor blood pressure    Dyslipidemia  Continue atorvastatin 40 mg daily    Anxiety and depression  Continue BuSpar 15 mg daily  Continue Zoloft 50 mg daily    GERD  Continue Protonix    Leukocytosis which appears to be improving.  Patient is afebrile and nontoxic.  No history of any cough fever or chills.  Patient denies any nausea or abdominal pain or diarrhea  Monitor intermittently    I discussed care plan with the patient and she verbally understanding and agreed.  Patient wishes to be full code    There is no height or weight on file to calculate BMI.     The domains of functional impairment present in this patient which  will require an intensive and interdisciplinary rehabilitation environment include self care, mobility, motor dysfunction, bowel/bladder management, and pain management.    Estimated length of stay for this admission 14days    Anticipated disposition: Home.  The potential to achieve that is good.        PRECAUTIONS:   1. Safety/fall precautions.   2. Deep venous thrombosis precautions.   3. Aspiration precautions.       POTENTIAL BARRIERS TO DISCHARGE:   1. Risk for falls.  2. Current home layout.  3. Family limited in ability to provide constant care.  4. Limited community resources.      RELEVANT CHANGES SINCE PREADMISSION SCREENING: I have compared the patient's medical and functional status at the time of the pre-admission screening and on this post-admission evaluation. The preadmission screen and findings from therapy evaluations both support my post admission physician evaluation, deeming this patient to be an appropriate candidate for the IRF.The patient requires multidisciplinary treatment, physician oversight and intensive therapy not provided at a lower level of care.     By signing this document, I acknowledge that I have personally performed a full physical examination on this patient within 24 hours of admission to this inpatient rehabilitation facility and have determined the patient to be able to tolerate the above course of treatment at an intensive level for a reasonable period of time. I will be completing a detailed individualized plan of care for  this patient by day #4 of the patient's stay based upon the Pre-Admission Screen, the History and Physical Evaluation, and the therapy evaluations.        Total clinical care time is 75 minutes, including review of chart including all labs, radiology, past medical history, and discussion with patient. Greater than 50% of my time was spent in coordination of care and counseling.    Dragon medical dictation software was used for portions of this report.  Unintended errors may occur.      Signed:    Jonah Blue, MD    January 20, 2024

## 2024-01-20 NOTE — Progress Notes (Signed)
4 Eyes Skin Assessment     NAME:  Colleen [name]  DATE OF BIRTH:  [birth year]  MEDICAL RECORD NUMBER:  [id number]    The patient is being assessed for  Shift Handoff    I agree that at least one RN has performed a thorough Head to Toe Skin Assessment on the patient. ALL assessment sites listed below have been assessed.      Areas assessed by both nurses:    Shoulders, Back, Chest, Arms, Elbows, Hands, Sacrum. Buttock, Coccyx, Ischium, Legs. Feet and Heels, and Under Medical Devices         Does the Patient have a Wound? Yes wound(s) were present on assessment. LDA wound assessment was Initiated and completed by RN       Braden Prevention initiated by RN: Yes  Wound Care Orders initiated by RN: No    Pressure Injury (Stage 3,4, Unstageable, DTI, NWPT, and Complex wounds) if present, place Wound referral order by RN under ORDER ENTRY: No    New Ostomies, if present place, Ostomy referral order under ORDER ENTRY: No     Nurse 1 eSignature: Electronically signed by Laverle Hobby, RN on 01/20/24 at 8:55 AM EST    **SHARE this note so that the co-signing nurse can place an eSignature**    Nurse 2 eSignature: Electronically signed by Orland Dec, RN on 01/20/24 at 10:18 AM EST

## 2024-01-20 NOTE — Plan of Care (Signed)
Problem: Chronic Conditions and Co-morbidities  Goal: Patient's chronic conditions and co-morbidity symptoms are monitored and maintained or improved  01/20/2024 1033 by Orland Dec, RN  Outcome: Progressing  Flowsheets (Taken 01/20/2024 0906)  Care Plan - Patient's Chronic Conditions and Co-Morbidity Symptoms are Monitored and Maintained or Improved: Monitor and assess patient's chronic conditions and comorbid symptoms for stability, deterioration, or improvement    Goal: Verbalizes/displays adequate comfort level or baseline comfort level  01/20/2024 1033 by Orland Dec, RN  Outcome: Progressing  Flowsheets (Taken 01/20/2024 1033)  Verbalizes/displays adequate comfort level or baseline comfort level:   Encourage patient to monitor pain and request assistance   Assess pain using appropriate pain scale   Administer analgesics based on type and severity of pain and evaluate response   Implement non-pharmacological measures as appropriate and evaluate response   Consider cultural and social influences on pain and pain management   Notify Licensed Independent Practitioner if interventions unsuccessful or patient reports new pain     Problem: Neurosensory - Adult  Goal: Achieves maximal functionality and self care  01/20/2024 1033 by Orland Dec, RN  Outcome: Progressing  Flowsheets (Taken 01/20/2024 0906)  Achieves maximal functionality and self care: Encourage and assist patient to increase activity and self care with guidance from physical therapy/occupational therapy       Problem: Skin/Tissue Integrity - Adult  Goal: Skin integrity remains intact  01/20/2024 1033 by Orland Dec, RN  Outcome: Progressing  Flowsheets (Taken 01/20/2024 0906)  Skin Integrity Remains Intact: Monitor for areas of redness and/or skin breakdown    Goal: Incisions, wounds, or drain sites healing without S/S of infection  01/20/2024 1033 by Orland Dec, RN  Outcome: Progressing  Flowsheets (Taken 01/20/2024 0906)  Incisions, Wounds, or Drain Sites  Healing Without Sign and Symptoms of Infection:   ADMISSION and DAILY: Assess and document risk factors for pressure ulcer development   TWICE DAILY: Assess and document skin integrity   TWICE DAILY: Assess and document dressing/incision, wound bed, drain sites and surrounding tissue   Implement wound care per orders       Problem: Musculoskeletal - Adult  Goal: Return mobility to safest level of function  01/20/2024 1033 by Orland Dec, RN  Outcome: Progressing  Flowsheets (Taken 01/20/2024 0906)  Return Mobility to Safest Level of Function: Instruct patient/family in ordered activity level    Goal: Maintain proper alignment of affected body part  01/20/2024 1033 by Orland Dec, RN  Outcome: Progressing  Flowsheets (Taken 01/20/2024 0906)  Maintain proper alignment of affected body part: Instruct and reinforce with patient and family use of appropriate assistive device and precautions (e.g. spinal or hip dislocation precautions)    Goal: Return ADL status to a safe level of function  01/20/2024 1033 by Orland Dec, RN  Outcome: Progressing  Flowsheets (Taken 01/20/2024 0906)  Return ADL Status to a Safe Level of Function: Assist and instruct patient to increase activity and self care as tolerated       Problem: Safety - Adult  Goal: Free from fall injury  Outcome: Progressing  Flowsheets (Taken 01/20/2024 1033)  Free From Fall Injury:   Instruct family/caregiver on patient safety   Based on caregiver fall risk screen, instruct family/caregiver to ask for assistance with transferring infant if caregiver noted to have fall risk factors

## 2024-01-20 NOTE — Progress Notes (Signed)
Patient has bone stimulator equipment at bedside. States she was instructed at [facility] to use daily for 30 min. Spoke with Dr. Garret Reddish, orders received for nursing to assist patient with bone stimulator.

## 2024-01-21 LAB — POCT GLUCOSE
POC Glucose: 253 mg/dL — ABNORMAL HIGH (ref 70–110)
POC Glucose: 151 mg/dL — ABNORMAL HIGH (ref 70–110)
POC Glucose: 176 mg/dL — ABNORMAL HIGH (ref 70–110)
POC Glucose: 173 mg/dL — ABNORMAL HIGH (ref 70–110)

## 2024-01-21 MED FILL — OYSCO 500+D 500-5 MG-MCG PO TABS: 500-5- MG-MCG | ORAL | Qty: 1

## 2024-01-21 MED FILL — ACETAMINOPHEN 325 MG PO TABS: 325 MG | ORAL | Qty: 2

## 2024-01-21 MED FILL — BAYER LOW DOSE 81 MG PO TBEC: 81 MG | ORAL | Qty: 1

## 2024-01-21 MED FILL — LANTUS 100 UNIT/ML SC SOLN: 100 UNIT/ML | SUBCUTANEOUS | Qty: 14

## 2024-01-21 MED FILL — BUSPIRONE HCL 5 MG PO TABS: 5 MG | ORAL | Qty: 3

## 2024-01-21 MED FILL — POLYETHYLENE GLYCOL 3350 17 G PO PACK: 17 g | ORAL | Qty: 1

## 2024-01-21 MED FILL — LOSARTAN POTASSIUM 25 MG PO TABS: 25 MG | ORAL | Qty: 1

## 2024-01-21 MED FILL — HUMALOG 100 UNIT/ML IJ SOLN: 100 UNIT/ML | INTRAMUSCULAR | Qty: 2

## 2024-01-21 MED FILL — BUDESONIDE 0.5 MG/2ML IN SUSP: 0.52 MG/2ML | RESPIRATORY_TRACT | Qty: 2

## 2024-01-21 MED FILL — LAMOTRIGINE 25 MG PO TABS: 25 MG | ORAL | Qty: 1

## 2024-01-21 MED FILL — PANTOPRAZOLE SODIUM 40 MG PO TBEC: 40 MG | ORAL | Qty: 1

## 2024-01-21 MED FILL — SERTRALINE HCL 50 MG PO TABS: 50 MG | ORAL | Qty: 1

## 2024-01-21 MED FILL — ATORVASTATIN CALCIUM 40 MG PO TABS: 40 MG | ORAL | Qty: 1

## 2024-01-21 MED FILL — GABAPENTIN 300 MG PO CAPS: 300 MG | ORAL | Qty: 1

## 2024-01-21 NOTE — Progress Notes (Signed)
4 Eyes Skin Assessment     NAME:  Colleen [name]  DATE OF BIRTH:  October 14, 1961  MEDICAL RECORD NUMBER:  [id number]    The patient is being assessed for  Shift Handoff    I agree that at least one RN has performed a thorough Head to Toe Skin Assessment on the patient. ALL assessment sites listed below have been assessed.      Areas assessed by both nurses:    Arms, Elbows, Hands, Sacrum. Buttock, Coccyx, Ischium, and Legs. Feet and Heels        Does the Patient have a Wound? Yes wound(s) were present on assessment. LDA wound assessment was Initiated and completed by RN       Braden Prevention initiated by RN: No  Wound Care Orders initiated by RN: Yes    Pressure Injury (Stage 3,4, Unstageable, DTI, NWPT, and Complex wounds) if present, place Wound referral order by RN under ORDER ENTRY: No    New Ostomies, if present place, Ostomy referral order under ORDER ENTRY: No     Nurse 1 eSignature: Electronically signed by Orland Dec, RN on 01/21/24 at 7:13 PM EST    **SHARE this note so that the co-signing nurse can place an eSignature**    Nurse 2 eSignature: Electronically signed by Thom Chimes, RN on 01/22/24 at 6:27 AM EST

## 2024-01-21 NOTE — Plan of Care (Signed)
Problem: Chronic Conditions and Co-morbidities  Goal: Patient's chronic conditions and co-morbidity symptoms are monitored and maintained or improved  01/21/2024 1037 by Orland Dec, RN  Outcome: Progressing  Flowsheets (Taken 01/21/2024 0809)  Care Plan - Patient's Chronic Conditions and Co-Morbidity Symptoms are Monitored and Maintained or Improved: Monitor and assess patient's chronic conditions and comorbid symptoms for stability, deterioration, or improvement       Problem: Pain  Goal: Verbalizes/displays adequate comfort level or baseline comfort level  01/21/2024 1037 by Orland Dec, RN  Outcome: Progressing  Verbalizes/displays adequate comfort level or baseline comfort level: Encourage patient to monitor pain and request assistance       Problem: Neurosensory - Adult  Goal: Achieves maximal functionality and self care  01/21/2024 1037 by Orland Dec, RN  Outcome: Progressing  Flowsheets (Taken 01/21/2024 0809)  Achieves maximal functionality and self care: Monitor swallowing and airway patency with patient fatigue and changes in neurological status       Problem: Skin/Tissue Integrity - Adult  Goal: Skin integrity remains intact  01/21/2024 1037 by Orland Dec, RN  Outcome: Progressing  Flowsheets (Taken 01/21/2024 0809)  Skin Integrity Remains Intact: Monitor for areas of redness and/or skin breakdown    Goal: Incisions, wounds, or drain sites healing without S/S of infection  Outcome: Progressing  Flowsheets  Taken 01/21/2024 0809 by Orland Dec, RN  Incisions, Wounds, or Drain Sites Healing Without Sign and Symptoms of Infection:   ADMISSION and DAILY: Assess and document risk factors for pressure ulcer development   TWICE DAILY: Assess and document skin integrity   TWICE DAILY: Assess and document dressing/incision, wound bed, drain sites and surrounding tissue   Implement wound care per orders       Problem: Musculoskeletal - Adult  Goal: Return mobility to safest level of function  01/21/2024 1037 by Orland Dec, RN  Outcome: Progressing  Flowsheets (Taken 01/21/2024 0809)  Return Mobility to Safest Level of Function: Instruct patient/family in ordered activity level    Goal: Maintain proper alignment of affected body part  Outcome: Progressing  Flowsheets (Taken 01/21/2024 0809)  Maintain proper alignment of affected body part: Instruct and reinforce with patient and family use of appropriate assistive device and precautions (e.g. spinal or hip dislocation precautions)  Goal: Return ADL status to a safe level of function  01/21/2024 1037 by Orland Dec, RN  Outcome: Progressing  Flowsheets (Taken 01/21/2024 0809)  Return ADL Status to a Safe Level of Function: Assist and instruct patient to increase activity and self care as tolerated       Problem: Safety - Adult  Goal: Free from fall injury  01/21/2024 1037 by Orland Dec, RN  Outcome: Progressing  Flowsheets (Taken 01/20/2024 1033)  Free From Fall Injury:   Instruct family/caregiver on patient safety   Based on caregiver fall risk screen, instruct family/caregiver to ask for assistance with transferring infant if caregiver noted to have fall risk factors

## 2024-01-21 NOTE — Progress Notes (Signed)
4 Eyes Skin Assessment     NAME:  Colleen [name]  DATE OF BIRTH:  1961-01-15  MEDICAL RECORD NUMBER:  [id number]    The patient is being assessed for  Shift Handoff    I agree that at least one RN has performed a thorough Head to Toe Skin Assessment on the patient. ALL assessment sites listed below have been assessed.      Areas assessed by both nurses:    Arms, Elbows, Hands, Sacrum. Buttock, Coccyx, Ischium, and Legs. Feet and Heels        Does the Patient have a Wound? Yes wound(s) were present on assessment. LDA wound assessment was Initiated and completed by RN       Braden Prevention initiated by RN: Yes  Wound Care Orders initiated by RN: No    Pressure Injury (Stage 3,4, Unstageable, DTI, NWPT, and Complex wounds) if present, place Wound referral order by RN under ORDER ENTRY: No    New Ostomies, if present place, Ostomy referral order under ORDER ENTRY: No     Nurse 1 eSignature: Electronically signed by Thom Chimes, RN on 01/21/24 at 12:37 AM EST    **SHARE this note so that the co-signing nurse can place an eSignature**    Nurse 2 eSignature: Electronically signed by Orland Dec, RN on 01/21/24 at 10:09 AM EST

## 2024-01-21 NOTE — Plan of Care (Signed)
Problem: Chronic Conditions and Co-morbidities  Goal: Patient's chronic conditions and co-morbidity symptoms are monitored and maintained or improved  01/21/2024 1913 by Thom Chimes, RN  Outcome: Progressing  01/21/2024 1037 by Orland Dec, RN  Outcome: Progressing  Flowsheets (Taken 01/21/2024 0809)  Care Plan - Patient's Chronic Conditions and Co-Morbidity Symptoms are Monitored and Maintained or Improved: Monitor and assess patient's chronic conditions and comorbid symptoms for stability, deterioration, or improvement     Problem: Pain  Goal: Verbalizes/displays adequate comfort level or baseline comfort level  01/21/2024 1913 by Thom Chimes, RN  Outcome: Progressing  01/21/2024 1037 by Orland Dec, RN  Outcome: Progressing  Flowsheets (Taken 01/20/2024 2153 by Thom Chimes, RN)  Verbalizes/displays adequate comfort level or baseline comfort level: Encourage patient to monitor pain and request assistance     Problem: Neurosensory - Adult  Goal: Achieves maximal functionality and self care  01/21/2024 1913 by Thom Chimes, RN  Outcome: Progressing  01/21/2024 1037 by Orland Dec, RN  Outcome: Progressing  Flowsheets (Taken 01/21/2024 0809)  Achieves maximal functionality and self care: Monitor swallowing and airway patency with patient fatigue and changes in neurological status     Problem: Skin/Tissue Integrity - Adult  Goal: Skin integrity remains intact  01/21/2024 1913 by Thom Chimes, RN  Outcome: Progressing  01/21/2024 1037 by Orland Dec, RN  Outcome: Progressing  Flowsheets (Taken 01/21/2024 0809)  Skin Integrity Remains Intact: Monitor for areas of redness and/or skin breakdown  Goal: Incisions, wounds, or drain sites healing without S/S of infection  01/21/2024 1913 by Thom Chimes, RN  Outcome: Progressing  01/21/2024 1037 by Orland Dec, RN  Outcome: Progressing  Flowsheets  Taken 01/21/2024 0809 by Orland Dec, RN  Incisions, Wounds, or Drain Sites Healing Without Sign and Symptoms of  Infection:   ADMISSION and DAILY: Assess and document risk factors for pressure ulcer development   TWICE DAILY: Assess and document skin integrity   TWICE DAILY: Assess and document dressing/incision, wound bed, drain sites and surrounding tissue   Implement wound care per orders  Taken 01/20/2024 2100 by Thom Chimes, RN  Incisions, Wounds, or Drain Sites Healing Without Sign and Symptoms of Infection: ADMISSION and DAILY: Assess and document risk factors for pressure ulcer development     Problem: Musculoskeletal - Adult  Goal: Return mobility to safest level of function  01/21/2024 1913 by Thom Chimes, RN  Outcome: Progressing  01/21/2024 1037 by Orland Dec, RN  Outcome: Progressing  Flowsheets (Taken 01/21/2024 0809)  Return Mobility to Safest Level of Function: Instruct patient/family in ordered activity level  Goal: Maintain proper alignment of affected body part  01/21/2024 1037 by Orland Dec, RN  Outcome: Progressing  Flowsheets (Taken 01/21/2024 0809)  Maintain proper alignment of affected body part: Instruct and reinforce with patient and family use of appropriate assistive device and precautions (e.g. spinal or hip dislocation precautions)  Goal: Return ADL status to a safe level of function  01/21/2024 1913 by Thom Chimes, RN  Outcome: Progressing  01/21/2024 1037 by Orland Dec, RN  Outcome: Progressing  Flowsheets (Taken 01/21/2024 0809)  Return ADL Status to a Safe Level of Function: Assist and instruct patient to increase activity and self care as tolerated     Problem: Safety - Adult  Goal: Free from fall injury  01/21/2024 1913 by Thom Chimes, RN  Outcome: Progressing  01/21/2024 1037 by Orland Dec, RN  Outcome: Progressing  Flowsheets (Taken 01/20/2024 1033)  Free From Fall Injury:   Instruct family/caregiver on patient  safety   Based on caregiver fall risk screen, instruct family/caregiver to ask for assistance with transferring infant if caregiver noted to have fall risk factors      Problem: Physical Therapy - Adult  Goal: By Discharge: Performs mobility at highest level of function for planned discharge setting.  See evaluation for individualized goals.  Description: Physical Therapy Short Term Goals  Initiated 01/20/2024 and to be accomplished within 7 day(s)  1.  Patient will supine <> sit  in bed with supervision/set-up to decrease risk of skin break.  2.  Patient will transfer from bed to chair and chair to bed with SBA using the least restrictive device in prep for sitting EOB ADL's.  3.  Patient will perform sit <> stand with SBA with FWW </= 50% verbal cues for task sequencing in prep for gait.     4.  Patient will ambulate with CGA </= 150 feet with the least restrictive device to progress towards independence.  5.  Patient will ascend/descend >/=8 stairs with B handrail(s) with CGA to progress toward home entrance/exit.     Physical Therapy Long Term Goals  Initiated 01/20/2024 and to be accomplished within 2 weeks  1.  Patient will perform all bed mobility task with independence to return to PLOF.    2.  Patient will transfer from bed to chair and chair to bed with independence using the least restrictive device to return to PLOF.   3.  Patient will perform sit <> stand with modified independence in prep for gait.  4.  Patient will ambulate with modified independence >/=  300 feet with the least restrictive device for safe home negotiation.    5.  Patient will ascend/descend 14 stairs with B handrail(s) with supervision/set-up for entrance/exit to home environment.     01/21/2024 0831 by Eustace Pen, PT  Outcome: Progressing

## 2024-01-21 NOTE — Progress Notes (Signed)
Chaplain conducted an initial consultation and Spiritual Assessment for Colleen [name], who is a 62 y.o.,female. Patient's Primary Language is: [place].   According to the patient's EMR Religious Affiliation is: Baptist.     The Chaplain provided the following Interventions:  Initiated a relationship of care and support to the patient in bed 175 where she has been for the last day and a half.   Explored issues of faith, belief, spirituality and religious/ritual needs while hospitalized.Patient is active in a local church setting.  Listened empathically. There is no advance directive on file yet.  Offered prayer and assurance of continued prayers on patients behalf.     The following outcomes were achieved:  Patient shared limited information about both their medical narrative and spiritual journey/beliefs.  Patient processed feeling about current hospitalization.  Patient expressed gratitude for pastoral care visit.    Spiritual Health History and Assessment/Progress Note  [facility]    Initial Encounter, Follow-up ([id number]), Follow up,  ,      Name: Colleen [name] MRN: [id number]    Age: 63 y.o.     Sex: female   Language: English   Religion: Baptist   Spinal stenosis     Date: 01/21/2024            Total Time Calculated: 8 min              Spiritual Assessment began in [facility]        Referral/Consult From: Rounding   Encounter Overview/Reason: Initial Encounter, Follow-up (iv-sa-eje)  Service Provided For: Patient (spinal stenosis)    Faith, Belief, Meaning:   Patient identifies as spiritual and is connected with a faith tradition or spiritual practice  Family/Friends No family/friends present      Importance and Influence:  Patient has spiritual/personal beliefs that influence decisions regarding their health  Family/Friends No family/friends present    Community:  Patient is connected with a spiritual community and feels well-supported. Support system includes: Parent/s and Faith  Community  Family/Friends No family/friends present    Assessment and Plan of Care:     Patient Interventions include: Facilitated expression of thoughts and feelings  Family/Friends Interventions include: No family/friends present    Patient Plan of Care: Spiritual Care available upon further referral  Family/Friends Plan of Care: No family/friends present    Electronically signed by Charmaine Downs., [facility] on 01/21/2024 at 2:08 PM

## 2024-01-21 NOTE — Plan of Care (Signed)
 Problem: Physical Therapy - Adult  Goal: By Discharge: Performs mobility at highest level of function for planned discharge setting.  See evaluation for individualized goals.  Description: Physical Therapy Short Term Goals  Initiated 01/20/2024 and to be accomplished within 7 day(s)  1.  Patient will supine <> sit  in bed with supervision/set-up to decrease risk of skin break.  2.  Patient will transfer from bed to chair and chair to bed with SBA using the least restrictive device in prep for sitting EOB ADL's.  3.  Patient will perform sit <> stand with SBA with FWW </= 50% verbal cues for task sequencing in prep for gait.     4.  Patient will ambulate with CGA </= 150 feet with the least restrictive device to progress towards independence.  5.  Patient will ascend/descend >/=8 stairs with B handrail(s) with CGA to progress toward home entrance/exit.     Physical Therapy Long Term Goals  Initiated 01/20/2024 and to be accomplished within 2 weeks  1.  Patient will perform all bed mobility task with independence to return to PLOF.    2.  Patient will transfer from bed to chair and chair to bed with independence using the least restrictive device to return to PLOF.   3.  Patient will perform sit <> stand with modified independence in prep for gait.  4.  Patient will ambulate with modified independence >/=  300 feet with the least restrictive device for safe home negotiation.    5.  Patient will ascend/descend 14 stairs with B handrail(s) with supervision/set-up for entrance/exit to home environment.     Outcome: Progressing     PHYSICAL THERAPY TREATMENT    Patient: Colleen [name] (63 y.o. female)  Date: 01/21/2024  Diagnosis: Spinal stenosis [M48.00]   Precautions: General Precautions;Fall Risk No Bending;No Lifting;No Twisting   No Pushing;No Pulling;5# Lifting Restrictions, Aspen Cervical Hard Collar on at all times, posterior drainage at CT junction     Chart, physical therapy assessment, plan of care and goals were  reviewed.    Time in: 0810  Time out : 0942    Patient seen for: bed mobility, functional transfers, therapeutic exercise, therapeutic activities, neuromuscular education, w/c mobility, gait training, energy conservation techniques, and patient education.      Pain:  Pt pain was reported as 1/10 in her neck pre-treatment.  Pt pain was reported as 1/10 post-treatment.  Intervention: RN present to administer pain mediation     Patient identified with name and DOB: Yes    SUBJECTIVE:      Patient states "it's 8 o'clock already."     OBJECTIVE DATA SUMMARY:    Objective:   Education: Education Given To: Patient  Education Provided: Home Exercise Program;Safety;Mobility Training;Transfer Training  Education Method: Demonstration;Verbal  Education Outcome: Verbalized understanding;Demonstrated understanding  BED/MAT MOBILITY Daily Assessment    Rolling Right Supervision    Rolling Left Supervision    Supine to Sit Stand by assistance    Sit to Supine  NT; patient remained in w/c post session      TRANSFERS Daily Assessment    Sit to Stand Stand by assistance    Transfer Assist Score Stand by assistance  Stand by assistance;Contact guard assistance          Comments    Patient focusing on using BLE's only and not Pharmacist, hospital  NT    Car Type NT        GAIT Daily  Assessment    Gait Deviations Slow Cadence;Decreased step length;Decreased head and trunk rotation;Shuffles    Assistive device Agricultural consultant    Ambulation assistance - surface  Stand by assistance;Contact guard assistance  Level tile    Distance 265 feet x 2    Comments     Ambulation-uneven surface   NT           STEPS/STAIRS Daily Assessment     Steps/Stairs ambulated 5  6"    Assistance Required Contact guard assistance    Rail Use Bilateral    Comments  stair negotiation 2-6" step 5 x 2 reps with B UE support with varying leading LE's and descending consistently with LLE     Curbs/Ramps  NT           BALANCE Daily Assessment    Posture Fair     Sitting - Static Fair;+    Sitting - Dynamic Fair    Standing - Static Fair;-    Standing - Dynamic Poor;+    Comments        EXERCISE   Sets   Reps   Active Active Assist   Passive Self ROM   Comments   Seated heel/toe raise 3 15  [x]  []   []   []    2# ankle weight   Seated LAQ 3 15   [x]   []   []   []     2# ankle weight   Seated alt march 3 15   [x]   []   []   []    2# ankle weight    Seated hip adduction squeeze 3 15   [x]   []   []   []   Small red bolster   Seated hip abduction 3 15   [x]   []   []   []   Red tband   Seated HS curl 3  15   [x]  []   []   []   Red tband      Neuro Re-Education:  Facilitates standing dynamic standing balance with FWW and varying LE reaching to tap 1/2 disc on floor following commands of number or color to promote unilateral weight bearing, activity tolerance, and proprioception. Standing on airex pad without UE support 30" x 3, also rolling foam dice followed by alternating march x the number shown on the dice.    ASSESSMENT:  Patient greeted supine in bed and pleasantly agrees to participate in skilled PT. Bed mobility supervision, supine > sit SBA, sit <> stand SBA/CGA. Gait training x 265 feet with FWW and mainly SBA with only need for CGA over small plate across tile in hallway. Instructed patient in seated there-ex focused on improving BLE strength with initiation of 2# ankle weight and red tband. Facilitated standing dynamic balance training on level and unstable surfaces focused on improving balance strategies and decrease risk of falls. Patient with 2 episodes of LOB posteriorly after rolling dice standing on airex pad requiring mod tactile cue to remain upright. Patient challenged with progression; however, no noted exacerbation in symptoms.     Progression toward goals:  [x]       Improving appropriately and progressing toward goals  []       Improving slowly and progressing toward goals  []       Not making progress toward goals and plan of care will be adjusted      PLAN:  Patient  continues to benefit from skilled intervention to address the above impairments.  Continue treatment per established plan of care.  Discharge Recommendations:  Home with Home health  PT;Home with assist PRN  Further Equipment Recommendations for Discharge:        Rolling             Estimated Discharge Date:02/02/2024    Activity Tolerance:   Fair  Please refer to the flowsheet for vital signs taken during this treatment.    After treatment:   []  Patient left in no apparent distress in bed  [x]  Patient left in no apparent distress sitting up in wheelchair with tray table to eat breakfast  []  Patient left in no apparent distress in w/c mobilizing under own power  []  Patient left in no apparent distress dining area  []  Patient left in no apparent distress mobilizing under own power  [x]  Call bell left within reach  []  Nursing notified  []  Caregiver present  []  Bed alarm activated   [x]  Chair alarm activated        Eustace Pen, PT  01/21/2024

## 2024-01-21 NOTE — Plan of Care (Signed)
Problem: Chronic Conditions and Co-morbidities  Goal: Patient's chronic conditions and co-morbidity symptoms are monitored and maintained or improved  01/21/2024 0026 by Thom Chimes, RN  Outcome: Progressing  01/20/2024 1033 by Orland Dec, RN  Outcome: Progressing  Flowsheets (Taken 01/20/2024 0906)  Care Plan - Patient's Chronic Conditions and Co-Morbidity Symptoms are Monitored and Maintained or Improved: Monitor and assess patient's chronic conditions and comorbid symptoms for stability, deterioration, or improvement     Problem: Pain  Goal: Verbalizes/displays adequate comfort level or baseline comfort level  01/21/2024 0026 by Thom Chimes, RN  Outcome: Progressing  Flowsheets (Taken 01/20/2024 2153)  Verbalizes/displays adequate comfort level or baseline comfort level: Encourage patient to monitor pain and request assistance  01/20/2024 1033 by Orland Dec, RN  Outcome: Progressing  Flowsheets (Taken 01/20/2024 1033)  Verbalizes/displays adequate comfort level or baseline comfort level:   Encourage patient to monitor pain and request assistance   Assess pain using appropriate pain scale   Administer analgesics based on type and severity of pain and evaluate response   Implement non-pharmacological measures as appropriate and evaluate response   Consider cultural and social influences on pain and pain management   Notify Licensed Independent Practitioner if interventions unsuccessful or patient reports new pain     Problem: Neurosensory - Adult  Goal: Achieves maximal functionality and self care  01/21/2024 0026 by Thom Chimes, RN  Outcome: Progressing  01/20/2024 1033 by Orland Dec, RN  Outcome: Progressing  Flowsheets (Taken 01/20/2024 0906)  Achieves maximal functionality and self care: Encourage and assist patient to increase activity and self care with guidance from physical therapy/occupational therapy     Problem: Skin/Tissue Integrity - Adult  Goal: Skin integrity remains intact  01/21/2024 0026 by  Thom Chimes, RN  Outcome: Progressing  01/20/2024 1033 by Orland Dec, RN  Outcome: Progressing  Flowsheets (Taken 01/20/2024 0906)  Skin Integrity Remains Intact: Monitor for areas of redness and/or skin breakdown  Goal: Incisions, wounds, or drain sites healing without S/S of infection  01/20/2024 1033 by Orland Dec, RN  Outcome: Progressing  Flowsheets (Taken 01/20/2024 0906)  Incisions, Wounds, or Drain Sites Healing Without Sign and Symptoms of Infection:   ADMISSION and DAILY: Assess and document risk factors for pressure ulcer development   TWICE DAILY: Assess and document skin integrity   TWICE DAILY: Assess and document dressing/incision, wound bed, drain sites and surrounding tissue   Implement wound care per orders     Problem: Musculoskeletal - Adult  Goal: Return mobility to safest level of function  01/21/2024 0026 by Thom Chimes, RN  Outcome: Progressing  01/20/2024 1033 by Orland Dec, RN  Outcome: Progressing  Flowsheets (Taken 01/20/2024 0906)  Return Mobility to Safest Level of Function: Instruct patient/family in ordered activity level  Goal: Maintain proper alignment of affected body part  01/20/2024 1033 by Orland Dec, RN  Outcome: Progressing  Flowsheets (Taken 01/20/2024 0906)  Maintain proper alignment of affected body part: Instruct and reinforce with patient and family use of appropriate assistive device and precautions (e.g. spinal or hip dislocation precautions)  Goal: Return ADL status to a safe level of function  01/21/2024 0026 by Thom Chimes, RN  Outcome: Progressing  01/20/2024 1033 by Orland Dec, RN  Outcome: Progressing  Flowsheets (Taken 01/20/2024 0906)  Return ADL Status to a Safe Level of Function: Assist and instruct patient to increase activity and self care as tolerated     Problem: Safety - Adult  Goal: Free from fall injury  01/21/2024 0026  by Thom Chimes, RN  Outcome: Progressing  01/20/2024 1033 by Orland Dec, RN  Outcome: Progressing  Flowsheets (Taken  01/20/2024 1033)  Free From Fall Injury:   Instruct family/caregiver on patient safety   Based on caregiver fall risk screen, instruct family/caregiver to ask for assistance with transferring infant if caregiver noted to have fall risk factors     Problem: Occupational Therapy - Adult  Goal: By Discharge: Performs self-care activities at highest level of function for planned discharge setting.  See evaluation for individualized goals.  Description: Occupational Therapy Goals   Long Term Goals  Initiated 01/20/2024 and to be accomplished within 2 week(s) 02/03/2024  1. Pt will perform self-feeding with ModI.  2. Pt will perform grooming with ModI.  3. Pt will perform UB bathing with ModI  4. Pt will perform LB bathing with ModI.  5. Pt will perform tub/shower transfer with ModI.   6. Pt will perform UB dressing with ModI.  7. Pt will perform LB dressing with ModI.  8. Pt will perform toileting task with ModI.  9. Pt will perform toilet transfer with ModI.  10.  Pt will perform an IADL task with good safety and ModI.       Short Term Goals   Initiated 01/20/2024 and to be accomplished within 7 day(s) 01/27/2024  1. Pt will perform self-feeding with S   2. Pt will perform grooming with S.  3. Pt will perform UB bathing with S.  4. Pt will perform LB bathing with S  5. Pt will perform tub/shower transfer with S.  6. Pt will perform UB dressing with S.  7. Pt will perform LB dressing with S  8. Pt will perform toileting task with S.  9. Pt will perform toilet transfer with S.         01/20/2024 1601 by Alinda Deem, OT  Outcome: Progressing

## 2024-01-22 LAB — POCT GLUCOSE
POC Glucose: 282 mg/dL — ABNORMAL HIGH (ref 70–110)
POC Glucose: 168 mg/dL — ABNORMAL HIGH (ref 70–110)
POC Glucose: 258 mg/dL — ABNORMAL HIGH (ref 70–110)
POC Glucose: 180 mg/dL — ABNORMAL HIGH (ref 70–110)

## 2024-01-22 MED FILL — OYSCO 500+D 500-5 MG-MCG PO TABS: 500-5- MG-MCG | ORAL | Qty: 1

## 2024-01-22 MED FILL — LANTUS 100 UNIT/ML SC SOLN: 100 UNIT/ML | SUBCUTANEOUS | Qty: 14

## 2024-01-22 MED FILL — HUMALOG 100 UNIT/ML IJ SOLN: 100 UNIT/ML | INTRAMUSCULAR | Qty: 2

## 2024-01-22 MED FILL — ATORVASTATIN CALCIUM 40 MG PO TABS: 40 MG | ORAL | Qty: 1

## 2024-01-22 MED FILL — LAMOTRIGINE 25 MG PO TABS: 25 MG | ORAL | Qty: 1

## 2024-01-22 MED FILL — PANTOPRAZOLE SODIUM 40 MG PO TBEC: 40 MG | ORAL | Qty: 1

## 2024-01-22 MED FILL — BUDESONIDE 0.5 MG/2ML IN SUSP: 0.52 MG/2ML | RESPIRATORY_TRACT | Qty: 4

## 2024-01-22 MED FILL — SERTRALINE HCL 50 MG PO TABS: 50 MG | ORAL | Qty: 1

## 2024-01-22 MED FILL — HUMALOG 100 UNIT/ML IJ SOLN: 100 UNIT/ML | INTRAMUSCULAR | Qty: 1

## 2024-01-22 MED FILL — LOSARTAN POTASSIUM 25 MG PO TABS: 25 MG | ORAL | Qty: 1

## 2024-01-22 MED FILL — BUSPIRONE HCL 5 MG PO TABS: 5 MG | ORAL | Qty: 3

## 2024-01-22 MED FILL — POLYETHYLENE GLYCOL 3350 17 G PO PACK: 17 g | ORAL | Qty: 1

## 2024-01-22 MED FILL — BAYER LOW DOSE 81 MG PO TBEC: 81 MG | ORAL | Qty: 1

## 2024-01-22 MED FILL — ACETAMINOPHEN 325 MG PO TABS: 325 MG | ORAL | Qty: 2

## 2024-01-22 MED FILL — GABAPENTIN 300 MG PO CAPS: 300 MG | ORAL | Qty: 1

## 2024-01-22 NOTE — Plan of Care (Signed)
[facility]  [address] [place], [place] [address]     INPATIENT REHABILITATION  OVERALL PLAN OF CARE    Name: Colleen [name] CSN: [id number]   Age: 63 y.o.  MRN: [id number]   Sex: female Admit Date: 01/19/2024     Primary Rehabilitation Diagnosis   Impaired mobility and ADLs in the setting of cervical spinal stenosis and radiculopathy, status post C3-T2 fusion and C4-6 laminectomy and C3 and C7 dome laminectomy     Other Co-Morbid Conditions managed in Rehab      Type 2 diabetes  Hypertension  Dyslipidemia  Anxiety and depression  GERD  Leukocytosis  MEDICAL PLAN:     Impaired mobility and ADLs in the setting of cervical spinal stenosis and radiculopathy, status post C3-T2 fusion and C4-6 laminectomy and C3 and C7 dome laminectomy  Physical therapy and Occupational Therapy  Keep hard cervical collar on at all times  Judicious pain control  Monitor wound     Type 2 diabetes  Continue Lantus 14 units at bedtime  Monitor sugars ACHS  Sliding scale insulin     Hypertension  Continue losartan 50 mg daily  As needed hydralazine  Monitor blood pressure     Dyslipidemia  Continue atorvastatin 40 mg daily     Anxiety and depression  Continue BuSpar 15 mg daily  Continue Zoloft 50 mg daily     GERD  Continue Protonix     Leukocytosis which appears to be improving.  Patient is afebrile and nontoxic.  No history of any cough fever or chills.  Patient denies any nausea or abdominal pain or diarrhea  Monitor intermittently     01/22/24-continue physical therapy and Occupational Therapy.  Monitor wound.  Continue Lantus and sliding scale insulin.  Monitor blood pressure.  Check CBC in the morning.  I discussed care plan with the patient      Recent Labs     01/20/24  0557   WBC 13.5*   HGB 10.4*   HCT 32.8*   PLT 227     Recent Labs     01/20/24  0557   NA 142   K 3.8   CL 111   CO2 27   BUN 21*   MG 2.1        ANTICIPATED INTERVENTIONS THAT SUPPORT THE MEDICAL NECESSITY OF THIS ADMISSION:    I. Physical  Therapy              A. Intensity: 1-2 hour per day              B. Frequency: 5 times per week              C. Duration: 2 weeks              D. Short Term Goals:    Initiated 01/20/2024 and to be accomplished within 7 day(s)  1. Patient will supine <> sit in bed with supervision/set-up to decrease risk of skin break.  2. Patient will transfer from bed to chair and chair to bed with SBA using the least restrictive device in prep for sitting EOB ADL's.  3. Patient will perform sit <> stand with SBA with FWW </= 50% verbal cues for task sequencing in prep for gait.   4. Patient will ambulate with CGA </= 150 feet with the least restrictive device to progress towards independence.  5. Patient will ascend/descend >/=8 stairs with B handrail(s) with CGA to progress toward home entrance/exit.  E. Long Term Goals:   Initiated 01/20/2024 and to be accomplished within 2 weeks  1. Patient will perform all bed mobility task with independence to return to PLOF.   2. Patient will transfer from bed to chair and chair to bed with independence using the least restrictive device to return to PLOF.   3. Patient will perform sit <> stand with modified independence in prep for gait.  4. Patient will ambulate with modified independence >/= 300 feet with the least restrictive device for safe home negotiation.   5. Patient will ascend/descend 14 stairs with B handrail(s) with supervision/set-up for entrance/exit to home environment.     F. Interventions:                  Pt would benefit from skilled physical therapy in order to improve independent functional mobility within the home. Interventions may include range of motion (AROM, PROM B LE/trunk), motor function (B LE/trunk strengthening/coordination), activity tolerance (vitals, oxygen saturation levels), bed mobility training, balance activities, gait training (progressive ambulation program), wheelchair management and functional transfer training. Patient would also  benefit from concurrent and group therapy sessions to promote increased participation in skilled therapy interventions and to provide opportunities for increased social interaction.          II. Occupational Therapy              A. Intensity: 1-2 hour per day              B. Frequency: 5 times per week              C. Duration: 2 weeks              D. Short Term Goals:    Initiated 01/20/2024 and to be accomplished within 7 day(s) 01/27/2024  1. Pt will perform self-feeding with S   2. Pt will perform grooming with S.  3. Pt will perform UB bathing with S.  4. Pt will perform LB bathing with S  5. Pt will perform tub/shower transfer with S.  6. Pt will perform UB dressing with S.  7. Pt will perform LB dressing with S  8. Pt will perform toileting task with S.  9. Pt will perform toilet transfer with S.               E. Long Term Goals:    Initiated 01/20/2024 and to be accomplished within 2 week(s) 02/03/2024  1. Pt will perform self-feeding with ModI.  2. Pt will perform grooming with ModI.  3. Pt will perform UB bathing with ModI  4. Pt will perform LB bathing with ModI.  5. Pt will perform tub/shower transfer with ModI.   6. Pt will perform UB dressing with ModI.  7. Pt will perform LB dressing with ModI.  8. Pt will perform toileting task with ModI.  9. Pt will perform toilet transfer with ModI.  10. Pt will perform an IADL task with good safety and ModI.    F. Interventions:                            Pt would benefit from skilled occupational therapy in order to improve independent functional mobility/ADLs,/IADLs within the home. Interventions may include range of motion (AROM, PROM B UE), motor function (B UE/ strengthening/coordination), activity tolerance (vitals, oxygen saturation levels), balance training, ADL/IADL training and functional transfer training.  PHYSICIAN'S ASSESSMENT OF FINDINGS:    Are the established goals sufficient for achieving the optimal level of function?    [x]   Yes      []    No    What changes would you recommend to the goals as written? None      Are the interventions noted sufficient for achieving the optimal level of function?    [x]   Yes      []   No    What changes would you recommend to the interventions noted? If therapy staff is unable to provide 3 hr of total therapy per day in 5 days due to medical issues or decreased patient tolerance, may modify treatment schedule to 15 hr/week.      Estimated length of stay: 14 days    Willingness to participate in the program: Good      Medical rehabilitation prognosis:    []   Excellent     [x]   Good     []   Fair     []   Guarded       Discharge Destination:     [x]   Home     []   Assisted Living Facility     []   Skilled Nursing Facility     []   [facility]     []   Long Term Care Facility     []   Other:       Signed:    Jonah Blue, MD      January 22, 2024

## 2024-01-22 NOTE — Plan of Care (Signed)
Problem: Pain  Goal: Verbalizes/displays adequate comfort level or baseline comfort level  Outcome: Progressing     Problem: Chronic Conditions and Co-morbidities  Goal: Patient's chronic conditions and co-morbidity symptoms are monitored and maintained or improved  Outcome: Progressing  Flowsheets (Taken 01/22/2024 1850)  Care Plan - Patient's Chronic Conditions and Co-Morbidity Symptoms are Monitored and Maintained or Improved: Monitor and assess patient's chronic conditions and comorbid symptoms for stability, deterioration, or improvement     Problem: Neurosensory - Adult  Goal: Achieves maximal functionality and self care  Outcome: Progressing     Problem: Skin/Tissue Integrity - Adult  Goal: Skin integrity remains intact  Outcome: Progressing  Flowsheets (Taken 01/22/2024 1850)  Skin Integrity Remains Intact: Monitor for areas of redness and/or skin breakdown     Problem: Skin/Tissue Integrity - Adult  Goal: Incisions, wounds, or drain sites healing without S/S of infection  Outcome: Progressing  Flowsheets (Taken 01/22/2024 1850)  Incisions, Wounds, or Drain Sites Healing Without Sign and Symptoms of Infection: TWICE DAILY: Assess and document skin integrity     Problem: Musculoskeletal - Adult  Goal: Return mobility to safest level of function  Outcome: Progressing  Flowsheets (Taken 01/22/2024 1850)  Return Mobility to Safest Level of Function: Assist with transfers and ambulation using safe patient handling equipment as needed     Problem: Musculoskeletal - Adult  Goal: Maintain proper alignment of affected body part  Outcome: Progressing  Flowsheets (Taken 01/22/2024 1850)  Maintain proper alignment of affected body part: Support and protect limb and body alignment per provider's orders     Problem: Musculoskeletal - Adult  Goal: Return ADL status to a safe level of function  Outcome: Progressing  Flowsheets (Taken 01/22/2024 1850)  Return ADL Status to a Safe Level of Function: Assess activities of daily living  deficits and provide assistive devices as needed     Problem: Safety - Adult  Goal: Free from fall injury  Outcome: Progressing  Flowsheets (Taken 01/22/2024 1906)  Free From Fall Injury: Instruct family/caregiver on patient safety

## 2024-01-22 NOTE — Plan of Care (Signed)
Problem: Occupational Therapy - Adult  Goal: By Discharge: Performs self-care activities at highest level of function for planned discharge setting.  See evaluation for individualized goals.  Description: Occupational Therapy Goals   Long Term Goals  Initiated 01/20/2024 and to be accomplished within 2 week(s) 02/03/2024  1. Pt will perform self-feeding with ModI.  2. Pt will perform grooming with ModI.  3. Pt will perform UB bathing with ModI  4. Pt will perform LB bathing with ModI.  5. Pt will perform tub/shower transfer with ModI.   6. Pt will perform UB dressing with ModI.  7. Pt will perform LB dressing with ModI.  8. Pt will perform toileting task with ModI.  9. Pt will perform toilet transfer with ModI.  10.  Pt will perform an IADL task with good safety and ModI.       Short Term Goals   Initiated 01/20/2024 and to be accomplished within 7 day(s) 01/27/2024  1. Pt will perform self-feeding with S   2. Pt will perform grooming with S.  3. Pt will perform UB bathing with S.  4. Pt will perform LB bathing with S  5. Pt will perform tub/shower transfer with S.  6. Pt will perform UB dressing with S.  7. Pt will perform LB dressing with S  8. Pt will perform toileting task with S.  9. Pt will perform toilet transfer with S.         Outcome: Progressing  Occupational Therapy TREATMENT    Patient: Colleen [name]   63 y.o.    Patient identified with name and DOB: Yes     Date: 01/22/2024    First Tx Session  Time In:  1300  Time Out:  1430  Diagnosis: Spinal stenosis [M48.00]   Precautions:  Restrictions/Precautions: General Precautions, Fall Risk  Required Braces or Orthoses?: Yes Spinal Precautions: No Bending, No Lifting, No Twisting  Sternal Precautions: No Pushing, No Pulling, 5# Lifting Restrictions                           Chart, occupational therapy assessment, plan of care, and goals were reviewed.     Pain:  Pt reports 2/10 pain or discomfort prior to treatment across shoulders  Pt reports 2/10 pain or discomfort  post treatment.   Intervention Provided: N/A      SUBJECTIVE:   Patient stated     OBJECTIVE DATA SUMMARY:     COGNITION/PERCEPTION Daily Assessment   Orientation status  Ox4   Cognitive status        THERAPEUTIC ACTIVITY Daily Assessment      Pt completed large peg task placed on 65 degree incline.  Pt required increased time to problem placement and take pegs out with RUE.               THERAPEUTIC EXERCISE Daily Assessment    Pt propelled w/c from room<>gym using BLE.  Theraputty (orange)- Bilateral squeezes 3x15.        LOWER BODY DRESSING/UNDRESSING Daily Assessment   Functional Level Setup   Clinical Factors  Pt doffed sock and donned sneaker EOB.  Reacher given to increase ease for task.        WHEELCHAIR/BED TRANSFER INDEPENDENCE Daily Assessment   Transfer Technique Stand step   Level of Assistance Contact guard assistance   Equipment     Comments Pt performed sit to stand then side step to bed with SBA.  Pt required  vcs for tech.     Activity Tolerance:    Fair           EDUCATION:   Education Given To: Patient  Education Method: Demonstration;Verbal  Education Outcome: Verbalized understanding;Continued education needed    ASSESSMENT:  Pt increasing strength and FM dexterity for ADLs.   Progression toward goals:  [x]           Improving appropriately and progressing toward goals  []           Improving slowly and progressing toward goals  []           Not making progress toward goals and plan of care will be adjusted       PLAN:  Patient continues to benefit from skilled intervention to address the above impairments.  Continue treatment per established plan of care.  Discharge Recommendations:  Home Health  Further Equipment Recommendations for Discharge:   N/A  Estimated LOS: 2 weeks      COMMUNICATION/EDUCATION:   []  Home safety education was provided and the patient/caregiver indicated understanding.  [x]  Patient/family have participated as able in goal setting and plan of care.  []  Patient/family agree  to work toward stated goals and plan of care.  []  Patient understands intent and goals of therapy, but is neutral about his/her participation.  []  Patient is unable to participate in goal setting and plan of care.    Please refer to the flowsheet for vital signs taken during this treatment.  After treatment:   []   Patient left in no apparent distress sitting up in chair   [x]   Patient left in no apparent distress in bed  []   Patient handoff to SLP/PT  []   Call bell and immediate needs left within reach  []   Nursing notified  []   Caregiver present  []   Bed alarm activated      Entered Differentiated Treatment minutes: Yes       Alinda Deem, OT  01/22/2024

## 2024-01-22 NOTE — Progress Notes (Shared)
4 Eyes Skin Assessment     NAME:  Bunny Kleist  DATE OF BIRTH:  Mar 21, 1961  MEDICAL RECORD NUMBER:  [id number]    The patient is being assessed for  Shift Handoff    I agree that at least one RN has performed a thorough Head to Toe Skin Assessment on the patient. ALL assessment sites listed below have been assessed.      Areas assessed by both nurses:    Head, Face, Ears, Shoulders, Back, Chest, Arms, Elbows, Hands, Sacrum. Buttock, Coccyx, Ischium, and Legs. Feet and Heels        Does the Patient have a Wound? Yes wound(s) were present on assessment. LDA wound assessment was Initiated and completed by RN       Braden Prevention initiated by RN: Yes  Wound Care Orders initiated by RN: No    Pressure Injury (Stage 3,4, Unstageable, DTI, NWPT, and Complex wounds) if present, place Wound referral order by RN under ORDER ENTRY: No    New Ostomies, if present place, Ostomy referral order under ORDER ENTRY: Yes     Nurse 1 eSignature: Electronically signed by Precious Gilding, RN on 01/22/24 at 6:24 PM EST    **SHARE this note so that the co-signing nurse can place an eSignature**    Nurse 2 eSignature: {Esignature:[id number]}

## 2024-01-22 NOTE — Progress Notes (Addendum)
0800 PT. Awake sitting up in bed alert and oriented x 4 no change in assessment.   0930 Pt. Sitting up in chair eating breakfast. Dressing to posterior neck dry and intact.   1200 with therapy.   1330 able to transfer from bed to chair with assist.   1500 no change in assessment.   1745 Pt. Sitting up in chair eating dinner.

## 2024-01-22 NOTE — Progress Notes (Signed)
4 Eyes Skin Assessment     NAME:  Colleen [name]  DATE OF BIRTH:  [birth year]  MEDICAL RECORD NUMBER:  [id number]    The patient is being assessed for  Shift Handoff    I agree that at least one RN has performed a thorough Head to Toe Skin Assessment on the patient. ALL assessment sites listed below have been assessed.      Areas assessed by both nurses:    Arms, Elbows, Hands, Sacrum. Buttock, Coccyx, Ischium, Legs. Feet and Heels, and Under Medical Devices         Does the Patient have a Wound? Yes wound(s) were present on assessment. LDA wound assessment was Initiated and completed by RN       Braden Prevention initiated by RN: Yes  Wound Care Orders initiated by RN: No    Pressure Injury (Stage 3,4, Unstageable, DTI, NWPT, and Complex wounds) if present, place Wound referral order by RN under ORDER ENTRY: No    New Ostomies, if present place, Ostomy referral order under ORDER ENTRY: No     Nurse 1 eSignature: Electronically signed by Thom Chimes, RN on 01/22/24 at 6:26 AM EST    **SHARE this note so that the co-signing nurse can place an eSignature**    Nurse 2 eSignature: Electronically signed by Precious Gilding, RN on 01/22/24 at 07:15 AM EST

## 2024-01-22 NOTE — Progress Notes (Signed)
[facility]  [address] [place], [place] [address]     INPATIENT REHABILITATION  DAILY PROGRESS NOTE     Date: 01/22/2024    Name: Colleen [name] Age / Sex: 63 y.o. / female   CSN: [id number] MRN: [id number]   Admit Date: 01/19/2024 Length of Stay: 3 days     Primary Rehabilitation Diagnosis   Impaired mobility and ADLs in the setting of cervical spinal stenosis and radiculopathy, status post C3-T2 fusion and C4-6 laminectomy and C3 and C7 dome laminectomy      Subjective:     I personally saw and evaluated this patient face-to-face today.  Patient is laying in bed in no apparent distress.  Patient states pain is controlled.  Patient is wearing a hard cervical collar.    Objective:     Vital Signs:  Patient Vitals for the past 24 hrs:   BP Temp Temp src Pulse Resp SpO2   01/22/24 1632 109/71 99.2 F (37.3 C) Oral 66 17 97 %   01/22/24 0720 118/70 98.6 F (37 C) Oral 62 17 95 %   01/22/24 0400 126/67 97.6 F (36.4 C) Oral 71 17 98 %   01/21/24 2000 117/60 97.6 F (36.4 C) Oral 75 19 97 %        Physical Examination:  General:  Awake, alert  Cardiovascular:  S1S2+, RRR  Pulmonary:  CTA b/l  GI:  Soft, BS+, NT, ND  Extremities:  No edema  Has right upper extremity weakness    Current Medications:  Current Facility-Administered Medications   Medication Dose Route Frequency    albuterol (PROVENTIL) (2.5 MG/3ML) 0.083% nebulizer solution 2.5 mg  2.5 mg Nebulization Q4H PRN    aspirin EC tablet 81 mg  81 mg Oral Daily    atorvastatin (LIPITOR) tablet 40 mg  40 mg Oral Daily    busPIRone (BUSPAR) tablet 15 mg  15 mg Oral Daily    budesonide (PULMICORT) nebulizer suspension 1,000 mcg  1 mg Nebulization BID RT    gabapentin (NEURONTIN) capsule 300 mg  300 mg Oral Nightly    losartan (COZAAR) tablet 25 mg  25 mg Oral Daily    hydrALAZINE (APRESOLINE) tablet 10 mg  10 mg Oral Q6H PRN    nitroGLYCERIN (NITROSTAT) SL tablet 0.4 mg  0.4 mg SubLINGual Q5 Min PRN    sertraline (ZOLOFT) tablet 50 mg   50 mg Oral Daily    oyster shell calcium w/D 500-5 MG-MCG tablet 1 tablet  1 tablet Oral BID    insulin glargine (LANTUS) injection vial 14 Units  14 Units SubCUTAneous Nightly    polyethylene glycol (GLYCOLAX) packet 17 g  17 g Oral Daily    pantoprazole (PROTONIX) tablet 40 mg  40 mg Oral QAM AC    lamoTRIgine (LAMICTAL) tablet 25 mg  25 mg Oral BID    oxyCODONE (ROXICODONE) immediate release tablet 5 mg  5 mg Oral Q6H PRN    naloxone (NARCAN) injection 0.4 mg  0.4 mg IntraVENous PRN    methocarbamol (ROBAXIN) tablet 500 mg  500 mg Oral Q6H PRN    glucose chewable tablet 16 g  4 tablet Oral PRN    dextrose bolus 10% 125 mL  125 mL IntraVENous PRN    Or    dextrose bolus 10% 250 mL  250 mL IntraVENous PRN    glucagon emergency SOLR 1 mg  1 mg SubCUTAneous PRN    dextrose 10 % infusion   IntraVENous Continuous  PRN    acetaminophen (TYLENOL) tablet 650 mg  650 mg Oral Q4H PRN    bisacodyl (DULCOLAX) EC tablet 5 mg  5 mg Oral Daily PRN    insulin lispro (HUMALOG,ADMELOG) injection vial 0-4 Units  0-4 Units SubCUTAneous 4x Daily AC & HS       Allergies:  Allergies   Allergen Reactions    Amoxicillin Itching, Rash and Other (See Comments)     63may07 mss RASH AND BREAKOUT...[phone]       Lab/Data Review:  Recent Results (from the past 24 hour(s))   POCT Glucose    Collection Time: 01/21/24  9:02 PM   Result Value Ref Range    POC Glucose 282 (H) 70 - 110 mg/dL   POCT Glucose    Collection Time: 01/22/24  7:19 AM   Result Value Ref Range    POC Glucose 168 (H) 70 - 110 mg/dL   POCT Glucose    Collection Time: 01/22/24 11:09 AM   Result Value Ref Range    POC Glucose 258 (H) 70 - 110 mg/dL   POCT Glucose    Collection Time: 01/22/24  4:31 PM   Result Value Ref Range    POC Glucose 180 (H) 70 - 110 mg/dL         Assessment:     Primary Rehabilitation Diagnosis   Impaired mobility and ADLs in the setting of cervical spinal stenosis and radiculopathy, status post C3-T2 fusion and C4-6 laminectomy and C3 and C7 dome  laminectomy     Other Co-Morbid Conditions managed in Rehab      Type 2 diabetes  Hypertension  Dyslipidemia  Anxiety and depression  GERD  Leukocytosis  MEDICAL PLAN:     Impaired mobility and ADLs in the setting of cervical spinal stenosis and radiculopathy, status post C3-T2 fusion and C4-6 laminectomy and C3 and C7 dome laminectomy  Physical therapy and Occupational Therapy  Keep hard cervical collar on at all times  Judicious pain control  Monitor wound     Type 2 diabetes  Continue Lantus 14 units at bedtime  Monitor sugars ACHS  Sliding scale insulin     Hypertension  Continue losartan 50 mg daily  As needed hydralazine  Monitor blood pressure     Dyslipidemia  Continue atorvastatin 40 mg daily     Anxiety and depression  Continue BuSpar 15 mg daily  Continue Zoloft 50 mg daily     GERD  Continue Protonix     Leukocytosis which appears to be improving.  Patient is afebrile and nontoxic.  No history of any cough fever or chills.  Patient denies any nausea or abdominal pain or diarrhea  Monitor intermittently    01/22/24-continue physical therapy and Occupational Therapy.  Monitor wound.  Continue Lantus and sliding scale insulin.  Monitor blood pressure.  Check CBC in the morning.  I discussed care plan with the patient    There is no height or weight on file to calculate BMI.      Functional Progress:    PHYSICAL THERAPY    ON ADMISSION MOST RECENT   Balance  Balance  Posture: Fair  Sitting - Static: Fair, +  Sitting - Dynamic: Fair  Standing - Static: Fair, -  Standing - Dynamic: Poor, +                     Balance  Balance  Posture: Fair  Sitting - Static: Fair, +  Sitting -  Dynamic: Fair  Standing - Static: Fair, -  Standing - Dynamic: Poor, +                       Bed Mobility  Stand by assistance  Stand by assistance  Contact guard assistance  Contact guard assistance  Contact guard assistance  Contact guard assistance  Contact guard assistance Bed Mobility  Rolling to Right: Supervision  Rolling to Left:  Supervision  Supine to Sit: Stand by assistance  Sit to Stand: Stand by assistance  Sit to Supine: Contact guard assistance  Bed to Chair: Stand by assistance  Car Transfer: Contact guard assistance   Wheelchair Mobility  Yes       Wheelchair Mobility  Propulsion: No         Ambulation  Programmer, systems guard assistance  Level tile  65 feet Ambulation  Device: Agricultural consultant  Assistance: Stand by assistance, Contact guard assistance  Surface: Level tile  Distance: 265 feet x 2   Stairs  Yes  Bilateral  Contact guard assistance Stairs  Stairs?: Yes  Rails: Bilateral  Assistance: Contact guard assistance     Functional Progress:    OCCUPATIONAL THERAPY    ON ADMISSION MOST RECENT   Eating  Supervision Eating  Feeding: Supervision   Grooming  Setup Grooming  Grooming: Setup   Bathing  UB Bathing Stand by assistance  LB Bathing Stand by assistance Bathing  UB Bathing   UE Bathing: Stand by assistance  LB Bathing   ZHYQ([id number]:BMWU)@   Upper Body Dressing  Minimal assistance   Upper Body Dressing  UE Dressing: Minimal assistance   Lower Body Dressing  Stand by assistance Lower Body Dressing  LE Dressing: Setup   Toileting  Stand by assistance Toileting  Toileting: Stand by assistance   Toilet Transfers  Stand by Clinical biochemist: Stand by assistance   Tub Transfers  Tub Transfers: Not tested  [employer] Transfers: Not tested Tub Transfers  Tub Transfers  Tub Transfers: Not tested  [employer] Transfers  Tub Transfers  Tub Transfers: Not tested     Legend:   7 - Independent   6 - Modified Independent   5 - Standby Assistance / Supervision / Set-up   4 - Minimum Assistance / Contact Guard Assistance   3 - Moderate Assistance   2 - Maximum Assistance   1 - Total Assistance / Dependent             Plan:     1. Justification for continued stay: Good progression towards established rehabilitation goals.    2. Medical Issues being followed closely:    [x]   Fall and safety  precautions     [x]   Wound Care     [x]   Bowel and Bladder Function     [x]   Fluid Electrolyte and Nutrition Balance     [x]   Pain Control      3. Issues that 24 hour rehabilitation nursing is following:    [x]   Fall and safety precautions     [x]   Wound Care     [x]   Bowel and Bladder Function     [x]   Fluid Electrolyte and Nutrition Balance     [x]   Pain Control      [x]   Assistance with and education on in-room safety with transfers to and from the bed, wheelchair, toilet and shower.      4. Acute rehabilitation  plan of care:    [x]   Continue current care and rehab.           [x]   Physical Therapy           [x]   Occupational Therapy           []   Speech Therapy      []   Hold Rehab until further notice     5. Medications:    [x]   MAR Reviewed     [x]   Continue Present Medications       6. Chemical DVT Prophylaxis:      []   Enoxaparin     []   Unfractionated Heparin     []   Warfarin     []   NOAC     []   Aspirin     [x]   None     7. Mechanical DVT Prophylaxis:      [x]   TED Stockings     [x]   Sequential Compression Device     []   None     8. GI Prophylaxis:      [x]   PPI     []   H2 Blocker     []   None / Not indicated     9. Code status:Full     Dragon medical dictation software was used for portions of this report. Unintended errors may occur.      Signed:    Jonah Blue, MD      January 22, 2024

## 2024-01-22 NOTE — Progress Notes (Signed)
Reason for Admission to Rehab: Spinal stenosis [M48.00]       PCP: First and Last name:  Kerri Perches, PA     Preferred Pharmacy: [facility]      COVID vaccine: yes                            Top Challenges facing patient (as identified by patient/family and CM):                               Transportation? Patient states that prior to admission she was able to drive herself to her appointments.               Support system or lack thereof? Patient states that her brother lives locally and is able to check in on her during the day.                      Living arrangements? Patient states that she lives alone in a 2 level apartment with 14 steps with one handrail to get to her living area/bathroom/bedroom. Patient states that she has a tub shower.            Self-care/ADLs/Cognition?  Prior to admission, patient was independent with her mobility and ADL's.     Home medical equipment? none          Current Advanced Directive/Advance Care Plan:  Full Code No additional code details      Healthcare Decision Maker: Self      Payor Source Payor: [employer] [place] / Plan: [employer] [place] PRIME / Product Type: *No Product type* /       Caregiver designated for family/ caregiver education sessions with team: Patient states that brother [phone]) will be helping her at home. Patient gave verbal consent to allowing staff to call him to schedule CGE when needed.                             Plan for utilizing home health: Patient states that she has never had home health or been in a SNF to receive therapy in the past.                    Transition of Care Plan:    Home                Billee Cashing, RN

## 2024-01-23 LAB — BASIC METABOLIC PANEL
Anion Gap: 5 mmol/L (ref 3.0–18)
BUN/Creatinine Ratio: 39 — ABNORMAL HIGH (ref 12–20)
BUN: 22 mg/dL — ABNORMAL HIGH (ref 7.0–18)
CO2: 27 mmol/L (ref 21–32)
Calcium: 9.2 mg/dL (ref 8.5–10.1)
Chloride: 109 mmol/L (ref 100–111)
Creatinine: 0.57 mg/dL — ABNORMAL LOW (ref 0.6–1.3)
Est, Glom Filt Rate: >90 mL/min/{1.73_m2} (ref >60)
Glucose: 169 mg/dL — ABNORMAL HIGH (ref 74–99)
Potassium: 3.9 mmol/L (ref 3.5–5.5)
Sodium: 141 mmol/L (ref 136–145)

## 2024-01-23 LAB — CBC WITH AUTO DIFFERENTIAL
Basophils %: 0.2 % (ref 0.0–2.0)
Basophils Absolute: 0.02 10*3/uL (ref 0.00–0.10)
Eosinophils %: 1.9 % (ref 0.0–5.0)
Eosinophils Absolute: 0.22 10*3/uL (ref 0.00–0.40)
Hematocrit: 32.3 % — ABNORMAL LOW (ref 35.0–45.0)
Hemoglobin: 10.1 g/dL — ABNORMAL LOW (ref 12.0–16.0)
Immature Granulocytes %: 0.3 % (ref 0.0–0.5)
Immature Granulocytes Absolute: 0.03 10*3/uL (ref 0.00–0.04)
Lymphocytes %: 33.1 % (ref 21.0–52.0)
Lymphocytes Absolute: 3.9 10*3/uL — ABNORMAL HIGH (ref 0.90–3.60)
MCH: 27.3 pg (ref 24.0–34.0)
MCHC: 31.3 g/dL (ref 31.0–37.0)
MCV: 87.3 fL (ref 78.0–100.0)
MPV: 10.7 fL (ref 9.2–11.8)
Monocytes %: 5.9 % (ref 3.0–10.0)
Monocytes Absolute: 0.7 10*3/uL (ref 0.05–1.20)
Neutrophils %: 58.6 % (ref 40.0–73.0)
Neutrophils Absolute: 6.92 10*3/uL (ref 1.80–8.00)
Nucleated RBCs: 0 /100{WBCs}
Platelets: 248 10*3/uL (ref 135–420)
RBC: 3.7 M/uL — ABNORMAL LOW (ref 4.20–5.30)
RDW: 14.4 % (ref 11.6–14.5)
WBC: 11.8 10*3/uL (ref 4.6–13.2)
nRBC: 0 10*3/uL (ref 0.00–0.01)

## 2024-01-23 LAB — POCT GLUCOSE
POC Glucose: 211 mg/dL — ABNORMAL HIGH (ref 70–110)
POC Glucose: 209 mg/dL — ABNORMAL HIGH (ref 70–110)
POC Glucose: 173 mg/dL — ABNORMAL HIGH (ref 70–110)
POC Glucose: 188 mg/dL — ABNORMAL HIGH (ref 70–110)

## 2024-01-23 MED FILL — ATORVASTATIN CALCIUM 40 MG PO TABS: 40 MG | ORAL | Qty: 1

## 2024-01-23 MED FILL — BUDESONIDE 0.5 MG/2ML IN SUSP: 0.52 MG/2ML | RESPIRATORY_TRACT | Qty: 2

## 2024-01-23 MED FILL — PANTOPRAZOLE SODIUM 40 MG PO TBEC: 40 MG | ORAL | Qty: 1

## 2024-01-23 MED FILL — HUMALOG 100 UNIT/ML IJ SOLN: 100 UNIT/ML | INTRAMUSCULAR | Qty: 1

## 2024-01-23 MED FILL — BUSPIRONE HCL 5 MG PO TABS: 5 MG | ORAL | Qty: 3

## 2024-01-23 MED FILL — GABAPENTIN 300 MG PO CAPS: 300 MG | ORAL | Qty: 1

## 2024-01-23 MED FILL — ACETAMINOPHEN 325 MG PO TABS: 325 MG | ORAL | Qty: 2

## 2024-01-23 MED FILL — BUDESONIDE 0.5 MG/2ML IN SUSP: 0.52 MG/2ML | RESPIRATORY_TRACT | Qty: 4

## 2024-01-23 MED FILL — LAMOTRIGINE 25 MG PO TABS: 25 MG | ORAL | Qty: 1

## 2024-01-23 MED FILL — HUMALOG 100 UNIT/ML IJ SOLN: 100 UNIT/ML | INTRAMUSCULAR | Qty: 2

## 2024-01-23 MED FILL — SERTRALINE HCL 50 MG PO TABS: 50 MG | ORAL | Qty: 1

## 2024-01-23 MED FILL — BAYER LOW DOSE 81 MG PO TBEC: 81 MG | ORAL | Qty: 1

## 2024-01-23 MED FILL — OYSTER SHELL CALCIUM W/D 500-5 MG-MCG PO TABS: 500-5- MG-MCG | ORAL | Qty: 1

## 2024-01-23 MED FILL — LOSARTAN POTASSIUM 25 MG PO TABS: 25 MG | ORAL | Qty: 1

## 2024-01-23 MED FILL — POLYETHYLENE GLYCOL 3350 17 G PO PACK: 17 g | ORAL | Qty: 1

## 2024-01-23 MED FILL — LANTUS 100 UNIT/ML SC SOLN: 100 UNIT/ML | SUBCUTANEOUS | Qty: 14

## 2024-01-23 NOTE — Plan of Care (Addendum)
Problem: Occupational Therapy - Adult  Goal: By Discharge: Performs self-care activities at highest level of function for planned discharge setting.  See evaluation for individualized goals.  Description: Occupational Therapy Goals   Long Term Goals  Initiated 01/20/2024 and to be accomplished within 2 week(s) 02/03/2024  1. Pt will perform self-feeding with ModI.  2. Pt will perform grooming with ModI.  3. Pt will perform UB bathing with ModI  4. Pt will perform LB bathing with ModI.  5. Pt will perform tub/shower transfer with ModI.   6. Pt will perform UB dressing with ModI.  7. Pt will perform LB dressing with ModI.  8. Pt will perform toileting task with ModI.  9. Pt will perform toilet transfer with ModI.  10.  Pt will perform an IADL task with good safety and ModI.     Short Term Goals   Initiated 01/20/2024 and to be accomplished within 7 day(s) 01/27/2024  1. Pt will perform self-feeding with S   2. Pt will perform grooming with S.  3. Pt will perform UB bathing with S.  4. Pt will perform LB bathing with S  5. Pt will perform tub/shower transfer with S.  6. Pt will perform UB dressing with S.  7. Pt will perform LB dressing with S  8. Pt will perform toileting task with S.  9. Pt will perform toilet transfer with S.         Outcome: Progressing   Occupational Therapy TREATMENT    Patient: Kushi Kun   63 y.o.    Patient identified with name and DOB: yes    Date: 01/23/2024    First Tx Session  Time In: 1020  Time Out: 1155    Diagnosis: Spinal stenosis [M48.00]   Precautions: Restrictions/Precautions: General Precautions, Fall Risk  Required Braces or Orthoses?: Yes Spinal Precautions: No Bending, No Lifting, No Twisting  Sternal Precautions: No Pushing, No Pulling, 5# Lifting Restrictions             Chart, occupational therapy assessment, plan of care, and goals were reviewed.     Pain:  Intensity Pre-treatment: 1/10   Intensity Post-treatment: 2/10  Scale: Numeric Rating Scale  Location: Back upper back on left  side  Quality: Throbbing  Intervention(s): Nurse notified, Repositioning , and Rest; therapist notified Darel Hong, RN. Staff nurse to follow-up.     SUBJECTIVE:   Patient stated "the shower felt great, I haven't had a shower since January 23rd".    OBJECTIVE DATA SUMMARY:     COGNITION/PERCEPTION Daily Assessment   Overall orientation status Within Normal Limits   Orientation level Oriented X4   Overall cognitive status Exceptions    Alertness Appears intact   Following Commands Appears intact    Safety Judgment Good awareness of safety precautions   Problem Solving Assistance required to generate solutions    Initiation Does not require cues   Sequencing Requires cues for some     THERAPEUTIC ACTIVITY Daily Assessment    Sitting Balance: Modified independent   Standing Balance: Supervision     Functional mobility performed (supv) using FWW; gait belt within patient's room environment during self-care routine. Supervision 2/2 cervical precautions and fatigue level. Pt reporting increased fatigue stating "I worked hard during physical therapy and feel tired."     THERAPEUTIC EXERCISE Daily Assessment    RUE/ LUE AROM (10 reps x3 sets) seated w/c level for shoulder flexion, elbow flexion, supination/ pronation, wrist flexion, and digit range of motion (open/  close). Performed for joint mobility, range of motion, and activity tolerance for carryover to ADL's/ self-cares. Rest break between sets.      EATING Daily Assessment   Functional Level Modified independent    Clinical factors Self-care feeding (Mod I) for meal trays. Pt reports increased time for opening containers, packets, and lids on tray. Pt reports using standard eating utensils using right or left hand. Pt held ensure in right hand to bring up to mouth to take sips from straw.     GROOMING Daily Assessment   Functional Level Setup;Modified independent    Clinical factors  Patient performed oral hygiene (set-up/ Mod I) level seated in w/c at sink with grooming  items set-up at sink side. Pt washed face (set-up/ Mod I) using washcloth seated on tub bench during ADL shower.     UPPER BODY BATHING Daily Assessment   Functional Level Setup   Method Protective barrier;Soap and water   Clinical Factors Patient performed UB bathing (set-up) seated on tub bench during ADL shower; therapist applied waterproof barrier over cervical Aspen collar and wound vac dressing. Pt demonstrating ability to bathe chest, abd, axilla, and BUE's using washcloth.     LOWER BODY BATHING Daily Assessment   Functional Level  Setup;Supervision   Clinical Factors Patient performed LB bathing (set-up/ supv) seated on tub bench and in stance during ADL shower. Pt demonstrating ability to bathe thighs to mid-calf using crossed leg technique using washcloth. Pt used long handle bathe sponge to wash distal LE's and feet. Pt stood (supv) to wash peri area, hips, and buttocks using washcloth. Therapist applied waterproof barrier over cervical Aspen collar and wound vac dressing prior to ADL shower.     SHOWER TRANSFER  Daily Assessment   Shower Transfer Technique Ambulating (stand step xfer)   Chiropodist Comments Stand step xfer performed (supv) using FWW; gait belt to/ from tub transfer bench.     UPPER BODY DRESSING/UNDRESSING Daily Assessment   Functional Level Stand by assistance;Verbal cueing;Increased time to complete;Supervision   Clinical Factors Patient doffed/ donned pullover shirt (supv/ SBA) from seated position. Pt requiring verbal cues for safely managing shirt over Aspen cervical collar; verbal cues for managing shirt over PICO dressing and for placing small PICO drain into pocket on front of shirt.     LOWER BODY DRESSING/UNDRESSING Daily Assessment   Functional Level Setup;Supervision   Clinical Factors Patient performed LB dressing set-up/ supv from seated position and in stance. Pt demonstrating ability to  thread BLE's into underwear and pant legs. Pt stood with supervision to pull pants up over hips to waist. Supervision 2/2 fatigue level.     TOILETING Daily Assessment   Functional Level Setup;Supervision   Clinical factors Toileting performed (set-up/ distant supv) for hygiene performed seated on toilet and for clothing management in stance. Pt voided at this time.     TOILET TRANSFER Daily Assessment   Transfer Technique Stand step   Level of Assistance Supervision   Equipment Standard toilet   Toilet Transfer Comments Functional stand step xfer performed (supv) using FWW to/ from standard toilet; pt voided at this time.     FUNCTIONAL MOBILITY Daily Assessment    Functional - Mobility Device: Rolling Walker  Activity: To/from bathroom  Assist Level: Supervision  Functional Mobility Comments: Functional mobility performed (supv) using FWW; gait belt to/ from bathroom to address self-care needs.   Stand Step Transfers: Supervision  Sit to stand: Supervision  Stand to sit: Supervision  Transfer Comments: Using FWW; gait belt      WHEELCHAIR/BED TRANSFER INDEPENDENCE Daily Assessment   Transfer Technique Stand step   Level of Assistance Supervision   Equipment Wheelchair   Comments Using FWW; gait belt     Activity Tolerance:  Patient Tolerated treatment well;Patient limited by fatigue   PRN rest breaks due to fatigue and decreased endurance   Please refer to the flowsheet for vital signs taken during this treatment.     EDUCATION:   Education Given To: Patient  Education Provided: Role of Therapy;Energy Conservation;Safety;ADL Function;Transfer Training;Mobility Training;Equipment;Fall Prevention Strategies  Education Provided Comments: Pt edu regarding role of OT/ goals, Aspen collar/ cervical precautions, and fall prevention strategies (use of call bell; use of non-skid footwear).  Education Method: Demonstration;Verbal  Barriers to Learning: None  Education Outcome: Verbalized understanding;Demonstrated  understanding    ASSESSMENT:  Patient continues to benefit from skilled occupational therapy intervention to address decreased (I) with ADL's/ self-care, decreased RUE AROM, decreased sensation RUE, cervical precautions/ Aspen collar on at all time/ cervical PICO dressing, pain, impaired dynamic standing tolerance/ balance, and impaired functional mobility limiting patient's functional independence and safety with ADL's/ IADL's and mobility for return to prior level of functioning.   Progression toward goals:  []           Improving appropriately and progressing toward goals  [x]           Improving slowly and progressing toward goals  []           Not making progress toward goals and plan of care will be adjusted      PLAN:  Patient continues to benefit from skilled intervention to address the above impairments.  Continue treatment per established plan of care.  Discharge Recommendations:  Home Health with 24 hr supv/ assist; home occupational therapy  Further Equipment Recommendations for Discharge:  transfer bench  Estimated LOS: TBD     COMMUNICATION/EDUCATION:   []  Home safety education was provided and the patient/caregiver indicated understanding.  [x]  Patient/family have participated as able in goal setting and plan of care.  [x]  Patient/family agree to work toward stated goals and plan of care.  []  Patient understands intent and goals of therapy, but is neutral about his/her participation.  []  Patient is unable to participate in goal setting and plan of care.    Please refer to the flowsheet for vital signs taken during this treatment.  After treatment:   [x]   Patient left in no apparent distress sitting up in chair   []   Patient left in no apparent distress in bed  []   Patient handoff to SLP/PT  [x]   Call bell and immediate needs left within reach  [x]   Nursing notified  []   Caregiver present  []   Bed alarm activated  [x]   Wheelchair alarm activated  []   Boney Prominences offloaded  []   Heels activated for  pressure relief  []   Telesitter/Care companion present    Entered Differentiated Treatment minutes: yes    Dorina Hoyer, OT  01/23/2024

## 2024-01-23 NOTE — Plan of Care (Signed)
Problem: SLP Adult - Disturbed Thought Process  Goal: By Discharge: Demonstrates cognitive skills at highest level of function for planned discharge setting.   See evaluation for individualized goals.  Description: Long term goals (to be met by 2/18)  1. Pt will demonstrate alternating attention to type paragraph length material to dictation w/ 90% accy given minA to facilitate return to work.   2. Pt will perform complex alternating attention tasks w/ 90% accy, independently, to facilitate independence.   3. Pt will perform mental manipulation tasks w/ 90% accy, independently, to facilitate independence.   4. Pt will perform functional problem solving/reasoning tasks w/ 90% accy, independently,  to facilitate safety and independence.    Short term goals (to be met by 2/11)  1. Pt will demonstrate alternating attention to type sentence length material to dictation w/ 90% accy given minA to facilitate return to work.   2. Pt will perform complex alternating attention tasks w/ 80% accy given minA to facilitate independence.   3. Pt will perform mental manipulation tasks w/ 80% accy given minA to facilitate independence.   4. Pt will perform functional problem solving/reasoning tasks w/ 80% accy givenA to facilitate safety and independence.  Outcome: Progressing     Speech LAnguage Pathology evaluation & TREATMENT    Patient: Colleen [name] (63 y.o. female)  Date: 01/23/2024  Primary Diagnosis: Spinal stenosis [M48.00]       Precautions: Standard  PLOF: As per H&P    REASON FOR REFERRAL: Pt was referred for cognitive linguistic evaluation in d/t pt reports of brain fog, slow processing, and increased confusion s/p cervical spinal stenosis. 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 Though, pt scored WNL for standardized testing, she reports that she is not  functioning at baseline. Pt reported issues with sequencing, multitasking, and processing information. Pt is highly motivated to participate in therapy with emphasis on attention & typing to dictation to facilitate return to work (pt is a court room typer/recorder).     Without skilled SLP interventions to address cognitive linguistic deficits, pt is at increased risk of falls, decreased safety, impaired recall of medical information, decreased independence, decreased QOL, inability to return to PLOF, and increased caregiver burden.     Patient will benefit from skilled intervention to address the above impairments.  Patient's rehabilitation potential: Excellent.     Factors which may influence rehabilitation potential include:   []               None noted  [x]               Mental ability/status  []               Medical condition  []               Home/family situation and support systems  []               Safety awareness  []               Pain tolerance/management  []               Other:      PLAN :  Recommendations and Planned Interventions:       Problems to be addressed in therapy:   []  Aphasia  []  Dysarthria  []  Apraxia  []  Oral dysphagia  []  Suspected pharyngeal  dysphagia  [x]  Cognitive linguistic deficits  []  Other:     Frequency/Duration: Patient will be followed by speech-language pathology 1-2 times per day/3-5 days per week to address goals.  Discharge Recommendations for Follow-up: outpatient SLP, home with PRN assist  DME Recommendations: none  Recommended Length of Stay: 3 days     SUBJECTIVE:   Patient stated, "I have brain fog". Pt was engaged and motivated in session.    OBJECTIVE:     Past Medical History:   Diagnosis Date    Asthma     Cardiomyopathy (HCC)     Diabetes (HCC)     HTN (hypertension)      Past Surgical History:   Procedure Laterality Date    BREAST BIOPSY Right     benign more than 30 years ago       Prior Level of Function/Home Situation:  Social/Functional History  Lives With:  Alone  Type of Home: Apartment  Home Layout: One level  Home Access: Stairs to enter with rails  Entrance Stairs - Number of Steps: 14 stairs  with rail on right going up  Entrance Stairs - Rails: Right  Bathroom Shower/Tub: Tub/Shower unit, None  Bathroom Toilet: Midwife: None  Bathroom Accessibility: Not accessible  Home Equipment: None  Has the patient had two or more falls in the past year or any fall with injury in the past year?: Yes  Receives Help From:  (she has 4 children but in [place], has a sister in [place], a sister that lives in [place], and one brother that lives here)  Prior Level of Assist for ADLs: Independent  Prior Level of Assist for [employer]: Independent  Homemaking Responsibilities: Yes  Meal Prep Responsibility: Print production planner Responsibility: Primary  Cleaning Responsibility: Primary  Bill Paying/Finance Responsibility: Primary  Prior Level of Assist for Ambulation: Independent household ambulator, with or without device, Independent community ambulator, with or without device  Prior Level of Assist for Transfers: Independent  Active Driver: Yes  Mode of Transportation: Car  Occupation: Full time employment  Type of Occupation: Teacher, adult education    Daily Assessment:     Orientation  Overall Orientation Status: Within Normal Limits  Neuro-Linguistics:  Attention  Attention: Exceptions to [facility]  Alternating Attention: Mild  Divided Attention: Mild  Selective Attention: Mild  Sustained Attention: WFL  Memory  Memory: Within Functional Limits  Problem Solving  Problem Solving: Exceptions to [facility]  Simple Functional Tasks: Mild  Verbal Reasoning Skills: WFL  Initiation: WFL  Sequencing: Mild  Executive Function Skills: Mild  Managing Finances: Mild  Managing Medications: Mild        Safety/Judgment  Safety/Judgment: Exceptions to [facility]  Complex Functional Tasks: Mild  Novel Situations: Mild  Routine Tasks: Mild  Unable to Self-monitor and Self-correct Consistently: Mild  Insight:  WFL  Impulsive: Mild  Flexibility of Thought: Mild    Cognitive Linguistic Quick Test (CLQT):    Task Score      Personal Facts   8/8   Symbol Cancellation   12/12   Confrontation Naming 10/10   Clock Drawing   13/13   Story Retelling   9/10   Symbol Trails   10/10   Generative Naming   6/9   Design Memory   5/6   Mazes   8/8   Design Generation   4/13   Cognitive Domain Score & Severity Rating   Attention   202  WNL   Memory   166  WNL   Executive Function   28  WNL   Language   33  WNL   Visuospatial Skills   92  WNL   Composite Severity Rating 4.0  WNL   Clock Drawing   13  WNL         SPEECH THERAPY DIAGNOSIS:  mild cognitive linguistic deficits    The severity rating is based on the following outcomes:    Clinical judgment     PAIN:  Pt reports 0/10 pain or discomfort prior to evaluation.  Pt reports 0/10 pain or discomfort post evaluation.     After treatment:   []  Patient left in no apparent distress sitting up in chair  [x]  Patient left in no apparent distress in bed  [x]  Call bell left within reach  [x]   Nursing present  []   Caregiver present  []   Bed alarm activated    COMMUNICATION/EDUCATION:   []  Patient educated regarding compensatory speech/language/comprehension techniques  provided via demonstration, verbalization and teach back of comprehension  []  Patient/family have participated as able in goal setting and plan of care.  []  Patient/family agree to work toward stated goals and plan of care.  []  Patient understands intent and goals of therapy, neutral about participation.  []  Patient unable to participate in goal setting/plan of care secondary to cognition, hearing/vision deficits; education ongoing with interdisciplinary staff     Session 1:   Time in: 1430  Time out: 1530  Minutes: 60    Thank you for this referral.  Gypsy Balsam M.S., [facility] - SLP  Speech Language Pathologist  [facility]

## 2024-01-23 NOTE — Progress Notes (Signed)
[facility]  Team Conference  Date: 01/23/2024  ACTIVE MONITORING OF CO-MORBID CONDITIONS/MANAGEMENT OF NEW MEDICAL ISSUES: Spinal stenosis [M48.00]    **Please refer to patient's electronic medical record to view the care plan and goals**  NURSING Making gains Yes   Skin Care: Skin Integumentary   Skin Condition/Temp: Dry, Warm  Skin Integrity: Incision (see LDA)  Location: posterior neck  Skin Fold Management: No    Skin Concerns: neck incision       Pain: Pain Assessment  Pain Assessment: None - Denies Pain (01/23/24 0745)  Pain Level: 0 (01/23/24 0745)  Patient's Stated Pain Goal: 0 - No pain (01/23/24 0745)  Pain Location: Neck (01/23/24 0745)  Pain Orientation: Posterior (01/23/24 0745)  Pain Descriptors: Aching (01/23/24 0745)  Functional Pain Assessment: Activities are not prevented (01/23/24 0745)  Pain Type: Surgical pain (01/23/24 0745)  Pain Radiating Towards: na (01/22/24 1405)  Pain Frequency: Intermittent (01/22/24 1405)  Pain Onset: On-going (01/22/24 1405)  Non-Pharmaceutical Pain Intervention(s): Rest;Repositioned (01/22/24 1405)  Response to Pain Intervention: Patient satisfied (01/22/24 1505)  Side Effects: No side effects reported or observed (01/22/24 1505)  Multiple Pain Sites: No (01/22/24 1505)    Bladder/Bowel Urine Assessment  Urinary Status: Voiding, Bathroom privileges  Urinary Incontinence: Absent  Urine Color: Yellow/straw  Urine Appearance: Clear  Urine Odor: No odor Stool Assessment  Last BM (including prior to admit): 01/21/24   Goal: Keep incision free from infection      Barrier: wound vac present       Intervention: keep dressing clean and dry       Lab value concerns   Yes       Occupational Therapy  Making gains  Yes   Goal: Pt will preform toileting at mod I       Barrier: increased fatigue, decreased endurance       Intervention: strengthening, adl training       ADL Accessibility Limitations:none        Physical Therapy Making gains Yes   Goal:  Pt will go up and down 14 steps with right handrail at mod I       Barrier: decreased strength      Intervention: stair training, Oncologist Limitations: none         Speech Therapy Making gains Yes   Goal: Pt will complete evaluation       Barrier: new order       Intervention: complete eval as scheduled         Week 1 Therapy Minutes Density Met: Yes  Total minutes week to date: Day 4 370 mins/ 900 mins      Therapy Minutes Not Met Action/Justification:   []  Pt on medical hold  []  Pt refusing therapy despite encouragement and education on benefits of therapy  []  Pt displays decreased tolerance to therapy  []  Other   Action Plan to Make Up Minutes: n/a    CMG Date: 01/28/2024  Estimated Discharge Date: 01/26/2024  [x] Rehabilitation goals from IPOC/Treatment plan reviewed  [x] No changes identified  [] Goal(s) changed:     Patient needs identified [x] Caregiver Education at discharge  [] Caregiver Conference         Recommended Discharge Plan [] home alone   [x] home alone with assist prn    [] Home with continuous supervision       [] Home with continuous assistance   []  Assisted living                     []   SNF   Outpatient pt, slp vs Home Health pt, ot, slp     CURRENT EQUIPMENT NEEDS:  [x] Handicap Placard Application    Equipment needed at discharge: Rolling walker    ADL Equipment:Tub bench    Plan/Adjustments to Plan   [x] Medical conditions exist that require a minimum of 3 times/week physician      oversight and 24-hour rehabilitation nursing to manage/progress the plan of care   [x] Functional deficits require intensive and coordinated therapies to achieve   goals outlined in plan of care   [x] 3 hours therapy 5 days/week OR 15 hours therapy over 7 days   [x] Continued plan of care as patient is showing progress and /or has an expectation to benefit    Team Focus:  none     Patient's plan of care has been reviewed and/or adjusted. Continue treatment as outlined.   I have led this  Team Conference and agree with the plan, Jonah Blue, MD, 01/23/2024, 9:48 AM      Team Conference Recorder Ashley Royalty  Date 01/23/2024    Team members participating in today's conference.   [x]  Rosario Adie, PT     [x]  Bronson Curb, PT           []     Ernst Breach, SLP       [x]  Gean Birchwood, OT            [x]   Gypsy Balsam, SLP             [x]   Adam Phenix, RN   [x]  Ashley Royalty, SW       []  Matthew Folks, PT           []  Other:  [x]  RN: Milus Height, RN   []  Allena Napoleon, OT           []  Lesle Reek, NP

## 2024-01-23 NOTE — Progress Notes (Signed)
4 Eyes Skin Assessment     NAME:  Colleen [name]  DATE OF BIRTH:  01-02-1961  MEDICAL RECORD NUMBER:  [id number]    The patient is being assessed for  Shift Handoff    I agree that at least one RN has performed a thorough Head to Toe Skin Assessment on the patient. ALL assessment sites listed below have been assessed.      Areas assessed by both nurses:    Sacrum. Buttock, Coccyx, Ischium        Does the Patient have a Wound? Yes wound(s) were present on assessment. LDA wound assessment was Initiated and completed by RN       Braden Prevention initiated by RN: Yes  Wound Care Orders initiated by RN: No    Pressure Injury (Stage 3,4, Unstageable, DTI, NWPT, and Complex wounds) if present, place Wound referral order by RN under ORDER ENTRY: No    New Ostomies, if present place, Ostomy referral order under ORDER ENTRY: No     Nurse 1 eSignature: Electronically signed by Milus Height, RN on 01/23/24 at 4:41 PM EST    **SHARE this note so that the co-signing nurse can place an eSignature**    Nurse 2 eSignature: Electronically signed by Lucretia Field, RN on 01/23/24 at 8:31 PM EST

## 2024-01-23 NOTE — Progress Notes (Signed)
4 Eyes Skin Assessment     NAME:  Colleen [name]  DATE OF BIRTH:  1961-01-08  MEDICAL RECORD NUMBER:  [id number]    The patient is being assessed for  Shift Handoff    I agree that at least one RN has performed a thorough Head to Toe Skin Assessment on the patient. ALL assessment sites listed below have been assessed.      Areas assessed by both nurses:    Sacrum. Buttock, Coccyx, Ischium and Legs. Feet and Heels        Does the Patient have a Wound? Yes wound(s) were present on assessment. LDA wound assessment was Initiated and completed by RN       Braden Prevention initiated by RN: Yes  Wound Care Orders initiated by RN: Yes    Pressure Injury (Stage 3,4, Unstageable, DTI, NWPT, and Complex wounds) if present, place Wound referral order by RN under ORDER ENTRY: No    New Ostomies, if present place, Ostomy referral order under ORDER ENTRY: No     Nurse 1 eSignature: Electronically signed by Lucretia Field, RN on 01/23/24 at 7:33 AM EST    **SHARE this note so that the co-signing nurse can place an eSignature**    Nurse 2 eSignature: Electronically signed by Milus Height, RN on 01/23/24 at 4:33 PM EST

## 2024-01-23 NOTE — Progress Notes (Addendum)
NP reached out to [facility] neurosurgery clinic and spoke with clinic medical assistant.  Call was in regards to wound care/dressings after [facility] O dressing is removed within the next few days.  Medical assistant will reach out to Dr. Deberah Pelton, once he arrives in the clinic and get NP a call back with instructions.    Update @ 1:35 PM: NP received call back from Dr. Grant Fontana office.  Orders are once PICC line dressing is removed on day 7, applied sterile Steri-Strips, 4 x 4 gauze, and tape.      Lesle Reek DNP, NP-BC

## 2024-01-23 NOTE — Progress Notes (Signed)
[facility]  [address], [place], [place] [address]     INPATIENT REHABILITATION  DAILY PROGRESS NOTE     Date: 01/23/2024    Name: Colleen [name] Age / Sex: 63 y.o. / female   CSN: [id number] MRN: [id number]   Admit Date: 01/19/2024 Length of Stay: 4 days     Primary Rehabilitation Diagnosis   Impaired mobility and ADLs in the setting of cervical spinal stenosis and radiculopathy, status post C3-T2 fusion and C4-6 laminectomy and C3 and C7 dome laminectomy      Subjective:     I personally saw and evaluated this patient face-to-face today.  Patient is sitting in bed in no apparent distress, awake and alert.  Patient is very pleased with the progress she is making in the inpatient rehab    Objective:     Vital Signs:  Patient Vitals for the past 24 hrs:   BP Temp Temp src Pulse Resp SpO2   01/23/24 1630 (!) 146/77 99.2 F (37.3 C) Oral 68 16 99 %   01/23/24 0715 (!) 99/54 98.9 F (37.2 C) Oral 65 17 96 %   01/22/24 1930 (!) 111/54 98.9 F (37.2 C) Oral 67 16 98 %        Physical Examination:  General:  Awake, alert  Cardiovascular:  S1S2+, RRR  Pulmonary:  CTA b/l  GI:  Soft, BS+, NT, ND  Extremities:  No edema  Has right upper extremity weakness    Current Medications:  Current Facility-Administered Medications   Medication Dose Route Frequency    albuterol (PROVENTIL) (2.5 MG/3ML) 0.083% nebulizer solution 2.5 mg  2.5 mg Nebulization Q4H PRN    aspirin EC tablet 81 mg  81 mg Oral Daily    atorvastatin (LIPITOR) tablet 40 mg  40 mg Oral Daily    busPIRone (BUSPAR) tablet 15 mg  15 mg Oral Daily    budesonide (PULMICORT) nebulizer suspension 1,000 mcg  1 mg Nebulization BID RT    gabapentin (NEURONTIN) capsule 300 mg  300 mg Oral Nightly    losartan (COZAAR) tablet 25 mg  25 mg Oral Daily    hydrALAZINE (APRESOLINE) tablet 10 mg  10 mg Oral Q6H PRN    nitroGLYCERIN (NITROSTAT) SL tablet 0.4 mg  0.4 mg SubLINGual Q5 Min PRN    sertraline (ZOLOFT) tablet 50 mg  50 mg Oral Daily    oyster  shell calcium w/D 500-5 MG-MCG tablet 1 tablet  1 tablet Oral BID    insulin glargine (LANTUS) injection vial 14 Units  14 Units SubCUTAneous Nightly    polyethylene glycol (GLYCOLAX) packet 17 g  17 g Oral Daily    pantoprazole (PROTONIX) tablet 40 mg  40 mg Oral QAM AC    lamoTRIgine (LAMICTAL) tablet 25 mg  25 mg Oral BID    oxyCODONE (ROXICODONE) immediate release tablet 5 mg  5 mg Oral Q6H PRN    naloxone (NARCAN) injection 0.4 mg  0.4 mg IntraVENous PRN    methocarbamol (ROBAXIN) tablet 500 mg  500 mg Oral Q6H PRN    glucose chewable tablet 16 g  4 tablet Oral PRN    dextrose bolus 10% 125 mL  125 mL IntraVENous PRN    Or    dextrose bolus 10% 250 mL  250 mL IntraVENous PRN    glucagon emergency SOLR 1 mg  1 mg SubCUTAneous PRN    dextrose 10 % infusion   IntraVENous Continuous PRN    acetaminophen (TYLENOL) tablet  650 mg  650 mg Oral Q4H PRN    bisacodyl (DULCOLAX) EC tablet 5 mg  5 mg Oral Daily PRN    insulin lispro (HUMALOG,ADMELOG) injection vial 0-4 Units  0-4 Units SubCUTAneous 4x Daily AC & HS       Allergies:  Allergies   Allergen Reactions    Amoxicillin Itching, Rash and Other (See Comments)     89may07 mss RASH AND BREAKOUT...[phone]       Lab/Data Review:  Recent Results (from the past 24 hour(s))   POCT Glucose    Collection Time: 01/22/24  8:43 PM   Result Value Ref Range    POC Glucose 211 (H) 70 - 110 mg/dL   Basic Metabolic Panel    Collection Time: 01/23/24  4:39 AM   Result Value Ref Range    Sodium 141 136 - 145 mmol/L    Potassium 3.9 3.5 - 5.5 mmol/L    Chloride 109 100 - 111 mmol/L    CO2 27 21 - 32 mmol/L    Anion Gap 5 3.0 - 18 mmol/L    Glucose 169 (H) 74 - 99 mg/dL    BUN 22 (H) 7.0 - 18 MG/DL    Creatinine 9.60 (L) 0.6 - 1.3 MG/DL    BUN/Creatinine Ratio 39 (H) 12 - 20      Est, Glom Filt Rate >90 >60 ml/min/1.39m2    Calcium 9.2 8.5 - 10.1 MG/DL   CBC with Auto Differential    Collection Time: 01/23/24  4:39 AM   Result Value Ref Range    WBC 11.8 4.6 - 13.2 K/uL    RBC  3.70 (L) 4.20 - 5.30 M/uL    Hemoglobin 10.1 (L) 12.0 - 16.0 g/dL    Hematocrit 45.4 (L) 35.0 - 45.0 %    MCV 87.3 78.0 - 100.0 FL    MCH 27.3 24.0 - 34.0 PG    MCHC 31.3 31.0 - 37.0 g/dL    RDW 09.8 11.9 - 14.7 %    Platelets 248 135 - 420 K/uL    MPV 10.7 9.2 - 11.8 FL    Nucleated RBCs 0.0 0 PER 100 WBC    nRBC 0.00 0.00 - 0.01 K/uL    Neutrophils % 58.6 40.0 - 73.0 %    Lymphocytes % 33.1 21.0 - 52.0 %    Monocytes % 5.9 3.0 - 10.0 %    Eosinophils % 1.9 0.0 - 5.0 %    Basophils % 0.2 0.0 - 2.0 %    Immature Granulocytes % 0.3 0.0 - 0.5 %    Neutrophils Absolute 6.92 1.80 - 8.00 K/UL    Lymphocytes Absolute 3.90 (H) 0.90 - 3.60 K/UL    Monocytes Absolute 0.70 0.05 - 1.20 K/UL    Eosinophils Absolute 0.22 0.00 - 0.40 K/UL    Basophils Absolute 0.02 0.00 - 0.10 K/UL    Immature Granulocytes Absolute 0.03 0.00 - 0.04 K/UL    Differential Type AUTOMATED     POCT Glucose    Collection Time: 01/23/24  7:20 AM   Result Value Ref Range    POC Glucose 209 (H) 70 - 110 mg/dL   POCT Glucose    Collection Time: 01/23/24 11:37 AM   Result Value Ref Range    POC Glucose 173 (H) 70 - 110 mg/dL   POCT Glucose    Collection Time: 01/23/24  4:25 PM   Result Value Ref Range    POC Glucose 188 (H) 70 -  110 mg/dL         Assessment:     Primary Rehabilitation Diagnosis   Impaired mobility and ADLs in the setting of cervical spinal stenosis and radiculopathy, status post C3-T2 fusion and C4-6 laminectomy and C3 and C7 dome laminectomy     Other Co-Morbid Conditions managed in Rehab      Type 2 diabetes  Hypertension  Dyslipidemia  Anxiety and depression  GERD  Leukocytosis  MEDICAL PLAN:     Impaired mobility and ADLs in the setting of cervical spinal stenosis and radiculopathy, status post C3-T2 fusion and C4-6 laminectomy and C3 and C7 dome laminectomy  Physical therapy and Occupational Therapy  Keep hard cervical collar on at all times  Judicious pain control  Monitor wound     Type 2 diabetes  Continue Lantus 14 units at  bedtime  Monitor sugars ACHS  Sliding scale insulin     Hypertension  Continue losartan 50 mg daily  As needed hydralazine  Monitor blood pressure     Dyslipidemia  Continue atorvastatin 40 mg daily     Anxiety and depression  Continue BuSpar 15 mg daily  Continue Zoloft 50 mg daily     GERD  Continue Protonix     Leukocytosis which appears to be improving.  Patient is afebrile and nontoxic.  No history of any cough fever or chills.  Patient denies any nausea or abdominal pain or diarrhea  Monitor intermittently    01/22/24-continue physical therapy and Occupational Therapy.  Monitor wound.  Continue Lantus and sliding scale insulin.  Monitor blood pressure.  Check CBC in the morning.  I discussed care plan with the patient    01/23/24-continue PT OT.  Patient has pico dressing.  Please see APC note regarding conversation with patient's spine surgical team.  Continue Lantus and sliding scale.  Monitor blood pressure.  I discussed care plan with the patient.  Labs reviewed.  Leukocytosis has resolved.  I discussed with the social worker and the therapist    There is no height or weight on file to calculate BMI.      Functional Progress:    PHYSICAL THERAPY    ON ADMISSION MOST RECENT   Balance  Balance  Posture: Fair  Sitting - Static: Fair, +  Sitting - Dynamic: Fair  Standing - Static: Fair, -  Standing - Dynamic: Poor, +  Fair  Fair;+  Fair  Fair;-  Poor;+      Balance  Balance  Posture: Fair  Sitting - Static: Fair, +  Sitting - Dynamic: Fair  Standing - Static: Fair, -  Standing - Dynamic: Poor, +  Posture: Fair  Sitting - Static: Fair;+  Sitting - Dynamic: Fair  Standing - Static: Fair;-  Standing - Dynamic: Poor;+        Bed Mobility  Stand by assistance  Stand by assistance  Contact guard assistance  Contact guard assistance  Contact guard assistance  Contact guard assistance  Contact guard assistance Bed Mobility  Rolling to Right: Supervision  Rolling to Left: Supervision  Supine to Sit: Supervision  Sit to  Stand: Stand by assistance, Supervision  Sit to Supine: Contact guard assistance  Bed to Chair: Stand by assistance  Car Transfer: Supervision (verbal cueing for sequencing and hand placement getting out of the car)   Insurance risk surveyor: No         Fish farm manager guard  assistance  Level tile  65 feet Ambulation  Device: Rolling Walker  Assistance: Supervision, Modified Independent  Surface: Level tile  Distance: 450 feet; 155 feet x2   Stairs  Yes  Bilateral  Contact guard assistance Stairs  Stairs?: Yes  Rails: Right ascending  Assistance: Stand by assistance, Contact guard assistance (SBA ascending and CGA descending with step-to-step sequencing)     Functional Progress:    OCCUPATIONAL THERAPY    ON ADMISSION MOST RECENT   Eating  Supervision Eating  Feeding: Modified independent    Grooming  Setup Grooming  Grooming: Setup, Modified independent    Bathing  UB Bathing Stand by assistance  LB Bathing Stand by assistance Bathing  UB Bathing   UE Bathing: Setup  LB Bathing   ZOXW([id number])@   Upper Body Dressing  Minimal assistance   Upper Body Dressing  UE Dressing: Stand by assistance, Verbal cueing, Increased time to complete, Supervision   Lower Body Dressing  Stand by assistance Lower Body Dressing  LE Dressing: Setup, Supervision   Toileting  Stand by assistance Toileting  Toileting: Setup, Supervision   Toilet Transfers  Stand by assistance Toilet Transfers  Toilet Transfer: Supervision   Tub Transfers  Tub Transfers: Not tested  [employer] - Transfer From: [employer] - Transfer Type: To and From  Shower - Transfer To: Advertising account planner - Technique: Ambulating (stand step xfer)  Shower Transfers: Not tested  [employer] Comments: Stand step xfer performed (supv) using FWW; gait belt to/ from tub transfer bench. Tub Transfers  Tub Transfers  Tub Transfers: Not tested  SHOWER Transfers  Tub Transfers  Tub  Transfers: Not tested     Legend:   7 - Independent   6 - Modified Independent   5 - Standby Assistance / Supervision / Set-up   4 - Minimum Assistance / Contact Guard Assistance   3 - Moderate Assistance   2 - Maximum Assistance   1 - Total Assistance / Dependent             Plan:     1. Justification for continued stay: Good progression towards established rehabilitation goals.    2. Medical Issues being followed closely:    [x]   Fall and safety precautions     [x]   Wound Care     [x]   Bowel and Bladder Function     [x]   Fluid Electrolyte and Nutrition Balance     [x]   Pain Control      3. Issues that 24 hour rehabilitation nursing is following:    [x]   Fall and safety precautions     [x]   Wound Care     [x]   Bowel and Bladder Function     [x]   Fluid Electrolyte and Nutrition Balance     [x]   Pain Control      [x]   Assistance with and education on in-room safety with transfers to and from the bed, wheelchair, toilet and shower.      4. Acute rehabilitation plan of care:    [x]   Continue current care and rehab.           [x]   Physical Therapy           [x]   Occupational Therapy           []   Speech Therapy      []   Hold Rehab until further notice     5. Medications:    [x]   MAR Reviewed     [x]   Continue Present Medications       6. Chemical DVT Prophylaxis:      []   Enoxaparin     []   Unfractionated Heparin     []   Warfarin     []   NOAC     []   Aspirin     [x]   None     7. Mechanical DVT Prophylaxis:      [x]   TED Stockings     [x]   Sequential Compression Device     []   None     8. GI Prophylaxis:      [x]   PPI     []   H2 Blocker     []   None / Not indicated     9. Code status:Full     Dragon medical dictation software was used for portions of this report. Unintended errors may occur.      Signed:    Jonah Blue, MD      January 23, 2024

## 2024-01-23 NOTE — Plan of Care (Signed)
Problem: Chronic Conditions and Co-morbidities  Goal: Patient's chronic conditions and co-morbidity symptoms are monitored and maintained or improved  Outcome: Progressing     Problem: Pain  Goal: Verbalizes/displays adequate comfort level or baseline comfort level  Outcome: Progressing     Problem: Skin/Tissue Integrity - Adult  Goal: Skin integrity remains intact  Outcome: Progressing     Problem: Safety - Adult  Goal: Free from fall injury  Outcome: Progressing

## 2024-01-23 NOTE — Plan of Care (Signed)
Problem: Physical Therapy - Adult  Goal: By Discharge: Performs mobility at highest level of function for planned discharge setting.  See evaluation for individualized goals.  Description: Physical Therapy Short Term Goals  Initiated 01/20/2024 and to be accomplished within 7 day(s)  1.  Patient will supine <> sit  in bed with supervision/set-up to decrease risk of skin break.  2.  Patient will transfer from bed to chair and chair to bed with SBA using the least restrictive device in prep for sitting EOB ADL's.  3.  Patient will perform sit <> stand with SBA with FWW </= 50% verbal cues for task sequencing in prep for gait.     4.  Patient will ambulate with CGA </= 150 feet with the least restrictive device to progress towards independence.  5.  Patient will ascend/descend >/=8 stairs with B handrail(s) with CGA to progress toward home entrance/exit.     Physical Therapy Long Term Goals  Initiated 01/20/2024 and to be accomplished within 2 weeks  1.  Patient will perform all bed mobility task with independence to return to PLOF.    2.  Patient will transfer from bed to chair and chair to bed with independence using the least restrictive device to return to PLOF.   3.  Patient will perform sit <> stand with modified independence in prep for gait.  4.  Patient will ambulate with modified independence >/=  300 feet with the least restrictive device for safe home negotiation.    5.  Patient will ascend/descend 14 stairs with B handrail(s) with supervision/set-up for entrance/exit to home environment.     Outcome: Progressing     PHYSICAL THERAPY TREATMENT    Patient: Colleen [name] (63 y.o. female)  Date: 01/23/2024  Diagnosis: Spinal stenosis [M48.00]   Precautions: No lifting >10 pounds; fall risk  Chart, physical therapy assessment, plan of care and goals were reviewed.    Time in: 0730  Time out : 0900    Patient seen for: gait training, transfer training, bed mobility,     Pain:  Pt pain was reported as 2  pre-treatment.  Pt pain was reported as 1 post-treatment.  Intervention: Darel Hong, RN administered Tylenol before starting PT treatment.    Patient identified with name and DOB: Yes    SUBJECTIVE:      Patient reports it was very hot in her room last night and maintenance came to fix it and it was great and she slept very well.    OBJECTIVE DATA SUMMARY:    Objective:   Education: Education Given To: Patient  Education Provided: Home Exercise Program;Safety;Mobility Art gallery manager;Fall Prevention Strategies;Energy Conservation  Education Method: Demonstration;Verbal  Education Outcome: Verbalized understanding;Demonstrated understanding  BED/MAT MOBILITY Daily Assessment    Rolling Right  NT    Rolling Left  NT    Supine to Sit Supervision    Sit to Supine  NT      TRANSFERS Daily Assessment    Sit to Stand Stand by assistance;Supervision    Transfer Assist Score Stand by assistance  Stand by assistance          Comments  Patient pushing up on legs to stand    Car Transfer Supervision (verbal cueing for sequencing and hand placement getting out of the car)    Car Type Car simulator         GAIT Daily Assessment    Gait Deviations Slow Cadence;Decreased step length;Decreased head and trunk rotation;Shuffles    Assistive device  Rolling Walker    Ambulation assistance - surface  Supervision;Modified Independent  Level tile    Distance 450 feet; 155 feet x2    Comments Patient very focused on posture and gait pattern    Ambulation-uneven surface  level tile;outdoors;ramp;uneven  Contact guard assistance;Stand by assistance  >350 feet        STEPS/STAIRS Daily Assessment     Steps/Stairs ambulated 12  6"    Assistance Required Stand by assistance;Contact guard assistance (SBA ascending and CGA descending with step-to-step sequencing)    Rail Use Right ascending      BALANCE Daily Assessment    Posture Fair    Sitting - Static Fair;+    Sitting - Dynamic Fair    Standing - Static Fair;-    Standing - Dynamic  Poor;+    Comments  N/A      WHEELCHAIR MOBILITY/MANAGEMENT Daily Assessment   Able to Propel (room>gym)   Assist Level Supervision   Curbs/ramps assistance required  N/A   Wheelchair management  Vc to lock brakes    Comments Verbal cues for course direction      THERAPEUTIC EXERCISES Daily Assessment    Sci Fit B UE/LE L2 x15 min for increased strength and endurance.          ASSESSMENT:  Patient tolerated today's session well and is making great progress towards goals.  Initially patient required supervision with ambulation however throughout visit progressed to modified independent with RW demonstrating good safety awareness. Patient could benefit from continued skilled PT to address decreased strength and improve balance to allow patient to return home safely.   Progression toward goals:  [x]       Improving appropriately and progressing toward goals  []       Improving slowly and progressing toward goals  []       Not making progress toward goals and plan of care will be adjusted      PLAN:  Patient continues to benefit from skilled intervention to address the above impairments.  Continue treatment per established plan of care.  Discharge Recommendations:  Home with Home health PT;Home with assist PRN  Further Equipment Recommendations for Discharge:        Rolling             Estimated Discharge Date:TBD    Activity Tolerance:   Good  Please refer to the flowsheet for vital signs taken during this treatment.    After treatment:   []  Patient left in no apparent distress in bed  [x]  Patient left in no apparent distress sitting up in chair  []  Patient left in no apparent distress in w/c mobilizing under own power  []  Patient left in no apparent distress dining area  []  Patient left in no apparent distress mobilizing under own power  [x]  Call bell left within reach  []  Nursing notified  []  Caregiver present  []  Bed alarm activated   []  Chair alarm activated        Armstead Peaks, PTA  01/23/2024

## 2024-01-24 LAB — POCT GLUCOSE
POC Glucose: 225 mg/dL — ABNORMAL HIGH (ref 70–110)
POC Glucose: 157 mg/dL — ABNORMAL HIGH (ref 70–110)
POC Glucose: 170 mg/dL — ABNORMAL HIGH (ref 70–110)
POC Glucose: 174 mg/dL — ABNORMAL HIGH (ref 70–110)

## 2024-01-24 MED FILL — ACETAMINOPHEN 325 MG PO TABS: 325 MG | ORAL | Qty: 2

## 2024-01-24 MED FILL — BUDESONIDE 0.5 MG/2ML IN SUSP: 0.52 MG/2ML | RESPIRATORY_TRACT | Qty: 2

## 2024-01-24 MED FILL — OYSTER SHELL CALCIUM W/D 500-5 MG-MCG PO TABS: 500-5- MG-MCG | ORAL | Qty: 1

## 2024-01-24 MED FILL — LANTUS 100 UNIT/ML SC SOLN: 100 UNIT/ML | SUBCUTANEOUS | Qty: 14

## 2024-01-24 MED FILL — LAMOTRIGINE 25 MG PO TABS: 25 MG | ORAL | Qty: 1

## 2024-01-24 MED FILL — ATORVASTATIN CALCIUM 40 MG PO TABS: 40 MG | ORAL | Qty: 1

## 2024-01-24 MED FILL — HUMALOG 100 UNIT/ML IJ SOLN: 100 UNIT/ML | INTRAMUSCULAR | Qty: 1

## 2024-01-24 MED FILL — BUSPIRONE HCL 5 MG PO TABS: 5 MG | ORAL | Qty: 3

## 2024-01-24 MED FILL — GABAPENTIN 300 MG PO CAPS: 300 MG | ORAL | Qty: 1

## 2024-01-24 MED FILL — POLYETHYLENE GLYCOL 3350 17 G PO PACK: 17 g | ORAL | Qty: 1

## 2024-01-24 MED FILL — BAYER LOW DOSE 81 MG PO TBEC: 81 MG | ORAL | Qty: 1

## 2024-01-24 MED FILL — PANTOPRAZOLE SODIUM 40 MG PO TBEC: 40 MG | ORAL | Qty: 1

## 2024-01-24 MED FILL — LOSARTAN POTASSIUM 25 MG PO TABS: 25 MG | ORAL | Qty: 1

## 2024-01-24 MED FILL — SERTRALINE HCL 50 MG PO TABS: 50 MG | ORAL | Qty: 1

## 2024-01-24 NOTE — Progress Notes (Signed)
[facility]  [address] [place], [place] [address]     INPATIENT REHABILITATION  DAILY PROGRESS NOTE     Date: 01/24/2024    Name: Colleen [name] Age / Sex: 63 y.o. / female   CSN: [id number] MRN: [id number]   Admit Date: 01/19/2024 Length of Stay: 5 days     Primary Rehabilitation Diagnosis   Impaired mobility and ADLs in the setting of cervical spinal stenosis and radiculopathy, status post C3-T2 fusion and C4-6 laminectomy and C3 and C7 dome laminectomy      Subjective:     I personally saw and evaluated this patient face-to-face today.  Patient is lying in bed in no apparent distress.  Patient is in good spirits.  Patient is wearing cervical collar    Objective:     Vital Signs:  Patient Vitals for the past 24 hrs:   BP Temp Temp src Pulse Resp SpO2   01/24/24 1609 130/75 99.4 F (37.4 C) Oral 69 18 96 %   01/24/24 0733 117/68 98.4 F (36.9 C) Oral 65 18 96 %   01/23/24 1950 134/76 98.1 F (36.7 C) Oral 68 16 98 %        Physical Examination:  General:  Awake, alert  Cardiovascular:  S1S2+, RRR  Pulmonary:  CTA b/l  GI:  Soft, BS+, NT, ND  Extremities:  No edema  Right upper extremity weakness    Current Medications:  Current Facility-Administered Medications   Medication Dose Route Frequency    albuterol (PROVENTIL) (2.5 MG/3ML) 0.083% nebulizer solution 2.5 mg  2.5 mg Nebulization Q4H PRN    aspirin EC tablet 81 mg  81 mg Oral Daily    atorvastatin (LIPITOR) tablet 40 mg  40 mg Oral Daily    busPIRone (BUSPAR) tablet 15 mg  15 mg Oral Daily    budesonide (PULMICORT) nebulizer suspension 1,000 mcg  1 mg Nebulization BID RT    gabapentin (NEURONTIN) capsule 300 mg  300 mg Oral Nightly    losartan (COZAAR) tablet 25 mg  25 mg Oral Daily    hydrALAZINE (APRESOLINE) tablet 10 mg  10 mg Oral Q6H PRN    nitroGLYCERIN (NITROSTAT) SL tablet 0.4 mg  0.4 mg SubLINGual Q5 Min PRN    sertraline (ZOLOFT) tablet 50 mg  50 mg Oral Daily    oyster shell calcium w/D 500-5 MG-MCG tablet 1 tablet   1 tablet Oral BID    insulin glargine (LANTUS) injection vial 14 Units  14 Units SubCUTAneous Nightly    polyethylene glycol (GLYCOLAX) packet 17 g  17 g Oral Daily    pantoprazole (PROTONIX) tablet 40 mg  40 mg Oral QAM AC    lamoTRIgine (LAMICTAL) tablet 25 mg  25 mg Oral BID    oxyCODONE (ROXICODONE) immediate release tablet 5 mg  5 mg Oral Q6H PRN    naloxone (NARCAN) injection 0.4 mg  0.4 mg IntraVENous PRN    methocarbamol (ROBAXIN) tablet 500 mg  500 mg Oral Q6H PRN    glucose chewable tablet 16 g  4 tablet Oral PRN    dextrose bolus 10% 125 mL  125 mL IntraVENous PRN    Or    dextrose bolus 10% 250 mL  250 mL IntraVENous PRN    glucagon emergency SOLR 1 mg  1 mg SubCUTAneous PRN    dextrose 10 % infusion   IntraVENous Continuous PRN    acetaminophen (TYLENOL) tablet 650 mg  650 mg Oral Q4H PRN  bisacodyl (DULCOLAX) EC tablet 5 mg  5 mg Oral Daily PRN    insulin lispro (HUMALOG,ADMELOG) injection vial 0-4 Units  0-4 Units SubCUTAneous 4x Daily AC & HS       Allergies:  Allergies   Allergen Reactions    Amoxicillin Itching, Rash and Other (See Comments)     49may07 mss RASH AND BREAKOUT...[phone]       Lab/Data Review:  Recent Results (from the past 24 hour(s))   POCT Glucose    Collection Time: 01/23/24  7:49 PM   Result Value Ref Range    POC Glucose 225 (H) 70 - 110 mg/dL   POCT Glucose    Collection Time: 01/24/24  7:33 AM   Result Value Ref Range    POC Glucose 157 (H) 70 - 110 mg/dL   POCT Glucose    Collection Time: 01/24/24 11:31 AM   Result Value Ref Range    POC Glucose 170 (H) 70 - 110 mg/dL   POCT Glucose    Collection Time: 01/24/24  4:04 PM   Result Value Ref Range    POC Glucose 174 (H) 70 - 110 mg/dL         Assessment:     Primary Rehabilitation Diagnosis   Impaired mobility and ADLs in the setting of cervical spinal stenosis and radiculopathy, status post C3-T2 fusion and C4-6 laminectomy and C3 and C7 dome laminectomy     Other Co-Morbid Conditions managed in Rehab      Type 2  diabetes  Hypertension  Dyslipidemia  Anxiety and depression  GERD  Leukocytosis    MEDICAL PLAN:     Impaired mobility and ADLs in the setting of cervical spinal stenosis and radiculopathy, status post C3-T2 fusion and C4-6 laminectomy and C3 and C7 dome laminectomy  Physical therapy and Occupational Therapy  Keep hard cervical collar on at all times  Judicious pain control  Monitor wound     Type 2 diabetes  Continue Lantus 14 units at bedtime  Monitor sugars ACHS  Sliding scale insulin     Hypertension  Continue losartan 50 mg daily  As needed hydralazine  Monitor blood pressure     Dyslipidemia  Continue atorvastatin 40 mg daily     Anxiety and depression  Continue BuSpar 15 mg daily  Continue Zoloft 50 mg daily     GERD  Continue Protonix     Leukocytosis which appears to be improving.  Patient is afebrile and nontoxic.  No history of any cough fever or chills.  Patient denies any nausea or abdominal pain or diarrhea  Monitor intermittently    01/22/24-continue physical therapy and Occupational Therapy.  Monitor wound.  Continue Lantus and sliding scale insulin.  Monitor blood pressure.  Check CBC in the morning.  I discussed care plan with the patient    01/23/24-continue PT OT.  Patient has pico dressing.  Please see APC note regarding conversation with patient's spine surgical team.  Continue Lantus and sliding scale.  Monitor blood pressure.  I discussed care plan with the patient.  Labs reviewed.  Leukocytosis has resolved.  I discussed with the social worker and the therapist    01/24/24-physical therapy and Occupational Therapy.  Lantus and sliding scale.  I discussed care plan with the patient.  I discussed discharge plans with the patient      There is no height or weight on file to calculate BMI.      Functional Progress:    PHYSICAL  THERAPY    ON ADMISSION MOST RECENT   Balance  Balance  Posture: Fair  Sitting - Static: Good  Sitting - Dynamic: Fair, +  Standing - Static: Fair, -  Standing - Dynamic: Poor,  +  Fair  Good  Fair;+  Fair;-  Poor;+      Balance  Balance  Posture: Fair  Sitting - Static: Good  Sitting - Dynamic: Fair, +  Standing - Static: Fair, -  Standing - Dynamic: Poor, +  Posture: Fair  Sitting - Static: Good  Sitting - Dynamic: Fair;+  Standing - Static: Fair;-  Standing - Dynamic: Poor;+        Bed Mobility  Stand by assistance  Stand by assistance  Contact guard assistance  Contact guard assistance  Contact guard assistance  Contact guard assistance  Contact guard assistance Bed Mobility  Rolling to Right: Supervision  Rolling to Left: Supervision  Supine to Sit: Supervision  Sit to Stand: Modified independent  Sit to Supine: Contact guard assistance  Bed to Chair: Stand by assistance  Car Transfer: Supervision (verbal cueing for sequencing and hand placement getting out of the car)   Wheelchair Mobility  Yes       Wheelchair Mobility  Propulsion: No         Ambulation  Rolling Therapist, occupational guard assistance  Level tile  65 feet Ambulation  Device: Agricultural consultant  Assistance: Modified Independent  Surface: Level tile  Distance: 450 feet   Stairs  Yes  Bilateral  Contact guard assistance Stairs  Stairs?: Yes  Rails: Right ascending  Assistance: Stand by assistance, Supervision     Functional Progress:    OCCUPATIONAL THERAPY    ON ADMISSION MOST RECENT   Eating  Supervision Eating  Feeding: Independent   Grooming  Setup Grooming  Grooming: Setup, Modified independent    Bathing  UB Bathing Stand by assistance  LB Bathing Stand by assistance Bathing  UB Bathing   UE Bathing: Setup  LB Bathing   ZOXW([id number])@   Upper Body Dressing  Minimal assistance   Upper Body Dressing  UE Dressing: Setup   Lower Body Dressing  Stand by assistance Lower Body Dressing  LE Dressing: Setup, Supervision   Toileting  Stand by assistance Toileting  Toileting: Modified independent    Toilet Transfers  Stand by Production designer, theatre/television/film Transfer: Modified independent   Tub Transfers  Tub Transfers: Not  tested  Engineer, manufacturing - Transfer From: Adult nurse - Transfer Type: To and From  Shower - Transfer To: Advertising account planner - Technique: Ambulating (stand step xfer)  Shower Transfers: Not tested  [employer] Comments: Stand step xfer performed (supv) using FWW; gait belt to/ from tub transfer bench. Tub Transfers  Tub Transfers  Tub Transfers: Not tested  SHOWER Transfers  Tub Transfers  Tub Transfers: Not tested     Legend:   7 - Independent   6 - Modified Independent   5 - Standby Assistance / Supervision / Set-up   4 - Minimum Assistance / Contact Guard Assistance   3 - Moderate Assistance   2 - Maximum Assistance   1 - Total Assistance / Dependent             Plan:     1. Justification for continued stay: Good progression towards established rehabilitation goals.    2. Medical Issues being followed closely:    [x]   Fall and safety precautions     [  x]  Wound Care     [x]   Bowel and Bladder Function     [x]   Fluid Electrolyte and Nutrition Balance     [x]   Pain Control      3. Issues that 24 hour rehabilitation nursing is following:    [x]   Fall and safety precautions     [x]   Wound Care     [x]   Bowel and Bladder Function     [x]   Fluid Electrolyte and Nutrition Balance     [x]   Pain Control      [x]   Assistance with and education on in-room safety with transfers to and from the bed, wheelchair, toilet and shower.      4. Acute rehabilitation plan of care:    [x]   Continue current care and rehab.           [x]   Physical Therapy           [x]   Occupational Therapy           []   Speech Therapy      []   Hold Rehab until further notice     5. Medications:    [x]   MAR Reviewed     [x]   Continue Present Medications       6. Chemical DVT Prophylaxis:      []   Enoxaparin     []   Unfractionated Heparin     []   Warfarin     []   NOAC     []   Aspirin     [x]   None     7. Mechanical DVT Prophylaxis:      [x]   TED Stockings     [x]   Sequential Compression Device     []   None     8. GI Prophylaxis:       [x]   PPI     []   H2 Blocker     []   None / Not indicated     9. Code status:Full     Dragon medical dictation software was used for portions of this report. Unintended errors may occur.      Signed:    Jonah Blue, MD      January 24, 2024

## 2024-01-24 NOTE — Progress Notes (Signed)
4 Eyes Skin Assessment     NAME:  Colleen [name]  DATE OF BIRTH:  05-31-1961  MEDICAL RECORD NUMBER:  [id number]    The patient is being assessed for  Shift Handoff    I agree that at least one RN has performed a thorough Head to Toe Skin Assessment on the patient. ALL assessment sites listed below have been assessed.      Areas assessed by both nurses:    Sacrum. Buttock, Coccyx, Ischium        Does the Patient have a Wound? No noted wound(s)       Braden Prevention initiated by RN: Yes  Wound Care Orders initiated by RN: No    Pressure Injury (Stage 3,4, Unstageable, DTI, NWPT, and Complex wounds) if present, place Wound referral order by RN under ORDER ENTRY: No    New Ostomies, if present place, Ostomy referral order under ORDER ENTRY: No     Nurse 1 eSignature: Electronically signed by Octavia Heir, RN on 01/24/24 at 7:02 AM EST    **SHARE this note so that the co-signing nurse can place an eSignature**    Nurse 2 eSignature: Electronically signed by Orvilla Cornwall, LPN on 4/0/98 at 4:57 PM EST

## 2024-01-24 NOTE — Plan of Care (Signed)
Problem: Chronic Conditions and Co-morbidities  Goal: Patient's chronic conditions and co-morbidity symptoms are monitored and maintained or improved  01/24/2024 0755 by Orvilla Cornwall, LPN  Outcome: Progressing  01/24/2024 0014 by Octavia Heir, RN  Outcome: Progressing

## 2024-01-24 NOTE — Plan of Care (Incomplete)
Problem: SLP Adult - Disturbed Thought Process  Goal: By Discharge: Demonstrates cognitive skills at highest level of function for planned discharge setting.   See evaluation for individualized goals.  Description: Long term goals (to be met by 2/18)  1. Pt will demonstrate alternating attention to type paragraph length material to dictation w/ 90% accy given minA to facilitate return to work.   2. Pt will perform complex alternating attention tasks w/ 90% accy, independently, to facilitate independence.   3. Pt will perform mental manipulation tasks w/ 90% accy, independently, to facilitate independence.   4. Pt will perform functional problem solving/reasoning tasks w/ 90% accy, independently,  to facilitate safety and independence.    Short term goals (to be met by 2/11)  1. Pt will demonstrate alternating attention to type sentence length material to dictation w/ 90% accy given minA to facilitate return to work.   2. Pt will perform complex alternating attention tasks w/ 80% accy given minA to facilitate independence.   3. Pt will perform mental manipulation tasks w/ 80% accy given minA to facilitate independence.   4. Pt will perform functional problem solving/reasoning tasks w/ 80% accy givenA to facilitate safety and independence.  Outcome: Progressing

## 2024-01-24 NOTE — Progress Notes (Signed)
TEAM CONFERENCE FOLLOW-UP  Patient: Colleen [name] (63 y.o. female)  Date: 01/24/2024  Diagnosis: Spinal stenosis [M48.00] Spinal stenosis      Precautions:      Met with patient to discuss the findings from 01/23/2024 Team Conference.    Patient requested further information and/or had questions:   No Patient notified that her discharge date is scheduled for Friday, 2/7 and she stated understanding.         Billee Cashing, RN

## 2024-01-24 NOTE — Progress Notes (Signed)
4 Eyes Skin Assessment     NAME:  Colleen [name]  DATE OF BIRTH:  27-Oct-1961  MEDICAL RECORD NUMBER:  [id number]    The patient is being assessed for  Shift Handoff    I agree that at least one RN has performed a thorough Head to Toe Skin Assessment on the patient. ALL assessment sites listed below have been assessed.      Areas assessed by both nurses:    Arms, Elbows, Hands, Sacrum. Buttock, Coccyx, Ischium, and Legs. Feet and Heels        Does the Patient have a Wound? Yes wound(s) were present on assessment. LDA wound assessment was Initiated and completed by RN       Braden Prevention initiated by RN: Yes  Wound Care Orders initiated by RN: No    Pressure Injury (Stage 3,4, Unstageable, DTI, NWPT, and Complex wounds) if present, place Wound referral order by RN under ORDER ENTRY: No    New Ostomies, if present place, Ostomy referral order under ORDER ENTRY: No     Nurse 1 eSignature: Electronically signed by Orvilla Cornwall, LPN on 07/20/94 at 6:13 PM EST    **SHARE this note so that the co-signing nurse can place an eSignature**    Nurse 2 eSignature: Electronically signed by Laverle Hobby, RN on 01/24/24 at 8:26 PM EST

## 2024-01-24 NOTE — Plan of Care (Addendum)
Problem: Occupational Therapy - Adult  Goal: By Discharge: Performs self-care activities at highest level of function for planned discharge setting.  See evaluation for individualized goals.  Description: Occupational Therapy Goals   Long Term Goals  Initiated 01/20/2024 and to be accomplished within 2 week(s) 02/03/2024  1. Pt will perform self-feeding with ModI.  2. Pt will perform grooming with ModI.  3. Pt will perform UB bathing with ModI  4. Pt will perform LB bathing with ModI.  5. Pt will perform tub/shower transfer with ModI.   6. Pt will perform UB dressing with ModI.  7. Pt will perform LB dressing with ModI.  8. Pt will perform toileting task with ModI.  9. Pt will perform toilet transfer with ModI.  10.  Pt will perform an IADL task with good safety and ModI.     Short Term Goals   Initiated 01/20/2024 and to be accomplished within 7 day(s) 01/27/2024  1. Pt will perform self-feeding with S   2. Pt will perform grooming with S.  3. Pt will perform UB bathing with S.  4. Pt will perform LB bathing with S  5. Pt will perform tub/shower transfer with S.  6. Pt will perform UB dressing with S.  7. Pt will perform LB dressing with S  8. Pt will perform toileting task with S.  9. Pt will perform toilet transfer with S.         Outcome: Progressing   Occupational Therapy TREATMENT    Patient: Colleen [name]   63 y.o.    Patient identified with name and DOB: yes    Date: 01/24/2024    First Tx Session  Time In: 0930  Time Out: 1100  Second Tx Session  Time In: 1415  Time Out: 1423    Diagnosis: Spinal stenosis [M48.00]   Precautions: Restrictions/Precautions: General Precautions, Fall Risk  Required Braces or Orthoses?: Yes Spinal Precautions: No Bending, No Lifting, No Twisting  Sternal Precautions: No Pushing, No Pulling, 5# Lifting Restrictions           Aspen collar on at all times; Cervical spinal precautions  Chart, occupational therapy assessment, plan of care, and goals were reviewed.     Pain:  Intensity  Pre-treatment: 0/10   Intensity Post-treatment: 1/10  Scale: Numeric Rating Scale  Location: Neck  Quality: Aching  Intervention(s): Nurse notified, Repositioning , and Rest    SUBJECTIVE:   Patient stated "I will just wash up with the basin."    OBJECTIVE DATA SUMMARY:     COGNITION/PERCEPTION Daily Assessment   Overall orientation status Within Normal Limits   Orientation level Oriented X4   Overall cognitive status Exceptions    Alertness Appears intact   Following Commands Appears intact    Memory  (Verbal cues/ reminders for cervical precautions.)    Safety Judgment  (Pt requiring verbal cues and reminders for maintaining cervical precautions within context of self-care/ ADL's.)   Problem Solving Assistance required to generate solutions    Initiation Requires cues for some   Sequencing Requires cues for some     THERAPEUTIC ACTIVITY Daily Assessment    Sitting Balance: Modified independent   Standing Balance: Supervision     Functional distance ambulation performed (Mod I/ supv) using FWW; gait belt from pt's room to therapy gym and back to room (278 ft x2 with one standing rest break mid-way on return back to patient's room). No LOB noted. Verbal cue for maintaining hand placement on walker and  for cervical precautions. Aspen collar on at all times.      EATING Daily Assessment   Functional Level Independent   Clinical factors Self-feeding (independently) using eating utensil held in left hand to bring loaded eating utensil up to mouth. Pt reports ability to open small packets and containers on tray.     GROOMING Daily Assessment   Functional Level Setup;Modified independent    Clinical factors  Patient washed face (set-up/ Mod I) using washcloth seated edge of bed with basin set-up on tray table. Patient applied deodorant (Mod I) to bilateral axilla.     UPPER BODY BATHING Daily Assessment   Functional Level Setup   Method Soap and water   Clinical Factors Patient performed UB bathing (set-up) seated edge of  bed with basin set-up on tray table. Pt demonstrating ability to bathe chest, abd, axilla, and BUE's using washcloth.     LOWER BODY BATHING Daily Assessment   Functional Level  Supervision;Setup   Clinical Factors Patient performed LB bathing (set-up/ supv) seated edge of bed with basin set-up on tray table. Pt demonstrating ability to bathe thighs to mid-calf using crossed leg technique using washcloth; use of crossed leg technique to wash distal LE's and feet. Pt stood (supv) to wash peri area, hips, and buttocks using washcloth. Pt requiring verbal cues for maintaining cervical precautions (Aspen collar on at all times).     UPPER BODY DRESSING/UNDRESSING Daily Assessment   Functional Level Setup   Clinical Factors Patient doffed/ donned pullover shirt (set-up) from seated position at edge of bed. Pt requiring verbal cue for managing shirt over PICO dressing and for placing small PICO drain into pocket on front of shirt.     LOWER BODY DRESSING/UNDRESSING Daily Assessment   Functional Level Setup;Supervision   Clinical Factors Patient performed LB dressing set-up seated at edge of bed and in stance. Pt demonstrating ability to thread BLE's into pant legs. Pt stood (supv) to pull pants up over hips to waist. Verbal cue and reminder for cervical spinal precautions and location of PICO during LB dressing.     TOILETING Daily Assessment   Functional Level Modified independent    Clinical factors Toileting performed (Mod I) for hygiene performed seated on toilet and for clothing management in stance. Pt voided at this time.    Second tx session: pt toileted Mod I for CM and hygiene; pt voided and had bowel movement at this time.      TOILET TRANSFER Daily Assessment   Transfer Technique Stand step   Level of Assistance Modified independent   Equipment Standard toilet   Toilet Transfer Comments Functional stand step xfer performed (Mod I) using FWW to/ from standard toilet; pt voided at this time.     FUNCTIONAL  MOBILITY Daily Assessment    Stand Step Transfers: Modified independent  Sit to stand: Modified independent  Stand to sit: Modified independent  Transfer Comments: Using FWW; gait belt      WHEELCHAIR/BED TRANSFER INDEPENDENCE Daily Assessment   Transfer Technique Stand step   Level of Assistance Modified independent    Equipment Wheelchair   Comments Using FWW; gait belt     Activity Tolerance:  Patient Tolerated treatment well     Please refer to the flowsheet for vital signs taken during this treatment.     EDUCATION:   Education Given To: Patient  Education Provided: Home Exercise Program;Safety;Mobility Art gallery manager;Fall Prevention Strategies;Energy Conservation  Education Method: Demonstration;Verbal  Barriers to Learning: Other (Comment);None (Verbal cues  and reminders for cervical precautions within context of self-care)  Education Outcome: Verbalized understanding;Demonstrated understanding    ASSESSMENT:  Patient continues to benefit from skilled occupational therapy intervention to address decreased (I) with ADL's/ self-care, decreased RUE AROM, decreased sensation RUE, cervical precautions/ Aspen collar on at all time/ cervical PICO dressing, pain, impaired dynamic standing tolerance/ balance, and impaired functional mobility limiting patient's functional independence and safety with ADL's/ IADL's and mobility for return to prior level of functioning.   Progression toward goals:  [x]           Improving appropriately and progressing toward goals  []           Improving slowly and progressing toward goals  []           Not making progress toward goals and plan of care will be adjusted      PLAN:  Patient continues to benefit from skilled intervention to address the above impairments.  Continue treatment per established plan of care.  Discharge Recommendations:  Home Health with supv; Outpatient occupational therapy  Further Equipment Recommendations for Discharge:  transfer bench  Estimated  LOS: 01/26/2024     COMMUNICATION/EDUCATION:   []  Home safety education was provided and the patient/caregiver indicated understanding.  [x]  Patient/family have participated as able in goal setting and plan of care.  [x]  Patient/family agree to work toward stated goals and plan of care.  []  Patient understands intent and goals of therapy, but is neutral about his/her participation.  []  Patient is unable to participate in goal setting and plan of care.    Please refer to the flowsheet for vital signs taken during this treatment.  After treatment:   []   Patient left in no apparent distress sitting up in chair   [x]   Patient left in no apparent distress in bed with needs met  []   Patient handoff to SLP/PT  [x]   Call bell and immediate needs left within reach  [x]   Nursing notified  []   Caregiver present  [x]   Bed alarm activated  []   Chair alarm activated  []   Boney Prominences offloaded  []   Heels activated for pressure relief  []   Telesitter/Care companion present    Entered Differentiated Treatment minutes: yes    Dorina Hoyer, OT  01/24/2024

## 2024-01-24 NOTE — Plan of Care (Signed)
Problem: SLP Adult - Disturbed Thought Process  Goal: By Discharge: Demonstrates cognitive skills at highest level of function for planned discharge setting.   See evaluation for individualized goals.  Description: Long term goals (to be met by 2/18)  1. Pt will demonstrate alternating attention to type paragraph length material to dictation w/ 90% accy given minA to facilitate return to work.   2. Pt will perform complex alternating attention tasks w/ 90% accy, independently, to facilitate independence.   3. Pt will perform mental manipulation tasks w/ 90% accy, independently, to facilitate independence.   4. Pt will perform functional problem solving/reasoning tasks w/ 90% accy, independently,  to facilitate safety and independence.    Short term goals (to be met by 2/11)  1. Pt will demonstrate alternating attention to type sentence length material to dictation w/ 90% accy given minA to facilitate return to work.   2. Pt will perform complex alternating attention tasks w/ 80% accy given minA to facilitate independence.   3. Pt will perform mental manipulation tasks w/ 80% accy given minA to facilitate independence.   4. Pt will perform functional problem solving/reasoning tasks w/ 80% accy givenA to facilitate safety and independence.    SPEECH-LANGUAGE TREATMENT    Patient: Colleen [name] (63 y.o. female)  Date: 01/24/2024  Diagnosis: Spinal stenosis [M48.00] Spinal stenosis      Precautions: Standard  PLOF: As per H&P     ASSESSMENT:  Pt is progressing toward completing complex executive functioning tasks to facilitate independence and potential return to work.    Progression toward goals:  [x]        Improving appropriately and progressing toward goals  []        Improving slowly and progressing toward goals  []        Not making progress toward goals and plan of care will be adjusted     PLAN:  Patient continues to benefit from skilled intervention to address the above impairments.  Continue treatment per  established plan of care.    Discharge Recommendations for Follow-up: Outpatient; continue SLP services  DME Recommendations: None  Recommended Length of Stay: 2 days     SUBJECTIVE:   Patient stated "This is fun".    OBJECTIVE:   Daily Assessment:  Orientation: x4    Treatment & Interventions:    Neuro-Linguistics:     Cognitive domain Intervention Activity Functional application   Attention (alternating attention) cognitive retraining -Trailmaking based on 1 feature given a sequence x25 items with 100% accy given min verbal cueing  -Trailmaking based on 2 features given a sequence x25 items: 100% accy given min verbal cueing improve independence, improved multitasking     Executive functioning (organization/sequencing) cognitive retraining -(I)ly set up a pill box using hypothetical pills with 100% accy improve independence     Response & Tolerance to Activities:  Good tolerance and participation    PAIN:  Start of Tx: 0/10  End of Tx: 0/10     After treatment:   []        Patient left in no apparent distress sitting up in chair  [x]        Patient left in no apparent distress in bed  [x]        Call bell left within reach  []        Nursing notified  []        Caregiver present  [x]        Bed alarm activated    COMMUNICATION/EDUCATION:   [x]  Patient  educated regarding compensatory speech/language/comprehension techniques provided via demonstration, verbalization and teach back of comprehension  []  Patient/family have participated as able in goal setting and plan of care.  []  Patient/family agree to work toward stated goals and plan of care.  []  Patient understands intent and goals of therapy, neutral about participation.  []  Patient unable to participate in goal setting/plan of care secondary to cognition, hearing/vision deficits; education ongoing with interdisciplinary staff     Session 1:   Time in: 1630  Time out: 1700  Minutes: 30    This session was conducted under the direct supervision of clinician, Gypsy Balsam M.S., CCC-SLP.     Merita Norton, Senaida Lange  SLP Student Clinician

## 2024-01-24 NOTE — Plan of Care (Signed)
Problem: Physical Therapy - Adult  Goal: By Discharge: Performs mobility at highest level of function for planned discharge setting.  See evaluation for individualized goals.  Description: Physical Therapy Short Term Goals  Initiated 01/20/2024 and to be accomplished within 7 day(s)  1.  Patient will supine <> sit  in bed with supervision/set-up to decrease risk of skin break.  2.  Patient will transfer from bed to chair and chair to bed with SBA using the least restrictive device in prep for sitting EOB ADL's.  3.  Patient will perform sit <> stand with SBA with FWW </= 50% verbal cues for task sequencing in prep for gait.     4.  Patient will ambulate with CGA </= 150 feet with the least restrictive device to progress towards independence.  5.  Patient will ascend/descend >/=8 stairs with B handrail(s) with CGA to progress toward home entrance/exit.     Physical Therapy Long Term Goals  Initiated 01/20/2024 and to be accomplished within 2 weeks  1.  Patient will perform all bed mobility task with independence to return to PLOF.    2.  Patient will transfer from bed to chair and chair to bed with independence using the least restrictive device to return to PLOF.   3.  Patient will perform sit <> stand with modified independence in prep for gait.  4.  Patient will ambulate with modified independence >/=  300 feet with the least restrictive device for safe home negotiation.    5.  Patient will ascend/descend 14 stairs with B handrail(s) with supervision/set-up for entrance/exit to home environment.     Outcome: Progressing     PHYSICAL THERAPY TREATMENT    Patient: Colleen [name] (63 y.o. female)  Date: 01/24/2024  Diagnosis: Spinal stenosis [M48.00]   Precautions: fall risk, no lifting > 10#  Chart, physical therapy assessment, plan of care and goals were reviewed.    Time in: 1300  Time out : 1430    Patient seen for: gait training, transfer training, therapeutic activities, strengthening, and patient  education    Pain:  Pt pain was reported as 1 pre-treatment.  Pt pain was reported as 2 post-treatment.  Intervention: Patient reports taking Tylenol this morning, denies needing to take it again before PT session.    Patient identified with name and DOB: Yes    SUBJECTIVE:      "I am going home Friday and I am excited."    OBJECTIVE DATA SUMMARY:    Objective:   Education: Education Given To: Patient  Education Provided: Home Exercise Program;Safety;Mobility Art gallery manager;Fall Prevention Strategies;Energy Conservation  Education Method: Demonstration;Verbal  Barriers to Learning: Other (Comment);None (Verbal cues and reminders for cervical precautions within context of self-care)  Education Outcome: Verbalized understanding;Demonstrated understanding    TRANSFERS Daily Assessment    Sit to Stand Modified independent    Transfer Assist Score  Modified independent               GAIT Daily Assessment    Gait Deviations Slow Cadence;Decreased step length;Decreased head and trunk rotation    Assistive device Rolling Walker    Ambulation assistance - surface  Modified Independent  Level tile    Distance 450 feet    Comments 155 feet x2 without device and SBA for safety      STEPS/STAIRS Daily Assessment     Steps/Stairs ambulated 12  6"    Assistance Required Stand by assistance;Supervision    Rail Use Right ascending  BALANCE Daily Assessment    Posture Fair    Sitting - Static Good    Sitting - Dynamic Fair;+    Standing - Static Fair;-    Standing - Dynamic Poor;+      WHEELCHAIR MOBILITY/MANAGEMENT Daily Assessment   Able to Propel 278 feet x2 (room<>gym)   Assist Level Modified independent     THERAPEUTIC EXERCISES Daily Assessment    -Standing with B UE support and 2# B LE heel raises, toe raises, alternating marching, and squats 3x10 each.  Hip x3 way with 2# B LE x10 each.  -Sci Fit B LE L3 x18 min for increased strength and endurance.  Gave patient written HEP and reviewed all exercises and  patient was able to return correct direct demonstration .         ASSESSMENT:  Patient tolerated today's session well and is making excellent progress towards goals.  Patient is modified independent with use of RW and requires SBA with ambulation without device. Patient negotiated 24 steps with 1 HR requiring SBA/supervision for safety.  Educated patient on the importance of wearing [employer], as well and demonstrating how to make it tighter.  Patient could benefit from continued skilled PT to address decreased balance to improve safety in the community.    Progression toward goals:  [x]       Improving appropriately and progressing toward goals  []       Improving slowly and progressing toward goals  []       Not making progress toward goals and plan of care will be adjusted      PLAN:  Patient continues to benefit from skilled intervention to address the above impairments.  Continue treatment per established plan of care.  Discharge Recommendations:  Home with Home health PT;Home with assist PRN  Further Equipment Recommendations for Discharge:        Rolling             Estimated Discharge Date:01/26/24    Activity Tolerance:   Good  Please refer to the flowsheet for vital signs taken during this treatment.    After treatment:   []  Patient left in no apparent distress in bed  []  Patient left in no apparent distress sitting up in chair  [x]  Patient left in no apparent distress in w/c mobilizing under own power  []  Patient left in no apparent distress dining area  []  Patient left in no apparent distress mobilizing under own power  []  Call bell left within reach  []  Nursing notified  []  Caregiver present  []  Bed alarm activated   [x]  Chair alarm activated        Armstead Peaks, PTA  01/24/2024

## 2024-01-25 LAB — POCT GLUCOSE
POC Glucose: 243 mg/dL — ABNORMAL HIGH (ref 70–110)
POC Glucose: 145 mg/dL — ABNORMAL HIGH (ref 70–110)
POC Glucose: 168 mg/dL — ABNORMAL HIGH (ref 70–110)
POC Glucose: 154 mg/dL — ABNORMAL HIGH (ref 70–110)

## 2024-01-25 MED FILL — BUSPIRONE HCL 5 MG PO TABS: 5 MG | ORAL | Qty: 3

## 2024-01-25 MED FILL — GABAPENTIN 300 MG PO CAPS: 300 MG | ORAL | Qty: 1

## 2024-01-25 MED FILL — ACETAMINOPHEN 325 MG PO TABS: 325 MG | ORAL | Qty: 2

## 2024-01-25 MED FILL — LOSARTAN POTASSIUM 25 MG PO TABS: 25 MG | ORAL | Qty: 1

## 2024-01-25 MED FILL — PANTOPRAZOLE SODIUM 40 MG PO TBEC: 40 MG | ORAL | Qty: 1

## 2024-01-25 MED FILL — OYSTER SHELL CALCIUM W/D 500-5 MG-MCG PO TABS: 500-5- MG-MCG | ORAL | Qty: 1

## 2024-01-25 MED FILL — LAMOTRIGINE 25 MG PO TABS: 25 MG | ORAL | Qty: 1

## 2024-01-25 MED FILL — POLYETHYLENE GLYCOL 3350 17 G PO PACK: 17 g | ORAL | Qty: 1

## 2024-01-25 MED FILL — LANTUS 100 UNIT/ML SC SOLN: 100 UNIT/ML | SUBCUTANEOUS | Qty: 14

## 2024-01-25 MED FILL — ATORVASTATIN CALCIUM 40 MG PO TABS: 40 MG | ORAL | Qty: 1

## 2024-01-25 MED FILL — SERTRALINE HCL 50 MG PO TABS: 50 MG | ORAL | Qty: 1

## 2024-01-25 MED FILL — BUDESONIDE 0.5 MG/2ML IN SUSP: 0.52 MG/2ML | RESPIRATORY_TRACT | Qty: 2

## 2024-01-25 MED FILL — BAYER LOW DOSE 81 MG PO TBEC: 81 MG | ORAL | Qty: 1

## 2024-01-25 MED FILL — HUMALOG 100 UNIT/ML IJ SOLN: 100 UNIT/ML | INTRAMUSCULAR | Qty: 1

## 2024-01-25 NOTE — Plan of Care (Addendum)
Problem: Occupational Therapy - Adult  Goal: By Discharge: Performs self-care activities at highest level of function for planned discharge setting.  See evaluation for individualized goals.  Description: Occupational Therapy Goals   Long Term Goals  Initiated 01/20/2024 and to be accomplished within 2 week(s) 02/03/2024 (Updated on 01/25/2024 for discharge)   1. Pt will perform self-feeding with ModI. (Goal met 01/25/2024)  2. Pt will perform grooming with ModI. (Goal met 01/25/2024)  3. Pt will perform UB bathing with Mod. (Goal met 01/25/2024)  4. Pt will perform LB bathing with ModI. (Goal not met 01/25/2024)  5. Pt will perform tub/shower transfer with ModI. (Goal not met 01/25/2024)  6. Pt will perform UB dressing with ModI. (Goal met 01/25/2024)  7. Pt will perform LB dressing with ModI. (Goal met 01/25/2024)  8. Pt will perform toileting task with ModI. (Goal met 01/25/2024)  9. Pt will perform toilet transfer with ModI. (Goal met 01/25/2024)  10.  Pt will perform an IADL task with good safety and ModI. (Goal not met 01/24/2023)    Short Term Goals   Initiated 01/20/2024 and to be accomplished within 7 day(s) 01/27/2024 (updated for discharge 01/25/2024)  1. Pt will perform self-feeding with S. (Goal met 01/25/2024)  2. Pt will perform grooming with S.(Goal met 01/25/2024)  3. Pt will perform UB bathing with S. (Goal met 01/25/2024)  4. Pt will perform LB bathing with S. (Goal met 01/25/2024)  5. Pt will perform tub/shower transfer with S. (Goal met 01/25/2024)  6. Pt will perform UB dressing with S. (Goal met 01/25/2024)  7. Pt will perform LB dressing with S. (Goal met 01/25/2024)  8. Pt will perform toileting task with S.(Goal met 01/25/2024)  9. Pt will perform toilet transfer with S. (Goal met 01/25/2024)         Outcome: Progressing   OCCUPATIONAL THERAPY DISCHARGE    Patient: Colleen [name] (63 y.o. female)  Date: 01/25/2024    First Tx Session  Time In:0930  Time Out:1100  Primary Diagnosis: Spinal stenosis [M48.00] Spinal stenosis    Precautions:  Restrictions/Precautions: General Precautions, Fall Risk  Required Braces or Orthoses?: Yes  Aspen collar on at all times; no lifting > 5 pounds. Cervical spinal precautions.     Barriers to Learning/Limitations: yes;  physical  Compensate with: visual, verbal, tactile, kinesthetic cues/model     Patient identified with name and DOB: yes    SUBJECTIVE:   Patient stated "my daughters are coming into town to help me."    OBJECTIVE DATA SUMMARY:     Past Medical History:   Diagnosis Date    Asthma     Cardiomyopathy (HCC)     Diabetes (HCC)     HTN (hypertension)      Past Surgical History:   Procedure Laterality Date    BREAST BIOPSY Right     benign more than 30 years ago     Prior Level of Function and Home Situation:  Apartment  One level Stairs to enter with rails Independent Apartment Independent     [x]      Right hand dominant   []      Left hand dominant     Pain:  Pt reports 3/10 pain or discomfort prior to treatment; neck/ shoulder   Pt reports 3/10 pain or discomfort post treatment; neck/ shoulder  Scale: Numeric Rating Scale  Location: Neck/ shoulder  Quality: Aching  Intervention(s): Nurse notified, Repositioning , and Rest  Problem List:    Decreased  strength B UE  [x]      Decreased strength trunk/core  [x]      Decreased AROM   [x]      Decreased PROM  []      Decreased balance sitting  []      Decreased balance standing  [x]      Decreased endurance  [x]      Pain  []        Functional Limitations:   Decreased independence with ADL  [x]      Decreased independence with functional transfers  [x]      Decreased independence with ambulation  [x]      Decreased independence with IADL  [x]        Outcome Measures:     MMT Initial Assessment   Right Upper Extremity  Left Upper Extremity    Shoulder flexion 3/5 4/5   Shoulder extension 3/5 4/5   Elbow Flexion 3/5 4/5   Elbow Extension 3/5 4/5   Wrist Extension/Flexion 3/5 4/5   Grip 3+/5 4/5     MMT Discharge Assessment   Right Upper Extremity  Left Upper Extremity     Shoulder flexion 3+/5 tested within cervical spinal precaution 4/5 tested within cervical spinal precaution   Shoulder extension 3+/5 4/5   Elbow Flexion 4-/5 4/5   Elbow Extension 4-/5 4/5   Grip 3+/5 4/5     0/5 No palpable muscle contraction  1/5 Palpable muscle contraction, no joint movement  2-/5 Less than full range of motion in gravity eliminated position  2/5 Able to complete full range of motion in gravity eliminated position  2+/5 Able to initiate movement against gravity  3-/5 More than half but not full range of motion against gravity  3/5 Able to complete full range of motion against gravity  3+/5 Completes full range of motion against gravity with minimal resistance  4-/5 Completes full range of motion against gravity with minimal resistance  4/5 Completes full range of motion against gravity with moderate resistance  5/5 Completes full range of motion against gravity with maximum resistance    GROSS ASSESSMENT Initial Assessment Discharge Assessment   AROM Generally decreased, functional (RUE generally decreased (shoulder flexion ~70 degrees); LUE AROM WFL) Generally decreased, functional; slightly decreased at right shoulder for shoulder flexion when compared to left side   PROM Within functional limits Within functional limits   Coordination Generally decreased, functional   Generally decreased, functional    Sensation Impaired (RUE impaired sensation (numbness; pins and needles sensation)) Impaired (Intermittent RUE pins and needles sensation)     BALANCE Initial Assessment Discharge Assessment   Sitting Balance Supervision Modified independent    Standing Balance Contact guard assistance Supervision (Mod I/ supervision)   Standing Comments Functional - Mobility Device: Rolling Walker  Activity: To/from bathroom  Assist Level: Supervision  Functional Mobility Comments: Functional mobility performed (supv) using FWW; gait belt to/ from bathroom to address self-care needs. Functional - Mobility  Device: Rolling Walker  Activity: To/from bathroom  Assist Level: Modified independent   Functional Mobility Comments: Functional mobility performed (Mod I) using FWW; gait belt to/ from bathroom to address toileting self-care needs.     COGNITION/PERCEPTION Initial Assessment Discharge Assessment 01/25/2024   Overall orientation status Within Normal Limits Within Normal Limits   Orientation level Oriented X4 Oriented X4   Overall cognitive status WNL  Exceptions      Alertness Appears intact Appears intact   Following Commands Appears intact  Appears intact   Memory  (Verbal cues/ reminders for cervical precautions.)  (  Verbal cues/ reminders for cervical precautions.)       Safety Judgment Good awareness of safety precautions  (Verbal cues and reminders for maintaining cervical precautions within context of self-care/ ADL's and functional mobility.)   Problem Solving Assistance required to generate solutions  Assistance required to generate solutions   Initiation Does not require cues Does not require cues     Sequencing Requires cues for some Requires cues for some   Overall Sensation Status WFL (BLE intact to light touch) Intermittent RUE pins and needles sensation     EATING Initial Assessment Discharge Assessment 01/25/2024   Functional Level Supervision Independent   Clinical factors Self-care feeding (Mod I) for meal trays. Pt reports increased time for opening containers, packets, and lids on tray. Pt reports using standard eating utensils using right or left hand. Pt held ensure in right hand to bring up to mouth to take sips from straw. Self-feeding (independently) using eating utensil held in left hand to bring loaded eating utensil up to mouth. Pt reports ability to open small packets and containers on tray.     GROOMING Initial Assessment Discharge Assessment 01/25/2024   Functional Level Setup Modified independent    Clinical factors  Pt washed face seated in w/c at sink Patient washed face (Mod I) using  warm bath wipe from seated position. Patient applied deodorant to bilateral axilla at conclusion of UB bathing task. Pt performed oral hygiene/ brushed teeth with (Mod I); grooming supplies set-up at sink side.     UPPER BODY BATHING Initial Assessment Discharge Progress Assessment 01/25/2024   Functional Level    Stand by assistance UE Bathing: Modified independent    Method Protective barrier, Soap and water Bath wipes   Clinical Factors Pt washed UB seated in w/c at sink Patient performed UB bathing (Mod I) from seated position. Pt demonstrating ability to bathe chest, abd, axilla, and BUE's using bath wipes. Patient performed bathing using bath wipes from warmer per patient preference. Pt reporting "I'm too tired right now for a shower, I'll use the wipes."     LOWER BODY BATHING Initial Assessment Discharge Progress Assessment 01/25/2024   Functional Level    Stand by assistance Modified independent ;Setup   Clinical Factors   Pt washed thighs, lower leg, and feet seted on toilet using wipes.  Pt able to bring leg up to wash feet Patient performed LB bathing (Mod I/ set-up) from seated position and in stance. Pt demonstrating ability to bathe thighs to distal LE's and feet using crossed leg technique using bath wipes. Pt stood to wash peri area, hips, and buttocks using bath wipes. Patient performed bathing using bath wipes from warmer per patient preference. Pt reporting "I'm too tired right now for a shower, I'll use the wipes." Mod I/ set-up assistance 2/2 cervical spinal precautions.     SHOWER TRANSFER  Initial Assessment Discharge Progress Assessment 01/25/2024   Transfer Technique  Ambulating (stand step xfer)   Primary school teacher Level Not tested Supervision   Control and instrumentation engineer Comments Not tested during initial OT evaluation.  Stand step xfer performed (supv) using FWW; gait belt to/ from tub transfer bench. (performed on 01/23/2024)     UPPER BODY  DRESSING/UNDRESSING Initial Assessment Discharge Assessment 01/25/2024   Functional Level Minimal assistance Modified independent    Clinical Factors Pt required asst to doff/donn 2/2 to cervical neck brace Patient doffed/ donned pullover shirt (Mod I) from seated position.  LOWER BODY DRESSING/UNDRESSING Initial Assessment Discharge Assessment 01/25/2024   Functional Level Stand by assistance Modified independent    Clinical Factors Pt thread BLE through underwear and pants seated on toilet Patient performed LB dressing (Mod I) from seated position and in stance. Pt demonstrating ability to thread BLE's into pant legs from seated position and stood to pull clothing up over hips to waist.     TOILETING Initial Assessment Discharge Assessment 01/25/2024   Functional Level Stand by assistance Modified independent    Clinical factors Toileting performed (set-up/ distant supv) for hygiene performed seated on toilet and for clothing management in stance. Pt voided at this time. Toileting performed (Mod I) for hygiene performed seated on toilet and for clothing management in stance. Pt voided at this time.     TOILET TRANSFER INDEPENDENCE Initial Assessment Discharge Progress Assessment 01/25/2024   Transfer Technique Stand step (using RW) Stand step   Level of Assistance Stand by assistance Modified independent   Equipment Standard bedside commode Standard toilet   Toilet Transfer Comments Functional stand step xfer performed (supv) using FWW to/ from standard toilet; pt voided at this time. Functional stand step xfer performed (Mod I) using FWW to/ from standard toilet; pt voided at this time.     WHEELCHAIR/BED TRANSFER INDEPENDENCE Initial Assessment Discharge Assessment 01/25/2024   Transfer Technique   Stand step (using RW.) Stand step   Level of Assistance Minimal assistance Modified independent    Equipment Wheelchair Other (recliner chair)   Comments Pt performed sit to stand then side step to bed with SBA.  Pt required  vcs for tech. Stand step xfer (Mod I) using FWW; gait belt to seated position in recliner chair.     Skin Integrity: see nursing assessment    Therapeutic Exercise:  RUE/ LUE AROM using hand clasped technique for shoulder flexion to 90 degrees, elbow flexion/ extension, supination/ pronation, and IR/ ER. RUE/ LUE there ex using medium resistance theraband (15 reps x2 sets) for shoulder flexion to 90 degrees, elbow flexion, and chest pull (to be performed within cervical spinal precautions). Right/ left hand there ex using medium resistance theraputty for rolling, squeezing, stretching, and pinching putty; for hand/ digit and wrist strengthening. HEP printouts reviewed and placed in patient's education binder. Reviewed cervical spinal precautions.    Activity Tolerance:   Patient Tolerated treatment well;Patient limited by fatigue   PRN rest breaks due to fatigue. Patient reporting fatigue following earlier physical therapy session. Pt stated "I did alot of walking."   Please refer to the flowsheet for vital signs taken during this treatment.     EDUCATION:   Education Given To: Patient  Education Provided: Home Exercise Program;Safety;Mobility Art gallery manager;Fall Prevention Strategies;Energy Conservation;Precautions;ADL Function;DME/Home Modifications  Education Provided Comments: Reviewed cervical precautions and HEP for carryover of exercises to home environment; printout provided.  Education Method: Demonstration;Verbal  Barriers to Learning:  (Verbal cues and reminders for cervical spinal precautions.)  Education Outcome: Verbalized understanding;Demonstrated understanding    ASSESSMENT:  Patient improved and progressed toward goals having met 7/10 LTG's and 9/9 STG's during pt's inpatient rehab stay. Therapist reassessed goals based on patient's current functional ability and demonstrated performance. UB ADL's progressed to (Mod I); LB ADL's performed (set-up/ Mod I). Patient would benefit from  Outpatient occupational therapy to further address decreased RUE AROM, decreased sensation RUE, cervical spinal precautions, decreased functional strength/ endurance, decreased dynamic standing balance/ coordination, and functional mobility limiting patient's functional independence and safety with ADL's/ IADL's and mobility for  return to prior level of functioning.   Progression toward goals:  [x]       Improving appropriately and progressing toward goals  []       Improving slowly and progressing toward goals  []       Not making progress toward goals and plan of care will be adjusted     PLAN:  Pt would benefit from continued skilled occupational therapy in order to improve ADL function and IADL with use of least restrictive device. Interventions may include range of motion (AROM, PROM B UE/trunk), motor function (B UE/trunk strengthening/coordination), activity tolerance (vitals, oxygen saturation levels), ADL, balance activities, IADL, and functional transfer training.   Rationale for discharge:  []  Goals Achieved  []  Plateau Reached  []  Patient not participating in therapy  [x]  Other: Patient met 7/10 LTG's and 9/9 STG's    Discharge Recommendations:  Home Health with PRN assist/ supv; Outpatient occupational therapy   Further Equipment Recommendations for Discharge:  tub transfer bench (private pay purchase)      Please refer to the flow sheet for vital signs taken during this treatment.  After treatment:   [x]   Patient left in no apparent distress sitting up in recliner chair with needs met  []   Patient left in no apparent distress in bed  []   Patient handoff to SLP/PT  [x]   Call bell left within reach  [x]   Nursing notified  []   Caregiver present  [x]   Chair alarm activated    COMMUNICATION/EDUCATION:   [x]  Home safety education was provided and the patient/caregiver indicated understanding.  [x]  Patient/family have participated as able in goal setting and plan of care.  [x]  Patient/family agree to work toward  stated goals and plan of care.  []  Patient understands intent and goals of therapy, but is neutral about his/her participation.  []  Patient is unable to participate in goal setting and plan of care.    IRF-PAI Completed:  yes  Entered Differentiated Treatment minutes: yes    Dorina Hoyer, OT  01/25/2024

## 2024-01-25 NOTE — Progress Notes (Signed)
4 Eyes Skin Assessment     NAME:  Colleen [name]  DATE OF BIRTH:  08/14/1961  MEDICAL RECORD NUMBER:  [id number]    The patient is being assessed for  Shift Handoff    I agree that at least one RN has performed a thorough Head to Toe Skin Assessment on the patient. ALL assessment sites listed below have been assessed.      Areas assessed by both nurses:    Head, Face, Ears, Shoulders, Back, Chest, Arms, Elbows, Hands, Sacrum. Buttock, Coccyx, Ischium, and Legs. Feet and Heels        Does the Patient have a Wound? Yes wound(s) were present on assessment. LDA wound assessment was Initiated and completed by RN       Braden Prevention initiated by RN: Yes  Wound Care Orders initiated by RN: No    Pressure Injury (Stage 3,4, Unstageable, DTI, NWPT, and Complex wounds) if present, place Wound referral order by RN under ORDER ENTRY: No    New Ostomies, if present place, Ostomy referral order under ORDER ENTRY: No     Nurse 1 eSignature: Electronically signed by Laverle Hobby, RN on 01/25/24 at 6:15 AM EST    **SHARE this note so that the co-signing nurse can place an eSignature**    Nurse 2 eSignature: Electronically signed by Orvilla Cornwall, LPN on 12/22/76 at 3:27 PM EST

## 2024-01-25 NOTE — Progress Notes (Signed)
Sw spoke with pt regarding dc plans for tomorrow. Pt offere freedom of choice regarding outpatient therapy. Pt states preference for close to home and oks set up at [facility] for pt. Pt states that she will arrange for a tub transfer bench privately. Pt agreeable to sw ordering a rolling walker through [employer].     No further needs are noted.

## 2024-01-25 NOTE — Discharge Instructions (Signed)
------------------------------------------------------------------------------------------------------------  DISCHARGE INSTRUCTIONS    1. Make sure that when you request refills at the pharmacy that the refill requests are sent to your PCP (NOT to the prescriber at the [facility] for Physical Rehabilitation) to avoid any delays in getting your medication refills. The physician at the [facility] for Physical Rehabilitation will not able to order medications or refills after discharge -- Please understand that though we would like to help, it is simply not safe for our physician to order you a medication that we cannot monitor.     2. If any of the prescribed medications require a PRIOR AUTHORIZATION, contact your Primary Care Physician or specialist to EITHER complete prior authorizations and paperwork on your behalf OR prescribe an alternative medication. -- Please understand that though we would like to help, it is simply not safe for our physician to order you a medication that we cannot monitor.     3. If any of your prescriptions medications PRIOR TO ADMISSION TO THE HOSPITAL needs a refill (and either the dosage, frequency or instructions was not changed), kindly contact your Primary Care Physician for refills.    -------------------------------------------------------------------------------------------------------------------

## 2024-01-25 NOTE — Progress Notes (Signed)
4 Eyes Skin Assessment     NAME:  [name]  DATE OF BIRTH:  20-Jan-1961  MEDICAL RECORD NUMBER:  [id number]    The patient is being assessed for  Shift Handoff    I agree that at least one RN has performed a thorough Head to Toe Skin Assessment on the patient. ALL assessment sites listed below have been assessed.      Areas assessed by both nurses:    Shoulders, Back, Chest, Sacrum. Buttock, Coccyx, Ischium, Legs. Feet and Heels, and Other Neck        Does the Patient have a Wound? Yes wound(s) were present on assessment. LDA wound assessment was Initiated and completed by RN       Braden Prevention initiated by RN: Yes  Wound Care Orders initiated by RN: No    Pressure Injury (Stage 3,4, Unstageable, DTI, NWPT, and Complex wounds) if present, place Wound referral order by RN under ORDER ENTRY: No    New Ostomies, if present place, Ostomy referral order under ORDER ENTRY: No     Nurse 1 eSignature: Electronically signed by Orvilla Cornwall, LPN on 4/0/98 at 6:17 PM EST    **SHARE this note so that the co-signing nurse can place an eSignature**    Nurse 2 eSignature: Electronically signed by Laverle Hobby, RN on 01/25/24 at 7:24 PM EST

## 2024-01-25 NOTE — Plan of Care (Signed)
Problem: Chronic Conditions and Co-morbidities  Goal: Patient's chronic conditions and co-morbidity symptoms are monitored and maintained or improved  01/25/2024 0752 by Orvilla Cornwall, LPN  Outcome: Progressing  01/25/2024 0321 by Laverle Hobby, RN  Outcome: Progressing  Flowsheets (Taken 01/24/2024 2003)  Care Plan - Patient's Chronic Conditions and Co-Morbidity Symptoms are Monitored and Maintained or Improved:   Monitor and assess patient's chronic conditions and comorbid symptoms for stability, deterioration, or improvement   Collaborate with multidisciplinary team to address chronic and comorbid conditions and prevent exacerbation or deterioration   Update acute care plan with appropriate goals if chronic or comorbid symptoms are exacerbated and prevent overall improvement and discharge

## 2024-01-25 NOTE — Progress Notes (Signed)
[facility]  [address], [place], [place] [address]     INPATIENT REHABILITATION  DAILY PROGRESS NOTE     Date: 01/25/2024    Name: Colleen [name] Age / Sex: 63 y.o. / female   CSN: [id number] MRN: [id number]   Admit Date: 01/19/2024 Length of Stay: 6 days     Primary Rehabilitation Diagnosis   Impaired mobility and ADLs in the setting of cervical spinal stenosis and radiculopathy, status post C3-T2 fusion and C4-6 laminectomy and C3 and C7 dome laminectomy      Subjective:     I personally saw and evaluated this patient face-to-face today.  Patient is sitting in bed in no apparent distress.  Patient is in good spirits and looking forward to going home tomorrow.    Objective:     Vital Signs:  Patient Vitals for the past 24 hrs:   BP Temp Temp src Pulse Resp SpO2   01/25/24 1547 135/63 98.6 F (37 C) Oral 63 18 97 %   01/25/24 0800 111/61 97.7 F (36.5 C) Oral 69 17 99 %   01/24/24 2000 (!) 141/91 98.1 F (36.7 C) Oral 64 17 98 %        Physical Examination:  General:  Awake, alert  Cardiovascular:  S1S2+, RRR  Pulmonary:  CTA b/l  GI:  Soft, BS+, NT, ND  Extremities:  No edema  Right upper extremity weakness    Current Medications:  Current Facility-Administered Medications   Medication Dose Route Frequency    albuterol (PROVENTIL) (2.5 MG/3ML) 0.083% nebulizer solution 2.5 mg  2.5 mg Nebulization Q4H PRN    aspirin EC tablet 81 mg  81 mg Oral Daily    atorvastatin (LIPITOR) tablet 40 mg  40 mg Oral Daily    busPIRone (BUSPAR) tablet 15 mg  15 mg Oral Daily    budesonide (PULMICORT) nebulizer suspension 1,000 mcg  1 mg Nebulization BID RT    gabapentin (NEURONTIN) capsule 300 mg  300 mg Oral Nightly    losartan (COZAAR) tablet 25 mg  25 mg Oral Daily    hydrALAZINE (APRESOLINE) tablet 10 mg  10 mg Oral Q6H PRN    nitroGLYCERIN (NITROSTAT) SL tablet 0.4 mg  0.4 mg SubLINGual Q5 Min PRN    sertraline (ZOLOFT) tablet 50 mg  50 mg Oral Daily    oyster shell calcium w/D 500-5 MG-MCG tablet  1 tablet  1 tablet Oral BID    insulin glargine (LANTUS) injection vial 14 Units  14 Units SubCUTAneous Nightly    polyethylene glycol (GLYCOLAX) packet 17 g  17 g Oral Daily    pantoprazole (PROTONIX) tablet 40 mg  40 mg Oral QAM AC    lamoTRIgine (LAMICTAL) tablet 25 mg  25 mg Oral BID    oxyCODONE (ROXICODONE) immediate release tablet 5 mg  5 mg Oral Q6H PRN    naloxone (NARCAN) injection 0.4 mg  0.4 mg IntraVENous PRN    methocarbamol (ROBAXIN) tablet 500 mg  500 mg Oral Q6H PRN    glucose chewable tablet 16 g  4 tablet Oral PRN    dextrose bolus 10% 125 mL  125 mL IntraVENous PRN    Or    dextrose bolus 10% 250 mL  250 mL IntraVENous PRN    glucagon emergency SOLR 1 mg  1 mg SubCUTAneous PRN    dextrose 10 % infusion   IntraVENous Continuous PRN    acetaminophen (TYLENOL) tablet 650 mg  650 mg Oral Q4H PRN  bisacodyl (DULCOLAX) EC tablet 5 mg  5 mg Oral Daily PRN    insulin lispro (HUMALOG,ADMELOG) injection vial 0-4 Units  0-4 Units SubCUTAneous 4x Daily AC & HS       Allergies:  Allergies   Allergen Reactions    Amoxicillin Itching, Rash and Other (See Comments)     56may07 mss RASH AND BREAKOUT...[phone]       Lab/Data Review:  Recent Results (from the past 24 hour(s))   POCT Glucose    Collection Time: 01/24/24  9:33 PM   Result Value Ref Range    POC Glucose 243 (H) 70 - 110 mg/dL   POCT Glucose    Collection Time: 01/25/24  7:07 AM   Result Value Ref Range    POC Glucose 145 (H) 70 - 110 mg/dL   POCT Glucose    Collection Time: 01/25/24 11:01 AM   Result Value Ref Range    POC Glucose 168 (H) 70 - 110 mg/dL   POCT Glucose    Collection Time: 01/25/24  3:26 PM   Result Value Ref Range    POC Glucose 154 (H) 70 - 110 mg/dL         Assessment:     Primary Rehabilitation Diagnosis   Impaired mobility and ADLs in the setting of cervical spinal stenosis and radiculopathy, status post C3-T2 fusion and C4-6 laminectomy and C3 and C7 dome laminectomy     Other Co-Morbid Conditions managed in Rehab       Type 2 diabetes  Hypertension  Dyslipidemia  Anxiety and depression  GERD  Leukocytosis    MEDICAL PLAN:     Impaired mobility and ADLs in the setting of cervical spinal stenosis and radiculopathy, status post C3-T2 fusion and C4-6 laminectomy and C3 and C7 dome laminectomy  Physical therapy and Occupational Therapy  Keep hard cervical collar on at all times  Judicious pain control  Monitor wound     Type 2 diabetes  Continue Lantus 14 units at bedtime  Monitor sugars ACHS  Sliding scale insulin     Hypertension  Continue losartan 50 mg daily  As needed hydralazine  Monitor blood pressure     Dyslipidemia  Continue atorvastatin 40 mg daily     Anxiety and depression  Continue BuSpar 15 mg daily  Continue Zoloft 50 mg daily     GERD  Continue Protonix     Leukocytosis which appears to be improving.  Patient is afebrile and nontoxic.  No history of any cough fever or chills.  Patient denies any nausea or abdominal pain or diarrhea  Monitor intermittently    01/22/24-continue physical therapy and Occupational Therapy.  Monitor wound.  Continue Lantus and sliding scale insulin.  Monitor blood pressure.  Check CBC in the morning.  I discussed care plan with the patient    01/23/24-continue PT OT.  Patient has pico dressing.  Please see APC note regarding conversation with patient's spine surgical team.  Continue Lantus and sliding scale.  Monitor blood pressure.  I discussed care plan with the patient.  Labs reviewed.  Leukocytosis has resolved.  I discussed with the social worker and the therapist    01/24/24-physical therapy and Occupational Therapy.  Lantus and sliding scale.  I discussed care plan with the patient.  I discussed discharge plans with the patient    01/25/24-PT and OT.  Continue sliding scale insulin and Lantus.  I discussed care plan with the patient.  I discussed discharge plans with  the patient.  I discussed with ARU APC.  I discussed with social worker      Body mass index is 31.95 kg/m.      Functional  Progress:    PHYSICAL THERAPY    ON ADMISSION MOST RECENT   Balance  Balance  Posture: Fair  Sitting - Static: Good  Sitting - Dynamic: Good  Standing - Static: Fair, +  Standing - Dynamic: Fair, -  Fair  Good  Good  Fair;+  Fair;-      Balance  Balance  Posture: Fair  Sitting - Static: Good  Sitting - Dynamic: Good  Standing - Static: Fair, +  Standing - Dynamic: Fair, -  Posture: Fair  Sitting - Static: Good  Sitting - Dynamic: Good  Standing - Static: Fair;+  Standing - Dynamic: Fair;-        Bed Mobility  Stand by assistance  Stand by assistance  Contact guard assistance  Contact guard assistance  Contact guard assistance  Contact guard assistance  Contact guard assistance Bed Mobility  Rolling to Right: Independent  Rolling to Left: Independent  Supine to Sit: Independent  Sit to Stand: Modified independent  Sit to Supine: Independent  Bed to Chair: Modified independent  Car Transfer: Modified independent   Insurance risk surveyor: No         Ambulation  Programmer, systems guard assistance  Level tile  65 feet Ambulation  Device: Agricultural consultant  Assistance: Modified Independent  Surface: Level tile  Distance: 600 feet continuously; 278 feet (gym>room)   Stairs  Yes  Bilateral  Contact guard assistance Stairs  Stairs?: Yes  Rails: Right ascending  Assistance: Supervision, Contact guard assistance (Supervision with steps and CGA with curb negotiation)     Functional Progress:    OCCUPATIONAL THERAPY    ON ADMISSION MOST RECENT   Eating  Supervision Eating  Feeding: Independent   Grooming  Setup Grooming  Grooming: Setup, Modified independent    Bathing  UB Bathing Stand by assistance  LB Bathing Stand by assistance Bathing  UB Bathing   UE Bathing: Setup  LB Bathing   ZOXW([id number])@   Upper Body Dressing  Minimal assistance   Upper Body Dressing  UE Dressing: Setup   Lower Body Dressing  Stand by assistance Lower Body Dressing  LE Dressing: Setup, Supervision    Toileting  Stand by assistance Toileting  Toileting: Modified independent    Toilet Transfers  Stand by Production designer, theatre/television/film Transfer: Modified independent   Tub Transfers  Tub Transfers: Not tested  [employer] - Transfer From: Adult nurse - Transfer Type: To and From  Shower - Transfer To: Advertising account planner - Technique: Ambulating (stand step xfer)  Shower Transfers: Not tested  [employer] Comments: Stand step xfer performed (supv) using FWW; gait belt to/ from tub transfer bench. Tub Transfers  Tub Transfers  Tub Transfers: Not tested  SHOWER Transfers  Tub Transfers  Tub Transfers: Not tested     Legend:   7 - Independent   6 - Modified Independent   5 - Standby Assistance / Supervision / Set-up   4 - Minimum Assistance / Contact Guard Assistance   3 - Moderate Assistance   2 - Maximum Assistance   1 - Total Assistance / Dependent             Plan:  1. Justification for continued stay: Good progression towards established rehabilitation goals.    2. Medical Issues being followed closely:    [x]   Fall and safety precautions     [x]   Wound Care     [x]   Bowel and Bladder Function     [x]   Fluid Electrolyte and Nutrition Balance     [x]   Pain Control      3. Issues that 24 hour rehabilitation nursing is following:    [x]   Fall and safety precautions     [x]   Wound Care     [x]   Bowel and Bladder Function     [x]   Fluid Electrolyte and Nutrition Balance     [x]   Pain Control      [x]   Assistance with and education on in-room safety with transfers to and from the bed, wheelchair, toilet and shower.      4. Acute rehabilitation plan of care:    [x]   Continue current care and rehab.           [x]   Physical Therapy           [x]   Occupational Therapy           []   Speech Therapy      []   Hold Rehab until further notice     5. Medications:    [x]   MAR Reviewed     [x]   Continue Present Medications       6. Chemical DVT Prophylaxis:      []   Enoxaparin     []    Unfractionated Heparin     []   Warfarin     []   NOAC     []   Aspirin     [x]   None     7. Mechanical DVT Prophylaxis:      [x]   TED Stockings     [x]   Sequential Compression Device     []   None     8. GI Prophylaxis:      [x]   PPI     []   H2 Blocker     []   None / Not indicated     9. Code status:Full     Dragon medical dictation software was used for portions of this report. Unintended errors may occur.      Signed:    Jonah Blue, MD      January 25, 2024

## 2024-01-25 NOTE — Plan of Care (Signed)
Problem: Physical Therapy - Adult  Goal: By Discharge: Performs mobility at highest level of function for planned discharge setting.  See evaluation for individualized goals.  Description: Physical Therapy Short Term Goals  Initiated 01/20/2024 and to be accomplished within 7 day(s)  1.  Patient will supine <> sit  in bed with supervision/set-up to decrease risk of skin break.  2.  Patient will transfer from bed to chair and chair to bed with SBA using the least restrictive device in prep for sitting EOB ADL's.  3.  Patient will perform sit <> stand with SBA with FWW </= 50% verbal cues for task sequencing in prep for gait.     4.  Patient will ambulate with CGA </= 150 feet with the least restrictive device to progress towards independence.  5.  Patient will ascend/descend >/=8 stairs with B handrail(s) with CGA to progress toward home entrance/exit.     Physical Therapy Long Term Goals  Initiated 01/20/2024 and to be accomplished within 2 weeks  1.  Patient will perform all bed mobility task with independence to return to PLOF.  (MET 01/25/24)  2.  Patient will transfer from bed to chair and chair to bed with independence using the least restrictive device to return to PLOF. (MET 01/25/24)  3.  Patient will perform sit <> stand with modified independence in prep for gait.(MET 01/25/24)  4.  Patient will ambulate with modified independence >/=  300 feet with the least restrictive device for safe home negotiation.  (MET 01/25/24)  5.  Patient will ascend/descend 14 stairs with B handrail(s) with supervision/set-up for entrance/exit to home environment. (MET 01/25/24)    Outcome: Adequate for Discharge     PHYSICAL THERAPY DISCHARGE NOTE    Patient: Colleen [name] (63 y.o. female)  Date: 01/25/2024  Diagnosis: Spinal stenosis [M48.00]   Precautions: General Precautions;Fall Risk                Chart, physical therapy assessment, plan of care and goals were reviewed.    Time in: 0730  Time out: 0900  Entered Differentiated Treatment  minutes: Yes    Patient seen for: bed mobility, transfer training, gait training, therapeutic activities, and patient education     Pain:  Pt pain was reported as  0 pre-treatment.  Pt pain was reported as 0 post-treatment.    Patient identified with name and DOB: Yes    SUBJECTIVE:     Patient stated:I slept good last night and I am looking forward to going home.     OBJECTIVE DATA SUMMARY:     Education: Education Given To: Patient  Education Provided: Home Exercise Program;Safety;Mobility Art gallery manager;Fall Prevention Strategies;Energy Conservation  Education Method: Demonstration;Verbal  Education Outcome: Verbalized understanding;Demonstrated understanding  Problem List:    Decreased strength B LE  []      Decreased strength trunk/core  []      Decreased AROM   []      Decreased PROM  []     Decreased endurance  [x]      Decreased balance sitting  []      Decreased balance standing  [x]      Pain   []      Slow ambulation velocity  [x]     Decreased coordination  []     Decreased safety awareness  []       Functional Limitations:   Decreased independence with bed mobility  []      Decreased independence with functional transfers  []      Decreased independence with ambulation  []   Decreased independence with stair negotiation  [x]          GROSS ASSESSMENT Discharge Assessment 01/25/2024   AROM  RLE   LLE   WNL  WNL   Strength  RLE  LLE   WNL  WNL   Coordination  WFL   Tone  RLE  LLE    Normotonic   Normotonic   Sensation WNL   PROM  RLE  LLE   WNL  WNL       MMT Discharge Assessment   Right Lower Extremity Left Lower Extremity   Hip Flexion R Hip Flexion: 5/5 L Hip Flexion: 5/5   Knee Extension R Knee Extension: 5/5 L Knee Extension: 5/5   Knee Flexion R Knee Flexion: 5/5 L Knee Flexion: 5/5   Ankle Dorsiflexion  NT NT   0/5 No palpable muscle contraction  1/5 Palpable muscle contraction, no joint movement  2-/5 Less than full range of motion in gravity eliminated position  2/5 Able to complete full range of  motion in gravity eliminated position  2+/5 Able to initiate movement against gravity  3-/5 More than half but not full range of motion against gravity  3/5 Able to complete full range of motion against gravity  3+/5 Completes full range of motion against gravity with minimal resistance  4-/5 Completes full range of motion against gravity with minimal-moderate resistance  4/5 Completes full range of motion against gravity with moderate resistance  4+/5 Completes full range of motion against gravity with moderate-maximum resistance  5/5 Completes full range of motion against gravity with maximum resistance      POSTURE Discharge Assessment 01/25/2024   Posture (WDL)  Exception to [facility]   Posture Assessment Forward head with rounded shoulders          BALANCE Discharge Assessment 01/25/2024   Posture Fair   Sitting - Static Good   Sitting - Dynamic Good   Standing - Static Fair;+   Standing - Dynamic Fair;-   Comments  N/a      Ability to pick up small object from floor: patient was able to squat down to pick up object however unable to stand back up without max assist          BED/CHAIR/WHEELCHAIR TRANSFERS Initial Assessment Discharge Assessment   Rolling Right Stand by assistance Independent   Rolling Left Stand by assistance Independent   Supine to Sit Contact guard assistance Independent   Sit to Stand Contact guard assistance Modified independent   Sit to Supine Contact guard assistance Independent   Transfer Assist Score Contact guard assistance Modified independent   Comments required consistent verbal cues and demo for proper hand placement and task sequencing; patient with noted SOB following car transfer and max effort required to complete transfer    N/a   Agricultural consultant guard assistance Modified independent   Car Type Media planner simulator        WHEELCHAIR MOBILITY/MANAGEMENT Initial Assessment Discharge Assessment   Able to Propel 20 feet 278 feet (room>gym)   Assist Level    Modified independent    Curbs/ramps assistance required   N/a   Wheelchair management   Independent with brakes     GAIT Discharge Assessment 01/25/2024   Quality of Gait  Step through gait pattern    Gait Deviations  Slow cadence with decreased foot clearance        WALKING INDEPENDENCE Initial Assessment Discharge Assessment   Assistive device Librarian, academic  Ambulation assistance - surface Contact guard assistance  Level tile Modified Independent  Level tile     Distance 65 feet 600 feet continuously; 278 feet (gym>room)   Comments patient able to increase BOS following verbal cues Unable to ambulate outside today due to rainy weather         STEPS/STAIRS Initial Assessment Discharge Assessment   Steps/Stairs ambulated 3  6" 24  6"   Rail Use Bilateral   Right ascending   Assistance Level Contact guard assistance Supervision;Contact guard assistance (Supervision with steps and CGA with curb negotiation)   Comments stair negotiation x 3 reps with B UE support, ascending with LLE and descending with RLE  CGA with stabilizing RW with ascending curb and CGA descending curb for safety    Curbs/Ramps 6" 6"     Therapeutic Exercises:   -Squats with 4# medicine ball x10  -Sci Fit B LE L2 x20 min for increased strength and endurance     ASSESSMENT:  Patient tolerated today's session well and has made excellent progress in her acute rehab stay.  Patient has achieved all goals and is at modified independent level with ambulation with RW.  Encouraged patient to use RW at all times for safety and decreased fall risk.  Patient could benefit from continued skilled PT once returning home to address decreased balance to improve safety in the home and community.   LTGs: 5/5 met  1.  Patient will perform all bed mobility task with independence to return to PLOF.  (MET 01/25/24)  2.  Patient will transfer from bed to chair and chair to bed with independence using the least restrictive device to return to PLOF. (MET 01/25/24)  3.  Patient will  perform sit <> stand with modified independence in prep for gait.(MET 01/25/24)  4.  Patient will ambulate with modified independence >/=  300 feet with the least restrictive device for safe home negotiation.  (MET 01/25/24)  5.  Patient will ascend/descend 14 stairs with B handrail(s) with supervision/set-up for entrance/exit to home environment. (MET 01/25/24)       PLAN:  Pt would benefit from continued skilled physical therapy in order to improve independent functional mobility at home level. Interventions may include range of motion (AROM, PROM B LE/trunk), motor function (B LE/trunk strengthening/coordination), activity tolerance (vitals, oxygen saturation levels), bed mobility training, balance activities, gait training (progressive ambulation program), and functional transfer training.     Discharge Recommendations:  Home with Home health PT;Home with assist PRN;Outpatient PT  Further Equipment Recommendations for Discharge:        Rolling             Activity Tolerance:   good  Please refer to the flowsheet for vital signs taken during this treatment.  After treatment:   []  Patient left in no apparent distress in bed  [x]  Patient left in no apparent distress sitting up in recliner chair  []  Patient left in no apparent distress in w/c mobilizing under own power  []  Patient left in no apparent distress dining area  []  Patient left in no apparent distress mobilizing under own power  [x]  Call bell left within reach  []  Nursing notified  []  Caregiver present  []  Bed alarm activated   []  Chair alarm activated        IRF-PAI Completed: yes      Armstead Peaks, PTA  01/25/2024

## 2024-01-25 NOTE — Plan of Care (Signed)
Problem: SLP Adult - Disturbed Thought Process  Goal: By Discharge: Demonstrates cognitive skills at highest level of function for planned discharge setting.   See evaluation for individualized goals.  Description: Long term goals (to be met by 2/18)  1. Pt will demonstrate alternating attention to type paragraph length material to dictation w/ 90% accy given minA to facilitate return to work. NOT ADDRESSED d/t insufficient time  2. Pt will perform complex alternating attention tasks w/ 90% accy, independently, to facilitate independence. GOAL MET  3. Pt will perform mental manipulation tasks w/ 90% accy, independently, to facilitate independence. NOT ADDRESSED d/t insufficient time  4. Pt will perform functional problem solving/reasoning tasks w/ 90% accy, independently,  to facilitate safety and independence. NOT ADDRESSED d/t insufficient time    Short term goals (to be met by 2/11)  1. Pt will demonstrate alternating attention to type sentence length material to dictation w/ 90% accy given minA to facilitate return to work. NOT ADDRESSED d/t insufficient time  2. Pt will perform complex alternating attention tasks w/ 80% accy given minA to facilitate independence. GOAL MET  3. Pt will perform mental manipulation tasks w/ 80% accy given minA to facilitate independence. NOT ADDRESSED d/t insufficient time  4. Pt will perform functional problem solving/reasoning tasks w/ 80% accy givenA to facilitate safety and independence. NOT ADDRESSED d/t insufficient time  Outcome: Completed     SPEECH LANGUAGE PATHOLOGY TREATMENT AND DISCHARGE SUMMARY      Patient: Colleen [name] (63 y.o. female)  Date: 01/25/2024  Primary Diagnosis: Spinal stenosis [M48.00]       Precautions: Aspiration        ASSESSMENT:  Pt presents with mild cognitive linguistic deficits in higher level attention and executive functioning tasks. She has met 1/4 ST and 1/4 LT goals. Some goals were not yet addressed d/t insufficient time. 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 She is highly motivated to continues to address cognition to facilitate independence and return work. She has consistent excellent participation and is very receptive to compensatory strategies, thus, making her an excellent candidate for rehab.     Progression toward goals:  []          Goals met/approximated  []          Not making progress toward goals and plan of care will be adjusted   [x]          Other: Progressing     SUBJECTIVE:   Patient stated "I'm better but not quite back to my normal thinking".    OBJECTIVE:   Daily Assessment:  Orientation: x4      Neuro-Linguistics/Exercises: Initial Assessment Discharge Progress Assessment 01/25/2024    Attention: Exceptions to [facility]  Alternating Attention: Mild  Divided Attention: Mild  Selective Attention: Mild  Sustained Attention: [facility] Attention: Exceptions to [facility]  Alternating Attention: Mild  Divided Attention: Mild  Selective Attention: Mild  Sustained Attention: WFL    Memory: Within Functional Limits Memory: Within Functional Limits    Problem Solving: Exceptions to [facility]  Simple Functional Tasks: Mild  Verbal Reasoning Skills: WFL  Initiation: WFL  Sequencing: Mild  Executive Function Skills: Mild  Managing Finances: Mild  Managing Medications: Mild Problem Solving: Exceptions to [facility]  Simple Functional Tasks: Mild  Verbal Reasoning Skills: WFL  Initiation: WFL  Sequencing: Mild  Executive Function Skills: Mild  Managing Finances: Mild  Managing Medications: Mild    Safety/Judgment: Exceptions to [facility]  Complex Functional Tasks: Mild  Novel Situations: Mild  Routine Tasks: Mild  Unable to Self-monitor and Self-correct Consistently: Mild  Insight: WFL  Impulsive: Mild  Flexibility of Thought: Mild  Safety/Judgment: Exceptions to [facility]  Complex Functional Tasks: Mild  Novel Situations: Mild  Routine Tasks: Mild  Unable to  Self-monitor and Self-correct Consistently: Mild  Insight: WFL  Impulsive: Mild  Flexibility of Thought: Mild     TREATMENT DIAGNOSIS:  mild cognitive linguistic impairment      PAIN:  Pain level pre-treatment: 0/10   Pain level post-treatment: 0/10     After treatment:   []             Patient left in no apparent distress sitting up in chair  [x]             Patient left in no apparent distress in bed  [x]             Call bell left within reach  []             Nursing notified  []             Family present  []             Caregiver present  [x]             Bed alarm activated      NEUROLINGUISTIC TREATMENT:  -complex alternating attention trailmaking in which pt was given a sequence of 25 stimuli in which she had to track based on 2 features: minA to achieve 100% accy         PLAN:    Pt has not yet reached maximum potential with skilled SLP interventions to address cognitive linguistics. Further interventions are warranted at this time .[name] Kitchen     Discharge Recommendations for Follow-up: outpatient SLP, home with PRN assist  DME Recommendations: none  Further Equipment Recommendations for Discharge: none  Estimated Discharge Date:2/7       COMMUNICATION/EDUCATION:     [x]          Patient/family have participated as able in goal setting and plan of care.  []             Patient/family agree to work toward stated goals and plan of care.  []             Patient understands intent and goals of therapy, neutral about participation.  []             Patient unable to participate in goal setting/plan of care secondary to cognition, hearing/vision deficits; education ongoing with interdisciplinary staff   []             Handout regarding diet recommendations and thickener instructions provided.  []          Posted safety precautions in patient's room.  Session 1:   Time in: 1600  Time out: 1630  Minutes: 30    Thank you for this referral,  Gypsy Balsam M.S., [facility] - SLP  Speech Language Pathologist  [facility]

## 2024-01-25 NOTE — Plan of Care (Signed)
Problem: Chronic Conditions and Co-morbidities  Goal: Patient's chronic conditions and co-morbidity symptoms are monitored and maintained or improved  Outcome: Progressing  Flowsheets (Taken 01/24/2024 2003)  Care Plan - Patient's Chronic Conditions and Co-Morbidity Symptoms are Monitored and Maintained or Improved:   Monitor and assess patient's chronic conditions and comorbid symptoms for stability, deterioration, or improvement   Collaborate with multidisciplinary team to address chronic and comorbid conditions and prevent exacerbation or deterioration   Update acute care plan with appropriate goals if chronic or comorbid symptoms are exacerbated and prevent overall improvement and discharge     Problem: Pain  Goal: Verbalizes/displays adequate comfort level or baseline comfort level  Outcome: Progressing     Problem: Neurosensory - Adult  Goal: Achieves maximal functionality and self care  Outcome: Progressing  Flowsheets (Taken 01/24/2024 2003)  Achieves maximal functionality and self care: Monitor swallowing and airway patency with patient fatigue and changes in neurological status     Problem: Skin/Tissue Integrity - Adult  Goal: Skin integrity remains intact  Outcome: Progressing  Flowsheets (Taken 01/24/2024 2003)  Skin Integrity Remains Intact: Monitor for areas of redness and/or skin breakdown     Problem: Skin/Tissue Integrity - Adult  Goal: Incisions, wounds, or drain sites healing without S/S of infection  Outcome: Progressing  Flowsheets (Taken 01/24/2024 2003)  Incisions, Wounds, or Drain Sites Healing Without Sign and Symptoms of Infection:   ADMISSION and DAILY: Assess and document risk factors for pressure ulcer development   TWICE DAILY: Assess and document skin integrity   TWICE DAILY: Assess and document dressing/incision, wound bed, drain sites and surrounding tissue     Problem: Musculoskeletal - Adult  Goal: Return mobility to safest level of function  Outcome: Progressing  Flowsheets (Taken  01/24/2024 2003)  Return Mobility to Safest Level of Function: Assess patient stability and activity tolerance for standing, transferring and ambulating with or without assistive devices     Problem: Musculoskeletal - Adult  Goal: Maintain proper alignment of affected body part  Outcome: Progressing  Flowsheets (Taken 01/24/2024 2003)  Maintain proper alignment of affected body part: Support and protect limb and body alignment per provider's orders     Problem: Musculoskeletal - Adult  Goal: Return ADL status to a safe level of function  Outcome: Progressing  Flowsheets (Taken 01/24/2024 2003)  Return ADL Status to a Safe Level of Function:   Administer medication as ordered   Assess activities of daily living deficits and provide assistive devices as needed   Assist and instruct patient to increase activity and self care as tolerated     Problem: Safety - Adult  Goal: Free from fall injury  Outcome: Progressing

## 2024-01-26 LAB — POCT GLUCOSE
POC Glucose: 290 mg/dL — ABNORMAL HIGH (ref 70–110)
POC Glucose: 147 mg/dL — ABNORMAL HIGH (ref 70–110)

## 2024-01-26 MED ORDER — POLYETHYLENE GLYCOL 3350 17 G PO PACK
17 | Freq: Every day | ORAL | 0 refills | Status: AC
Start: 2024-01-26 — End: ?

## 2024-01-26 MED ORDER — SERTRALINE HCL 50 MG PO TABS
50 | ORAL_TABLET | Freq: Every day | ORAL | 0 refills | Status: AC
Start: 2024-01-26 — End: ?

## 2024-01-26 MED ORDER — ALBUTEROL SULFATE HFA 108 (90 BASE) MCG/ACT IN AERS
10890 | RESPIRATORY_TRACT | 0 refills | Status: AC | PRN
Start: 2024-01-26 — End: ?

## 2024-01-26 MED ORDER — ATORVASTATIN CALCIUM 40 MG PO TABS
40 | ORAL_TABLET | Freq: Every day | ORAL | 0 refills | Status: AC
Start: 2024-01-26 — End: ?

## 2024-01-26 MED ORDER — ACETAMINOPHEN 325 MG PO TABS
325 | ORAL_TABLET | ORAL | 0 refills | Status: AC | PRN
Start: 2024-01-26 — End: ?

## 2024-01-26 MED ORDER — BUSPIRONE HCL 15 MG PO TABS
15 | ORAL_TABLET | Freq: Every day | ORAL | 0 refills | Status: AC
Start: 2024-01-26 — End: ?

## 2024-01-26 MED ORDER — GABAPENTIN 300 MG PO CAPS
300 | ORAL_CAPSULE | Freq: Every evening | ORAL | 0 refills | Status: AC
Start: 2024-01-26 — End: 2024-02-25

## 2024-01-26 MED ORDER — GLUCOSE 4 G PO CHEW
4 | ORAL_TABLET | ORAL | 0 refills | Status: AC | PRN
Start: 2024-01-26 — End: ?

## 2024-01-26 MED ORDER — FLOVENT HFA 110 MCG/ACT IN AERO
110 | Freq: Every day | RESPIRATORY_TRACT | 0 refills | Status: AC
Start: 2024-01-26 — End: ?

## 2024-01-26 MED ORDER — LANTUS SOLOSTAR 100 UNIT/ML SC SOPN
100 | Freq: Every evening | SUBCUTANEOUS | 0 refills | Status: AC
Start: 2024-01-26 — End: ?

## 2024-01-26 MED ORDER — LAMOTRIGINE 25 MG PO TABS
25 | ORAL_TABLET | Freq: Two times a day (BID) | ORAL | 0 refills | Status: AC
Start: 2024-01-26 — End: ?

## 2024-01-26 MED ORDER — ASPIRIN 81 MG PO TBEC
81 | ORAL_TABLET | Freq: Every day | ORAL | 0 refills | Status: AC
Start: 2024-01-26 — End: ?

## 2024-01-26 MED ORDER — METHOCARBAMOL 500 MG PO TABS
500 | ORAL_TABLET | Freq: Four times a day (QID) | ORAL | 0 refills | Status: AC | PRN
Start: 2024-01-26 — End: 2024-01-31

## 2024-01-26 MED ORDER — INSULIN LISPRO (1 UNIT DIAL) 100 UNIT/ML SC SOPN
100 | Freq: Three times a day (TID) | SUBCUTANEOUS | 0 refills | Status: AC
Start: 2024-01-26 — End: 2024-02-25

## 2024-01-26 MED ORDER — LOSARTAN POTASSIUM 25 MG PO TABS
25 | ORAL_TABLET | Freq: Every day | ORAL | 0 refills | Status: AC
Start: 2024-01-26 — End: ?

## 2024-01-26 MED ORDER — OYSTER SHELL CALCIUM W/D 500-5 MG-MCG PO TABS
500-5- | ORAL_TABLET | Freq: Two times a day (BID) | ORAL | 0 refills | Status: AC
Start: 2024-01-26 — End: ?

## 2024-01-26 MED ORDER — NITROGLYCERIN 0.4 MG SL SUBL
0.4 | ORAL_TABLET | SUBLINGUAL | 0 refills | Status: AC | PRN
Start: 2024-01-26 — End: ?

## 2024-01-26 MED ORDER — PANTOPRAZOLE SODIUM 40 MG PO TBEC
40 | ORAL_TABLET | Freq: Every day | ORAL | 0 refills | Status: AC
Start: 2024-01-26 — End: ?

## 2024-01-26 MED FILL — LAMOTRIGINE 25 MG PO TABS: 25 MG | ORAL | Qty: 1

## 2024-01-26 MED FILL — HUMALOG 100 UNIT/ML IJ SOLN: 100 UNIT/ML | INTRAMUSCULAR | Qty: 2

## 2024-01-26 MED FILL — PANTOPRAZOLE SODIUM 40 MG PO TBEC: 40 MG | ORAL | Qty: 1

## 2024-01-26 MED FILL — ATORVASTATIN CALCIUM 40 MG PO TABS: 40 MG | ORAL | Qty: 1

## 2024-01-26 MED FILL — ACETAMINOPHEN 325 MG PO TABS: 325 MG | ORAL | Qty: 2

## 2024-01-26 MED FILL — BAYER LOW DOSE 81 MG PO TBEC: 81 MG | ORAL | Qty: 1

## 2024-01-26 MED FILL — BUSPIRONE HCL 5 MG PO TABS: 5 MG | ORAL | Qty: 3

## 2024-01-26 MED FILL — OYSTER SHELL CALCIUM W/D 500-5 MG-MCG PO TABS: 500-5- MG-MCG | ORAL | Qty: 1

## 2024-01-26 MED FILL — GABAPENTIN 300 MG PO CAPS: 300 MG | ORAL | Qty: 1

## 2024-01-26 MED FILL — BUDESONIDE 0.5 MG/2ML IN SUSP: 0.52 MG/2ML | RESPIRATORY_TRACT | Qty: 2

## 2024-01-26 MED FILL — LANTUS 100 UNIT/ML SC SOLN: 100 UNIT/ML | SUBCUTANEOUS | Qty: 14

## 2024-01-26 MED FILL — LOSARTAN POTASSIUM 25 MG PO TABS: 25 MG | ORAL | Qty: 1

## 2024-01-26 MED FILL — POLYETHYLENE GLYCOL 3350 17 G PO PACK: 17 g | ORAL | Qty: 1

## 2024-01-26 MED FILL — SERTRALINE HCL 50 MG PO TABS: 50 MG | ORAL | Qty: 1

## 2024-01-26 NOTE — Progress Notes (Signed)
4 Eyes Skin Assessment     NAME:  Colleen [name]  DATE OF BIRTH:  1961/05/13  MEDICAL RECORD NUMBER:  [id number]    The patient is being assessed for  Shift Handoff    I agree that at least one RN has performed a thorough Head to Toe Skin Assessment on the patient. ALL assessment sites listed below have been assessed.      Areas assessed by both nurses:    Head, Face, Ears, Shoulders, Back, Chest, Arms, Elbows, Hands, Sacrum. Buttock, Coccyx, Ischium, Legs. Feet and Heels, and Under Medical Devices         Does the Patient have a Wound? Yes wound(s) were present on assessment. LDA wound assessment was Initiated and completed by RN       Braden Prevention initiated by RN: Yes  Wound Care Orders initiated by RN: No    Pressure Injury (Stage 3,4, Unstageable, DTI, NWPT, and Complex wounds) if present, place Wound referral order by RN under ORDER ENTRY: No    New Ostomies, if present place, Ostomy referral order under ORDER ENTRY: No     Nurse 1 eSignature: Electronically signed by Laverle Hobby, RN on 01/26/24 at 6:31 AM EST    **SHARE this note so that the co-signing nurse can place an eSignature**    Nurse 2 eSignature: Electronically signed by Sondra Barges, RN on 01/26/24 at 10:15 AM EST

## 2024-01-26 NOTE — Progress Notes (Signed)
Colleen [name] 63 y.o. female was discharged home on 01/26/24. Patient's armband removed and shredded. Discharge instructions were reviewed with patient, and she verbalized understanding. The patient was wheeled out to transportation vehicle by a staff member along with the patient's belongings. Transportation was provided by private vehicle.    Cambria Osten, Charity fundraiser

## 2024-01-26 NOTE — Plan of Care (Signed)
Problem: Chronic Conditions and Co-morbidities  Goal: Patient's chronic conditions and co-morbidity symptoms are monitored and maintained or improved  Outcome: Progressing  Flowsheets (Taken 01/25/2024 1905)  Care Plan - Patient's Chronic Conditions and Co-Morbidity Symptoms are Monitored and Maintained or Improved:   Monitor and assess patient's chronic conditions and comorbid symptoms for stability, deterioration, or improvement   Collaborate with multidisciplinary team to address chronic and comorbid conditions and prevent exacerbation or deterioration   Update acute care plan with appropriate goals if chronic or comorbid symptoms are exacerbated and prevent overall improvement and discharge     Problem: Pain  Goal: Verbalizes/displays adequate comfort level or baseline comfort level  Outcome: Progressing     Problem: Neurosensory - Adult  Goal: Achieves maximal functionality and self care  Outcome: Adequate for Discharge  Flowsheets (Taken 01/25/2024 1905)  Achieves maximal functionality and self care: Monitor swallowing and airway patency with patient fatigue and changes in neurological status     Problem: Skin/Tissue Integrity - Adult  Goal: Skin integrity remains intact  Outcome: Adequate for Discharge  Flowsheets (Taken 01/25/2024 1905)  Skin Integrity Remains Intact: Monitor for areas of redness and/or skin breakdown     Problem: Skin/Tissue Integrity - Adult  Goal: Incisions, wounds, or drain sites healing without S/S of infection  Outcome: Adequate for Discharge  Flowsheets (Taken 01/25/2024 1905)  Incisions, Wounds, or Drain Sites Healing Without Sign and Symptoms of Infection:   ADMISSION and DAILY: Assess and document risk factors for pressure ulcer development   TWICE DAILY: Assess and document skin integrity   TWICE DAILY: Assess and document dressing/incision, wound bed, drain sites and surrounding tissue     Problem: Musculoskeletal - Adult  Goal: Return mobility to safest level of function  Outcome:  Adequate for Discharge  Flowsheets (Taken 01/25/2024 1905)  Return Mobility to Safest Level of Function:   Assess patient stability and activity tolerance for standing, transferring and ambulating with or without assistive devices   Assist with transfers and ambulation using safe patient handling equipment as needed   Instruct patient/family in ordered activity level     Problem: Musculoskeletal - Adult  Goal: Maintain proper alignment of affected body part  Outcome: Adequate for Discharge  Flowsheets (Taken 01/25/2024 1905)  Maintain proper alignment of affected body part: Support and protect limb and body alignment per provider's orders     Problem: Musculoskeletal - Adult  Goal: Return ADL status to a safe level of function  Outcome: Adequate for Discharge  Flowsheets (Taken 01/25/2024 1905)  Return ADL Status to a Safe Level of Function:   Administer medication as ordered   Assess activities of daily living deficits and provide assistive devices as needed   Assist and instruct patient to increase activity and self care as tolerated     Problem: Safety - Adult  Goal: Free from fall injury  Outcome: Adequate for Discharge

## 2024-01-26 NOTE — Discharge Summary (Signed)
[facility]  [address], [place], [place] [address]     INPATIENT REHABILITATION  DISCHARGE SUMMARY    Name: Colleen [name] MRN: [id number]   Age / Sex: 63 y.o. / female CSN: [id number]   Date of Birth: [birth year] Length of Stay: 7 days   Admit Date: 01/19/2024 Discharge Date: 01/26/2024       PRIMARY CARE PHYSICIAN: Kerri Perches, PA    DISCHARGE DIAGNOSES:    Primary Rehabilitation Diagnosis   Impaired mobility and ADLs in the setting of cervical spinal stenosis and radiculopathy, status post C3-T2 fusion and C4-6 laminectomy and C3 and C7 dome laminectomy.     CONSULTS CALLED: None    PROCEDURES DONE: None    BRIEF HISTORY: (From the history and physical completed by Dr. Jonah Blue)  This is a 63 year old female with a history of hypertension and diabetes and dyslipidemia who was recently admitted to [facility].  Patient was noted to have cervical spinal stenosis and radiculopathy.  Patient underwent a C3-T2 cervical fusion and C4-6 laminectomy and C3 and C7 dome laminectomy.  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  Postoperatively patient was monitored.  Patient was started on a therapy regimen.  It was felt that patient may benefit from transitioning to the inpatient rehab facility.  For full details regarding hospital course please refer to chart.      The patient  was noted to have impaired mobility and ADLs. Patient was felt to be a good candidate for acute inpatient rehabilitation. Upon evaluation by Physical Therapy and Occupational Therapy, the patient was recommended for acute inpatient rehabilitation. The patient was discharged and was subsequently admitted to the [facility] for Physical Rehabilitation for intensive rehabilitation to help recover strength, function and mobility in the setting of cervical spinal stenosis and radiculopathy, status post C3-T2 cervical  fusion and C4-6 laminectomies and C3 and C7 dome laminectomy.       COURSE IN THE HOSPITAL: Upon admission to the [facility] for Physical Rehabilitation, the patient underwent physical therapy, occupational therapy and speech therapy. The patient was able to actively participate in the rehabilitation activities and progressed well. On discharge, the patient was able to perform the following activities:    1. Occupational Therapy    ON ADMISSION ON DISCHARGE   Eating  Supervision Eating  Feeding: Independent   Grooming  Setup Grooming  Grooming: Modified independent    Bathing  UE Bathing: Stand by assistance Bathing  UE Bathing: Modified independent   LE Bathing: Modified independent , Setup   Upper Body Dressing  Minimal assistance Upper Body Dressing  UE Dressing: Modified independent    Lower Body Dressing  Stand by assistance Lower Body Dressing  LE Dressing: Modified independent    Toileting  Stand by assistance Toileting  Toileting: Modified independent      Toilet Transfers  Stand by assistance   Toilet Transfers  Toilet Transfer: Modified independent   Tub /Shower Transfers  Tub Transfers: Not tested Tub/Shower Transfers  Tub Transfers: Not tested     2. Physical Therapy    ON ADMISSION ON DISCHARGE   Ambulation   Contact guard assistance  Level tile Assistance: Modified Independent  Surface: Level tile   Bed Mobility   Stand by assistance  Stand by assistance Rolling to Right: Independent  Rolling to Left: Independent   Supine to Sit   Contact guard assistance Supine to Sit:  Independent   Sit to Stand   Contact guard assistance Sit to Stand: Modified independent   Bed/Chair Transfers   Contact guard assistance Bed to Chair: Modified independent   Wheelchair assist          Legend:   7 - Independent   6 - Modified Independent   5 - Standby Assistance / Supervision / Set-up   4 - Minimum Assistance / Contact Guard Assistance   3 - Moderate Assistance   2 - Maximum Assistance   1 - Total Assistance /  Dependent       ACUTE MEDICAL ISSUES ADDRESSED IN INPATIENT REHABILITATION FACILITY:     Primary Rehabilitation Diagnosis   Impaired mobility and ADLs in the setting of cervical spinal stenosis and radiculopathy, status post C3-T2 fusion and C4-6 laminectomy and C3 and C7 dome laminectomy     Other Co-Morbid Conditions managed in Rehab      Type 2 diabetes  Hypertension  Dyslipidemia  Anxiety and depression  GERD  Leukocytosis     MEDICAL PLAN:     Impaired mobility and ADLs in the setting of cervical spinal stenosis and radiculopathy, status post C3-T2 fusion and C4-6 laminectomy and C3 and C7 dome laminectomy  Physical therapy and Occupational Therapy  Keep hard cervical collar on at all times  Judicious pain control  Monitor wound     Type 2 diabetes  Continue Lantus 14 units at bedtime  Monitor sugars ACHS  Sliding scale insulin     Hypertension  Continue losartan 50 mg daily  As needed hydralazine  Monitor blood pressure     Dyslipidemia  Continue atorvastatin 40 mg daily     Anxiety and depression  Continue BuSpar 15 mg daily  Continue Zoloft 50 mg daily     GERD  Continue Protonix     Leukocytosis which appears to be improving.  Patient is afebrile and nontoxic.  No history of any cough fever or chills.  Patient denies any nausea or abdominal pain or diarrhea  Monitor intermittently     01/22/24-continue physical therapy and Occupational Therapy.  Monitor wound.  Continue Lantus and sliding scale insulin.  Monitor blood pressure.  Check CBC in the morning.  I discussed care plan with the patient     01/23/24-continue PT OT.  Patient has pico dressing.  Please see APC note regarding conversation with patient's spine surgical team.  Continue Lantus and sliding scale.  Monitor blood pressure.  I discussed care plan with the patient.  Labs reviewed.  Leukocytosis has resolved.  I discussed with the social worker and the therapist     01/24/24-physical therapy and Occupational Therapy.  Lantus and sliding scale.  I  discussed care plan with the patient.  I discussed discharge plans with the patient     01/25/24-PT and OT.  Continue sliding scale insulin and Lantus.  I discussed care plan with the patient.  I discussed discharge plans with the patient.  I discussed with ARU APC.  I discussed with social worker    01/26/2024: Patient is lying in bed eating breakfast in no apparent distress.  Vital signs and labs reviewed.  Pico dressing has been removed and Steri-Strips 4 x 4 gauze and tape have been applied.  Surgical site is clean and dry.  Patient is stable and ready for discharge.     Body mass index is 31.95 kg/m.    MEDICATIONS ON DISCHARGE:       Medication List  START taking these medications      glucose 4 g chewable tablet  Take 4 tablets by mouth as needed for Low blood sugar     insulin lispro (1 Unit Dial) 100 UNIT/ML Sopn  Commonly known as: HumaLOG KwikPen  Inject 0-4 Units into the skin 3 times daily (before meals) Glucose: Dose: 70-179 No Insulin 180-249 1 Unit 250-299 2 Units 300-349 3 Units Over 349 4 Units and notify PCP Administer as soon as possible within 60 minutes of last blood glucose check.     oyster shell calcium w/D 500-5 MG-MCG Tabs tablet  Take 1 tablet by mouth in the morning and at bedtime     pantoprazole 40 MG tablet  Commonly known as: PROTONIX  Take 1 tablet by mouth every morning (before breakfast)  Start taking on: January 27, 2024     polyethylene glycol 17 g packet  Commonly known as: GLYCOLAX  Take 1 packet by mouth daily  Start taking on: January 27, 2024            CHANGE how you take these medications      acetaminophen 325 MG tablet  Commonly known as: TYLENOL  Take 2 tablets by mouth every 4 hours as needed (Pain Mild (1-3) or Fever greater than 100.5 F (38 C))  What changed:   medication strength  how much to take  when to take this  reasons to take this     albuterol sulfate HFA 108 (90 Base) MCG/ACT inhaler  Commonly known as: PROVENTIL;VENTOLIN;PROAIR  Inhale 2 puffs into the  lungs every 4 hours as needed for Wheezing or Shortness of Breath  What changed: reasons to take this     atorvastatin 40 MG tablet  Commonly known as: LIPITOR  Take 1 tablet by mouth daily  Start taking on: January 27, 2024  What changed: See the new instructions.     busPIRone 15 MG tablet  Commonly known as: BUSPAR  Take 15 mg by mouth daily  Start taking on: January 27, 2024  What changed: See the new instructions.     Flovent HFA 110 MCG/ACT inhaler  Generic drug: fluticasone  Inhale 1 puff into the lungs Daily  What changed:   how much to take  how to take this  when to take this     gabapentin 300 MG capsule  Commonly known as: NEURONTIN  Take 1 capsule by mouth nightly for 30 days. Max Daily Amount: 300 mg  What changed:   when to take this  Another medication with the same name was removed. Continue taking this medication, and follow the directions you see here.     lamoTRIgine 25 MG tablet  Commonly known as: LAMICTAL  Take 1 tablet by mouth 2 times daily  What changed: See the new instructions.     Lantus SoloStar 100 UNIT/ML injection pen  Generic drug: insulin glargine  Inject 14 Units into the skin nightly  What changed: See the new instructions.     losartan 25 MG tablet  Commonly known as: COZAAR  Take 1 tablet by mouth daily  Start taking on: January 27, 2024  What changed:   medication strength  how much to take     methocarbamol 500 MG tablet  Commonly known as: ROBAXIN  Take 1 tablet by mouth every 6 hours as needed (Muscle Spasm)  What changed:   medication strength  See the new instructions.     nitroGLYCERIN 0.4 MG SL tablet  Commonly known as: NITROSTAT  Place 1 tablet under the tongue every 5 minutes as needed for Chest pain up to max of 3 total doses. If no relief after 1 dose, call 911.Place 1 tablet under tongue upon chest pain, wait 5 minutes and may repeat up to 3 doses in 15 minutes. Do not crush or break.  What changed:   when to take this  reasons to take this  additional  instructions     sertraline 50 MG tablet  Commonly known as: ZOLOFT  Take 1 tablet by mouth daily  Start taking on: January 27, 2024  What changed: See the new instructions.            CONTINUE taking these medications      aspirin 81 MG EC tablet  Take 1 tablet by mouth daily  Start taking on: January 27, 2024     diclofenac sodium 1 % Gel  Commonly known as: VOLTAREN     Droplet Pen Needles 32G X 4 MM Misc  Generic drug: Insulin Pen Needle     FREESTYLE LITE strip  Generic drug: blood glucose test strips     nystatin 100000 UNIT/GM cream  Commonly known as: MYCOSTATIN            STOP taking these medications      acetaminophen-codeine 300-30 MG per tablet  Commonly known as: TYLENOL #3     Compound W Maximum Strength 17 % gel  Generic drug: salicylic acid     dicyclomine 20 MG tablet  Commonly known as: BENTYL     doxycycline hyclate 100 MG tablet  Commonly known as: VIBRA-TABS     Exenatide 2 MG Pen     famotidine 20 MG tablet  Commonly known as: PEPCID     ibuprofen 600 MG tablet  Commonly known as: ADVIL;MOTRIN     ibuprofen 800 MG tablet  Commonly known as: ADVIL;MOTRIN     isosorbide mononitrate 30 MG extended release tablet  Commonly known as: IMDUR     Janumet XR 50-1000 MG Tb24 per extended release tablet  Generic drug: SITagliptin-metFORMIN     Janumet XR 50-500 MG Tb24 per extended release tablet  Generic drug: SITagliptin-metFORMIN     Lidocaine 1.8 % Ptch     lidocaine 5 %  Commonly known as: LIDODERM     losartan-hydroCHLOROthiazide 50-12.5 MG per tablet  Commonly known as: HYZAAR     meclizine 25 MG tablet  Commonly known as: ANTIVERT     meloxicam 7.5 MG tablet  Commonly known as: MOBIC     metFORMIN 500 MG tablet  Commonly known as: GLUCOPHAGE     naproxen 500 MG tablet  Commonly known as: NAPROSYN     predniSONE 20 MG tablet  Commonly known as: DELTASONE     prochlorperazine 10 MG tablet  Commonly known as: COMPAZINE     repaglinide 2 MG tablet  Commonly known as: PRANDIN     Trulicity 0.75  MG/0.5ML Soaj SC injection  Generic drug: dulaglutide     Trulicity 0.75 MG/0.5ML Sopn SC injection     Trulicity 1.5 MG/0.5ML SC injection               Where to Get Your Medications        These medications were sent to [facility] [place], [place] - [address] [place] - P [phone] Carmon Ginsberg [phone]  [address] [place], [place] [address]  Phone: [phone]   acetaminophen 325 MG tablet  albuterol sulfate HFA 108 (90 Base) MCG/ACT inhaler  aspirin 81 MG EC tablet  atorvastatin 40 MG tablet  busPIRone 15 MG tablet  Flovent HFA 110 MCG/ACT inhaler  gabapentin 300 MG capsule  glucose 4 g chewable tablet  insulin lispro (1 Unit Dial) 100 UNIT/ML Sopn  lamoTRIgine 25 MG tablet  Lantus SoloStar 100 UNIT/ML injection pen  losartan 25 MG tablet  methocarbamol 500 MG tablet  nitroGLYCERIN 0.4 MG SL tablet  oyster shell calcium w/D 500-5 MG-MCG Tabs tablet  pantoprazole 40 MG tablet  polyethylene glycol 17 g packet  sertraline 50 MG tablet         DISCHARGE VITAL SIGNS:  Vitals:    01/26/24 0730   BP: 117/64   Pulse: 70   Resp: 17   Temp: 98.8 F (37.1 C)   SpO2: 97%       DISCHARGE PHYSICAL EXAMINATION:  General:  Awake, alert  Cardiovascular:  S1S2+, RRR  Pulmonary:  CTA b/l  GI:  Soft, BS+, NT, ND  Extremities:  No edema  Right upper extremity weakness    CONDITION ON DISCHARGE: Stable.    DISPOSITION: Patient clinically improved and was discharged home with outpatient physical therapy, occupational therapy and speech therapy     FOLLOW-UP RECOMMENDATIONS:      Follow-up Information     Kerri Perches, PA Follow up on 02/08/2024.    Specialty: Physician Assistant  Why: Patient has a follow up appointment scheduled on 02/08/24 @ 8:55 AM   arrival, 9:15am appointment.  Contact information:  [address]  [address] [place] [address] [place] [address]  [phone]             Shari Heritage, MD Follow up on 02/09/2024.    Specialty: Orthopedic Surgery  Why: Patient has a follow up  appointment scheduled on 02/09/2024 @ 11:00 AM   at [facility].  Contact information:  [address]  [place] [address]  [phone]             [facility] Follow up.    Why: PT/OT/SLP: - Patient will need authorization from insurance before   scheduling outpatient therapy appointments. Patient will recieve   authorization from [facility] PCP.  Contact information:  [address]  [address]  [place], [place]. [address]  Phone: [phone]  Fax: [phone]                      OTHER INSTRUCTIONS:    1. CODE STATUS AT DISCHARGE: FULL CODE   2. Diet: ADULT DIET; Regular; 4 carb choices (60 gm/meal); Low Fat/Low Chol/High Fiber/2 gm Na.   3. Activity. As tolerated.  4. Safety / Cervical precautions : Aspen Collar, NO Bending, lifting, or turning of neck. No lifting over 10 lbs.       TIME SPENT ON DISCHARGE ACTIVITIES: 39 minutes.      Signed:  Sande Rives, APRN - CNP    01/26/2024

## 2024-01-26 NOTE — Progress Notes (Signed)
Prior to discharge, nurse practitioner discussed and went through current and discharge medications in detail with the patient at the bedside. The NP discussed which drugs to discontinue, resume, and continue after discharge. NP explained and answered questions about follow-up care/pcp/specialist appointments, as well as discharge process expectations. The patient expressed her understanding verbally. Patient will be discharged with home health, PT/OT. NP discussed discharge process with discharge RN.     Lesle Reek DNP, NP-BC

## 2024-01-26 NOTE — Progress Notes (Signed)
Chaplain conducted an initial consultation and Spiritual Assessment for Colleen [name], who is a 62 y.o.,female. Patient's Primary Language is: [place].   According to the patient's EMR Religious Affiliation is: Baptist.     The Chaplain provided the following Interventions:  Initiated a relationship of care and support to the patient in bed 175 of the rehab unit where she asked to complete an advance directive form today before she was discharged.  Explored issues of faith, belief, spirituality and religious/ritual needs while hospitalized. Patient is active in a local faith/ spiritual community.  Listened empathically. Advance directive now on file.  Offered prayer and assurance of continued prayers on patients behalf.     The following outcomes were achieved:  Patient shared limited information about her medical narrative and spiritual journey/beliefs.    Spiritual Health History and Assessment/Progress Note  [facility]    Advance Care Planning (iv-sa-eje), Follow up,  ,      Name: Colleen [name] MRN: [id number]    Age: 63 y.o.     Sex: female   Language: English   Religion: Baptist   Spinal stenosis     Date: 01/26/2024            Total Time Calculated: 15 min              Spiritual Assessment began in [facility] 1 IP REHAB UNIT        Referral/Consult From: Rounding   Encounter Overview/Reason: Advance Care Planning (iv-sa-eje)  Service Provided For: Patient    Faith, Belief, Meaning:   Patient identifies as spiritual and is connected with a faith tradition or spiritual practice  Family/Friends are connected with a faith tradition or spiritual practice      Importance and Influence:  Patient has spiritual/personal beliefs that influence decisions regarding their health  Family/Friends have spiritual/personal beliefs that influence decisions regarding the patient's health    Community:  Patient feels well-supported. Support system includes: Children  Family/Friends No family/friends present    Assessment and Plan  of Care:     Patient Interventions include: Facilitated expression of thoughts and feelings  Family/Friends Interventions include: No family/friends present    Patient Plan of Care: Spiritual Care available upon further referral  Family/Friends Plan of Care: No family/friends present    Electronically signed by Charmaine Downs., [facility] on 01/26/2024 at 11:02 AM  Advance Care Planning     Advance Care Planning Inpatient Note  Spiritual Care Department    Today's Date: 01/26/2024  Unit: [facility] 1 IP REHAB UNIT    Received request from .  Upon review of chart and communication with care team, patient's decision making abilities are not in question.. Patient was/were present in the room during visit.    Goals of ACP Conversation:  Discuss advance care planning documents    Health Care Decision Makers:       Primary Decision MakerNerea, [name] [name] [name]    Secondary Decision Maker: Colleen, [name] [name] [phone]  Summary:  Completed New Documents    Advance Care Planning Documents (Patient Wishes):  Healthcare Power of Attorney/Advance Directive Appointment of Health Care Agent  Living Will/Advance Directive  Anatomical Gift/Organ Donation     Assessment:  Patient completed an advance directive today with copies made and given to patient. Information was charted as well.    Interventions:  Assisted in the completion of documents according to patient's wishes at this time    Care Preferences Communicated:   No  Outcomes/Plan:  New advance directive completed.    Electronically signed by Charmaine Downs., [facility] on 01/26/2024 at 11:03 AM

## 2024-01-26 NOTE — Progress Notes (Signed)
Sw delivered rolling walker from [employer].     No further needs are noted at this time.

## 2024-01-26 NOTE — Plan of Care (Signed)
Problem: Chronic Conditions and Co-morbidities  Goal: Patient's chronic conditions and co-morbidity symptoms are monitored and maintained or improved  01/26/2024 1003 by Trilby Leaver, Shuree Brossart, RN  Outcome: Progressing  01/26/2024 0202 by Laverle Hobby, RN  Outcome: Progressing  Flowsheets (Taken 01/25/2024 1905)  Care Plan - Patient's Chronic Conditions and Co-Morbidity Symptoms are Monitored and Maintained or Improved:   Monitor and assess patient's chronic conditions and comorbid symptoms for stability, deterioration, or improvement   Collaborate with multidisciplinary team to address chronic and comorbid conditions and prevent exacerbation or deterioration   Update acute care plan with appropriate goals if chronic or comorbid symptoms are exacerbated and prevent overall improvement and discharge     Problem: Pain  Goal: Verbalizes/displays adequate comfort level or baseline comfort level  01/26/2024 1003 by Trilby Leaver, Lleyton Byers, RN  Outcome: Progressing  01/26/2024 0202 by Laverle Hobby, RN  Outcome: Progressing     Problem: Neurosensory - Adult  Goal: Achieves maximal functionality and self care  01/26/2024 1003 by Sondra Barges, RN  Outcome: Progressing  01/26/2024 0202 by Laverle Hobby, RN  Outcome: Adequate for Discharge  Flowsheets (Taken 01/25/2024 1905)  Achieves maximal functionality and self care: Monitor swallowing and airway patency with patient fatigue and changes in neurological status     Problem: Skin/Tissue Integrity - Adult  Goal: Skin integrity remains intact  01/26/2024 1003 by Sondra Barges, RN  Outcome: Progressing  01/26/2024 0202 by Laverle Hobby, RN  Outcome: Adequate for Discharge  Flowsheets (Taken 01/25/2024 1905)  Skin Integrity Remains Intact: Monitor for areas of redness and/or skin breakdown  Goal: Incisions, wounds, or drain sites healing without S/S of infection  01/26/2024 1003 by Trilby Leaver, Darrell Hauk, RN  Outcome: Progressing  01/26/2024 0202 by Laverle Hobby,  RN  Outcome: Adequate for Discharge  Flowsheets (Taken 01/25/2024 1905)  Incisions, Wounds, or Drain Sites Healing Without Sign and Symptoms of Infection:   ADMISSION and DAILY: Assess and document risk factors for pressure ulcer development   TWICE DAILY: Assess and document skin integrity   TWICE DAILY: Assess and document dressing/incision, wound bed, drain sites and surrounding tissue     Problem: Musculoskeletal - Adult  Goal: Return mobility to safest level of function  01/26/2024 1003 by Sondra Barges, RN  Outcome: Progressing  01/26/2024 0202 by Laverle Hobby, RN  Outcome: Adequate for Discharge  Flowsheets (Taken 01/25/2024 1905)  Return Mobility to Safest Level of Function:   Assess patient stability and activity tolerance for standing, transferring and ambulating with or without assistive devices   Assist with transfers and ambulation using safe patient handling equipment as needed   Instruct patient/family in ordered activity level  Goal: Maintain proper alignment of affected body part  01/26/2024 1003 by Sondra Barges, RN  Outcome: Progressing  01/26/2024 0202 by Laverle Hobby, RN  Outcome: Adequate for Discharge  Flowsheets (Taken 01/25/2024 1905)  Maintain proper alignment of affected body part: Support and protect limb and body alignment per provider's orders  Goal: Return ADL status to a safe level of function  01/26/2024 1003 by Sondra Barges, RN  Outcome: Progressing  01/26/2024 0202 by Laverle Hobby, RN  Outcome: Adequate for Discharge  Flowsheets (Taken 01/25/2024 1905)  Return ADL Status to a Safe Level of Function:   Administer medication as ordered   Assess activities of daily living deficits and provide assistive devices as needed   Assist and instruct patient to increase activity and self care as tolerated     Problem: Safety - Adult  Goal: Free from  fall injury  01/26/2024 1003 by Sondra Barges, RN  Outcome: Progressing  01/26/2024 0202 by Laverle Hobby, RN  Outcome:  Adequate for Discharge

## 2024-03-22 ENCOUNTER — Encounter: Payer: TRICARE (CHAMPUS) | Primary: Surgical

## 2024-03-22 ENCOUNTER — Inpatient Hospital Stay: Admit: 2024-03-22 | Payer: TRICARE (CHAMPUS) | Primary: Surgical

## 2024-03-22 DIAGNOSIS — M4800 Spinal stenosis, site unspecified: Secondary | ICD-10-CM

## 2024-03-22 NOTE — Progress Notes (Signed)
 PT DAILY TREATMENT/CERVICAL EVALUATION    Patient Name: Colleen [name]    Date: 03/22/2024    DOB: [birth year]  Insurance: Payor: [employer]ARE [employer] / Plan: [employer] [place] PRIME / Product Type: *No Product type* /      Patient DOB verified yes     Visit #   Current / Total 1 8-24   Time   In / Out 1040 1120   Pain   In / Out 0 0   Subjective Functional Status/Changes: See Below   Changes to:  Meds, Allergies, Med Hx, Sx Hx?  If yes, update Summary List no  - See Chart     Treatment Area: Spinal stenosis, site unspecified [M48.00]    SUBJECTIVE    [x] constant [x] intermittent [] improving [] worsening [] no change since onset    Mechanism of Injury: S/p Cr-T2 fusion and C4-C6 laminectomy, C3 and C7 laminecyomy on 01/11/24, Dc'd from hospital 01/20/24. "Wrong people picked up the referral for Speech/OT/PT", but had cancelled them. Attempted to schedule at [facility] but the clinic was closing. Had 8 hours in operating room prone, advised to do all 3 disciplines.     Current symptoms/Complaints: In cervical collar per MD order, see precautions. Advised patient to consult physician in her protocol.  Head feels very heavy when she takes collar off to eat.     Reports in intermittent stabbing pain when moving. Weakness to right UE.     Symptoms:  Aggravated by: (Precautions prevent these aggravating movements)   []  Bending []  Sitting []  Turning []  Being Still   []  Moving []  Cough []  Sneeze []  Valsalva   []  AM  []  PM  Lying:  []  sup   []  pro   []  sidelying   []  Other: lying, turning over, restricted C/S ROM     Eased by:    []  Bending []  Sitting []  Standing []  Turning   []  Moving []  AM  []  PM  Lying: []  sup  []  pro  []  sidelying   []  Other: using collar    Continued use makes the pain:  []  Better []   Worse []   No effect     Pain at rest: []  No    [x]  Yes       PMHx/Surgical Hx: C/S radiculopathy, Diabetes 2, Hyperlipidemia, LBP, Diabetic Peripheral Neuropathy, GERD, Anxiety, Bipolar disorder    Diagnostic Tests: []  Lab work []  X-rays    []   CT []  MRI     []  Other:  Results:    Work Hx: previously Teacher, adult education, 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 Lives on second floor.     Pt Goals: "Be able to move my head and get out of the collar"    Barriers: [] pain [] financial [] time [] transportation [] Other    Disturbed night?: [x]  No    []  Yes      Sleep Position: supine in collar    Activity/Recreational Limitations: unable to bowl.     Other:    OBJECTIVE/EXAMINATION    [x]   Patient Education billed concurrently with other procedures     General Health:  Red Flags Indicated? []  Yes    []  No  []  Yes []  No Recent weight change (If yes, due to dieting? []  Yes  []  No)   []  Yes []  No Persistent cough  []  Yes []  No Unremitting pain at night  []   Yes []  No Dizziness  []  Yes []  No Blurred vision  []  Yes []  No Hands more cold or painful in cold weather  []  Yes []  No Ringing in ears  []  Yes []  No Difficulty swallowing  []  Yes []  No Dysfunction of bowel or bladder  []  Yes []  No Recent illness within past 3 weeks (i.e, cold, flu)  []  Yes []  No Jaw pain    Posture: []  WNL  Head Position:  Shoulder/Scapular Position:  C-Kyphosis:  []  increased   []  decreased   C-Lordosis:   []  increased   []  decreased  T-Kyphosis:  []  increased   []  decreased  T-Lordosis:   []  increased   []  decreased     TMJ: []  N/A []  Abnormal - ROM:   Palpation:    Cervical Retraction: []  WNL    []  Abnormal:    Shoulder/Scapular Screen: []  WNL    []  Abnormal:    Active Movements: []  N/A   []  Too acute   []  Other:  Cervical ROM Loss Maj Mod Min Nil   Protrusion x      Flexion 5      Retraction x      Extension 2      Lateral Flexion R 2      Lateral Flexion L 2      Rotation R 3      Rotation L 5        Neurological (upper):   Myotome Level Muscle Test Nerve Reflex Sensation   C5 Deltoid (C5&C6) Axillary N/A Acromion to Lateral Epicondyle     Biceps (C5&C6) Musculocutaneous Biceps Tendon Purest patch at the axillary nerve at the middle deltoid    Rhomboid C5 Dorsal Scapular      Supraspinatus & Infraspinatus (C5&C6) Suprascapular      Teres Minor (C5&C6) Axillary     C6 Wrist Extensor Group (C6&C7) Radial Brachioradialis Lateral Forearm and the "6" with the fingers    ECRL/ECRB Radial      Supinator Deep Branch Radial or PIN      ECU (C6&C7) Posterior Interosseus     C7 Triceps (C7&C8) Radial Triceps Middle Finger    Extensor Indicis C6/7/8 Posterior Interosseus      FCR C7 Median     C8 Abductor Policis Brevis (C8&T1) Median Nerve Recurrent Branch N/A Medial Forearm    Flexor Digitorum Superficialis C8 Median     T1 Dorsal Interossei T1 Ulnar Nerve N/A Medial Upper Arm    Abductor Digiti Quinti T1 Ulnar Nerve       Notable Deficits from above: []  WNL  Gross weakness to BUE, right worse than left.     16 min [x] Eval  - untimed                Therapeutic Procedures:  Tx Min Billable or 1:1 Min (if diff from Tx Min) Procedure, Rationale, Specifics   8  97710 Therapeutic Exercise (timed):  increase ROM, strength, coordination, balance, and proprioception to improve patient's ability to progress to PLOF and address remaining functional goals. (see flow sheet as applicable)     Details if applicable:       8  97530 Therapeutic Activity (timed):  use of dynamic activities replicating functional movements to increase ROM, strength, coordination, balance, and proprioception in order to improve patient's ability to progress to PLOF and address remaining functional goals.  (see flow sheet as applicable)     Details if applicable:     8  29562 Self Care/Home Management (timed):  improve patient knowledge and understanding of home injury/symptom/pain management, positioning, posture/ergonomics, home safety, activity modification, transfer techniques, and joint protection strategies  to improve patient's ability to progress to PLOF and address remaining functional  goals.  (see flow sheet as applicable)     Details if applicable:       [facility] BC Totals Reminder: bill using total billable min of TIMED therapeutic procedures (example: do not include dry needle or estim unattended, both untimed codes, in totals to left)  8-22 min = 1 unit; 23-37 min = 2 units; 38-52 min = 3 units; 53-67 min = 4 units; 68-82 min = 5 units   Total Total     [x]   Patient Education billed concurrently with other procedures   [x]  Review HEP    []  Progressed/Changed HEP  []  Other:    ASSESSMENT/PLAN    Patient will continue to benefit from skilled PT services to modify and progress therapeutic interventions, analyze and address functional mobility deficits, analyze and address ROM deficits, analyze and address strength deficits, analyze and address soft tissue restrictions, analyze and cue for proper movement patterns, analyze and modify for postural abnormalities, and instruct in home and community integration to address functional deficits and attain remaining goals.    [x]   See Plan of Care - for goals and assessment     Colleen [name] "BJ" Anysia Choi, DPT, Cert. MDT, Cert. DN, Cert. SMT, Dip. Osteopractic   03/22/2024  10:46 AM    Justification for Eval Code Complexity:  Patient History : high  Examination see exam   Clinical Presentation: low  Clinical Decision Making : NDI   If an interpreting service was utilized for treatment of this patient, the contents of this document represent the material reviewed with the patient via the interpreter.

## 2024-03-22 NOTE — Progress Notes (Signed)
 [facility] - [facility] PHYSICAL THERAPY  [address] [place], [address] [phone] Fx: [phone]  Plan of Care / Statement of Necessity for Occupational Therapy Services     Patient Name: Colleen [name] DOB: 06-08-1961   Medical   Diagnosis: Spinal stenosis, site unspecified [M48.00] Treatment Diagnosis: M62.81  GENERAL MUSCLE WEAKNESS      Onset Date: 01/11/24 Payor :  Payor: [employer]ARE EAST / Plan: [employer]ARE EAST PRIME / Product Type: *No Product type* /    Referral Source: PCP: Kerri Perches, PA  Referring: Sande Rives* Start of Care [facility]): 03/22/2024   Prior Hospitalization: See medical history Provider #: [phone]   Prior Level of Function: previously massage therapist, 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 female who presents to evaluation for the bilateral Ues s/p C3-T2 fusion and C4-6 laminectomy and C3 and C7 dome laminectomy on 01/11/24. Pt completed 7 days in IPR and upon discharge, pt assessed to be independent-mod I for ADLs. Patient reports 2/10 pain rating along with chief c/o itching of the forearm, recent onset weakness of the right side, and numbness at the wrist level of the right hand after IV placements. Pt reports that she didn't feel the right side was weak immediate after surgery and but more recently. Pt reporting "achiness" in the right arm and shoulder. Pt reporting no diffiuclty with ADLs at home, uses adaptive equipment as needed. Pt with decreased right shoulder abduction and IR noted. Pt with decreased strength of the right shoulder, elbow, and  forearm compared to the left with MMT.  Pt able to complete full extension and flexion of digits complete full opposition. Pt is limited to 44#p! of right hand grip strength versus 71# for the left hand. Pt with right hand pinches ranging from 5-13# versus 6-14# for the left hand. Pt's FM skills are WFL bilaterally.     Pt will benefit from skilled OT services to address aforementioned functional deficits, maximize functional abilities of the bilateral UEs, promoting overall increased independence and quality of life.      Objective:    Range of Motion:     Active     Norms Right Left   Shoulder Flex 0-180      Ext 0-60      abd 0-180 87p! WFL    IR 0-70 10p! [facility]    ER 0-90 [facility] WFL     Strength:    Right Left   Shoulder Flex 3+p! 4+    Ext 3+p! 4+    abd 2+ 4+   Elbow Ext/flex 4 4+   Forearm Supination 4 5    Pronation 4 5   Wrist Flex 5 5    Ext 5 5    Radial Dev 5 5    Ulnar Dev 5 5     Hand ROM: WFL     Hand Strength: Taken with Jamar Dynamometer, in Lbs   Level 2 Eval DATE Change   Right 44p!     Left 71          Pinch Strength: Taken with Pinch Dynamometer, in Lbs    Eval  Date Change   Right 3pt 6      Lateral 13      Tip 5        Left 3pt 11      Lateral 14      Tip 6       Nine-Hole Peg Test    Eval DATE   Right 20    Left 23      Finger Opposition: WFL     Palpation: tenderness at right trap       Evaluation Complexity:  History:  MEDIUM  Complexity : 1-2 comorbidities / personal factors will impact the outcome/ POC ; Examination:  MEDIUM Complexity : 3 Standardized tests and measures addressin body structure, function, activity limitation and / or participation in recreation  ;Presentation:  MEDIUM Complexity : Evolving with changing characteristics  ;Clinical Decision Making: QuickDASH: Disability Arm, Shoulder, Hand = 39 % ; (0% - 40% Normal to Mild Disability) = LOW Complexity  Overall Complexity Rating: LOW   Problem List: pain affecting function, decrease ROM, decrease strength, decrease ADL/functional  abilities, decrease activity tolerance, and decrease flexibility/joint mobility   Treatment Plan may include any combination of the following: 52841 Therapeutic Exercise, 97112 Neuromuscular Re-Education, 97140 Manual Therapy, 97530 Therapeutic Activity, 97535 Self Care/Home Management, 97014 Electrical Stim unattended / [phone] [facility]), [phone] Electrical Stim attended, [phone] Vasopneumatic Device  (Vasopnuematic compression justification:  Per bilateral girth measures taken and listed above the edema is considered significant and having an impact on the patient's self care and ADL's), 97035 Ultrasound, 40347 Orthotic Management and Training, 42595 Orthotic Management and Training, Subsequent, 97018 Paraffin Bath, and [phone] Whirlpool Bath or Fluidized Therapy   Patient / Family readiness to learn indicated by: asking questions, trying to perform skills, interest, return verbalization , and return demonstration   Persons(s) to be included in education: patient (P) and family support person (FSP); list daughters and brothers  Barriers to Learning/Limitations: none  Measures taken if barriers to learning present: N/A  Patient Goal (s): "improved mobility."  Patient Self Reported Health Status: good  Rehabilitation Potential: good    Short Term Goals: To be accomplished in 2 weeks  Patient will be independent with HEP for bilateral UE ROM/stretches/strengthening to increase ROM and strength for ADL/IADL tasks.   Status at Eval: food putty item HEP with medium soft putty     Patient will be independent in demonstrating and performing activity modification strategies to aid in success for work, self-care, meal preparation and home management tasks.   Status at Eval: Not initiated      Long Term Goals: To be accomplished in 4-6 weeks  Patient will show a 10% or more improvement in QuickDASH score, demonstrating improve function to complete ADLs/IADLs.  Status at Eval:    Pt will increase right shoulder abduction and IR by 15 deg  or greater in order to return to PLOF.   Status at eval: abduction= 87 deg; IR= 10 degp!    Pt will increase strength of Right  UEs by 1/2 MMT grade or more in order to be able to push up from a chair.   Status at Eval: see MMT chart    Patient will increase Right hand grip strength to 55# or greater without increased pain to improve grasp on objects during ADL/IADL tasks.   Status at Eval: 44#p!    Patient will increase  Right hand 3pt, lateral, and tip pinch strength by 3 pounds or greater to improve participation in meal  preparation and home management tasks.  Status at Eval: 3pt= 6#; lateral= 13#; tip= 5#      Frequency / Duration: Patient to be seen 2 times per week for 4-6 weeks    Patient/ Caregiver education and instruction: Diagnosis, prognosis, self care, activity modification, brace/ splint application, and exercises [x]   Plan of care has been reviewed with Roxan Hockey, OT       03/22/2024       7:42 AM  ===================================================================  I certify that the above Therapy Services are being furnished while the patient is under my care. I agree with the treatment plan and certify that this therapy is necessary.    Physician's Signature:_________________________   DATE:_________   TIME:________                         Kerri Perches, PA    ** Signature, Date and Time must be completed for valid certification **  Please sign and return to [facility] or you may fax the signed copy to [phone].  Thank you.

## 2024-03-22 NOTE — Progress Notes (Signed)
 OCCUPATIONAL THERAPY - DAILY TREATMENT NOTE    Patient Name: Colleen [name]    Date: 03/22/2024    DOB: [birth year]  Insurance: Payor: TRICARE EAST / Plan: [employer]ARE [employer]AST PRIME / Product Type: *No Product type* /        Ins Auth:  15 visits expires 05/22/24   Next PN Due By:  04/19/24                        Patient DOB verified Yes     Visit #   Current / Total 1 12   Time   In / Out 1010 1040   Total Treatment Time 30   Pain   In / Out 2 2   Subjective Functional Status/Changes: See POC     TREATMENT AREA =  Spinal stenosis, site unspecified [M48.00]    OBJECTIVE    EVAL - 20 minutes    Therapeutic Procedures:  Tx Min Billable or 1:1 Min (if diff from Tx Min) Procedure, Rationale, Specifics        10  97535 Self Care/Home Management (timed):  improve patient knowledge and understanding of home injury/symptom/pain management and positioning  to increase ability to complete ADLs/IADLs independently  (see flow sheet as applicable)    Education on dermatomes  Food putty item HEP with medium soft putty                       TOTAL TREATMENT TIME:  (Total Time - Paraffin/Vaso/Fluido)        10       Billed concurrently with other treatments Patient Education:  Reviewed HEP     Objective Information/Functional Measures/Assessment    See POC         Assessment/Plan:    See POC             []   See Progress Note/Re certification     Patient will continue to benefit from skilled OT services to modify and progress therapeutic interventions, analyze and address functional mobility deficits, analyze and address ROM deficits, analyze and address strength deficits, analyze and address soft tissue restrictions, analyze and cue for proper movement patterns, and instruct in home and community integration  to attain remaining goals.   If an interpreting service was utilized for treatment of this patient, the contents of this document represent the material reviewed with the patient via the interpreter.   Progress toward goals / Updated  goals:    Patient will be independent with HEP for bilateral UE ROM/stretches/strengthening to increase ROM and strength for ADL/IADL tasks.   Status at Eval: food putty item HEP with medium soft putty      Patient will be independent in demonstrating and performing activity modification strategies to aid in success for work, self-care, meal preparation and home management tasks.   Status at Eval: Not initiated        Long Term Goals: To be accomplished in 4-6 weeks  Patient will show a 10% or more improvement in QuickDASH score, demonstrating improve function to complete ADLs/IADLs.  Status at Eval: 39%     Pt will increase right shoulder abduction and IR by 15 deg or greater in order to return to PLOF.   Status at eval: abduction= 87 deg; IR= 10 degp!     Pt will increase strength of Right  UEs by 1/2 MMT grade or more in order to be able to push up from a  chair.   Status at Eval: see MMT chart     Patient will increase Right hand grip strength to 55# or greater without increased pain to improve grasp on objects during ADL/IADL tasks.   Status at Eval: 44#p!     Patient will increase  Right hand 3pt, lateral, and tip pinch strength by 3 pounds or greater to improve participation in meal preparation and home management tasks.  Status at Eval: 3pt= 6#; lateral= 13#; tip= 5#     PLAN  [x]   Continue Plan of Care  []   Upgrade activities as tolerated    []   Discharge due to :    []   Other:      Therapist: Alric Ran, OTR/L    Date: 03/22/2024 Time: 11:42 AM     No future appointments.

## 2024-03-22 NOTE — Progress Notes (Cosign Needed)
 [place] [facility] PHYSICAL THERAPY  [address] [place], [place] [address] [phone] Fx: [phone]  Plan of Care / Statement of Necessity for Physical Therapy Services     Patient Name: Colleen [name] DOB: 09-02-1961   Medical   Diagnosis: Spinal stenosis, site unspecified [M48.00] Treatment Diagnosis: M54.2  NECK PAIN      Onset Date: DOS 01/11/24 Payor :  Payor: [employer]ICARE [employer]AST / Plan: [employer]ARE [employer]AST PRIME / Product Type: *No Product type* /    Referral Source: Sande Rives* Start of Care [facility]): 03/22/2024   Prior Hospitalization: See medical history Provider #: [id number]   Prior Level of Function: Independent with chronic neck pain and radiculopathy   Comorbidities:  C/S radiculopathy, Diabetes 2, Hyperlipidemia, LBP, Diabetic Peripheral Neuropathy, GERD, Anxiety, Bipolar disorder     Assessment / key information: Patient is a 63 y.o. female presenting s/p C/S multilevel fusion in hard cervical collar. She denies any current pain in resting, and reports compliance towards her protocol (aside from carrying her 11lb purse which she was unaware of the weight at the time). Patient reports heaviness/weakness in right UE and difficulty holding her head up to eat. She demonstrates restricted ROM in all planes, as to be expected after such surgery. Pt  will benefit from physical therapist management to address her impairments (listed below),  educate her, and improve her level of function. Thanks for your referral.     Evaluation Complexity:  History:  HIGH Complexity :3+ comorbidities / personal factors will impact the outcome/ POC ; Examination:  LOW Complexity : 1-2 Standardized tests and measures addressing body structure, function, activity limitation and / or participation in recreation  ;Presentation:  LOW Complexity : Stable, uncomplicated  ;Clinical Decision Making: Neck Disability Index (NDI) = 40 % ; (30% - 48% Moderate Disability) = MODERATE Complexity  Overall  Complexity Rating: LOW   Problem List: pain affecting function, decrease ROM, decrease strength, decrease ADL/functional abilities, decrease activity tolerance, decrease flexibility/joint mobility, and decrease transfer abilities    Treatment Plan may include any combination of the following: 46962 Therapeutic Exercise, 97112 Neuromuscular Re-Education, 97140 Manual Therapy, 97530 Therapeutic Activity, 97535 Self Care/Home Management, and 97014 Electrical Stim unattended / [phone] [facility])  Patient / Family readiness to learn indicated by: asking questions, trying to perform skills, interest, return verbalization , and return demonstration   Persons(s) to be included in education: patient (P)  Barriers to Learning/Limitations: None   Measures taken if barriers to learning present: Emphasis on HEP progression  Patient Goal (s): "Move my head again with less pain"  Patient Self Reported Health Status: good  Rehabilitation Potential: good    Short Term Goals: To be accomplished in 4 weeks  Patient to be adherent to HEP to facilitate pain control with ADL's.  -Status at IE- HEP initiated.  2.   Patient to maintain compliance with precautions/protocol to ensure proper healing and progression of function  -Status at IE- reviewed patient's post-op precautions and protocol  3.   Patient to demonstrate dynamic sitting/standing tolerance out of brace in clinic of > 30' to facilitate tolerance and performance of light household chores.   -Status at IE- Restricted in hard cervical collar at all times aside from eating.     Long Term Goals: To be accomplished in 8-12 weeks  Patient to be Safe and Independent with HEP to self-manage/prevent symptoms after DC.  Patient to improve NDI score to < 24% indicate improved functional status and independence.  -  Status at IE- NDI score = 40%  3.  Patient to demonstrate cervical AROM rotation to > 40 degrees bilaterally to allow her to safely scan her environment  -Status at IE- limited to 3 and  5 degrees right and left cervical AROM respectively.     Frequency / Duration: Patient to be seen 2 times per week for 8-12 weeks . Goals will be assigned and reassessed every 10 visits/ 30 days per clinic guidelines.  Patient/ Caregiver education and instruction: Diagnosis, prognosis, activity modification and exercises [x]   Plan of care has been reviewed with PTA.We reviewed our facility's Patient Personal Responsibilities (PPR) form, particularly in regards to compliance towards her appointment time, our attendance policy, and her home exercise program. Patient was informed of possible discharge for non-compliance to our attendance policy per PPR form.We also discussed her POC as deemed appropriate by the treating therapist and physician. Patient verbalized understanding that she must show objective and functional improvement in an appropriate time frame. Patient verbalized understanding that should progress or compliance be lacking, we will contact the referring physician for further consultation to address and attempt to establish alternate treatment strategies as necessary and/or possibly discharge.    Certification Period: n/a    Alyn Jurney "BJ" Vern Claude, DPT, Cert. MDT, Cert. DN, Cert. SMT, Dip. Osteopractic       03/22/2024       11:47 AM  ===================================================================  I certify that the above Therapy Services are being furnished while the patient is under my care. I agree with the treatment plan and certify that this therapy is necessary.    Physician's Signature:_________________________   DATE:_________   TIME:________                           Lesle Reek I*    ** Signature, Date and Time must be completed for valid certification **  Please sign and return to [facility] or you may fax the signed copy to [phone].  Thank you.

## 2024-03-29 ENCOUNTER — Encounter: Payer: TRICARE (CHAMPUS) | Primary: Surgical

## 2024-04-03 ENCOUNTER — Inpatient Hospital Stay: Payer: TRICARE (CHAMPUS) | Primary: Surgical

## 2024-04-05 ENCOUNTER — Encounter: Payer: TRICARE (CHAMPUS) | Primary: Surgical

## 2024-04-10 ENCOUNTER — Inpatient Hospital Stay: Admit: 2024-04-10 | Payer: TRICARE (CHAMPUS) | Primary: Surgical

## 2024-04-10 NOTE — Progress Notes (Signed)
 PHYSICAL / OCCUPATIONAL THERAPY - DAILY TREATMENT NOTE    Patient Name: Colleen [name]    Date: 04/10/2024    DOB: 10/07/1961  Insurance: Payor: TRICARE EAST / Plan: [employer]ARE [employer]AST PRIME / Product Type: *No Product type* /      Patient DOB verified Yes     Visit #   Current / Total 2 24   Time   In / Out 10:36 11:24   Pain   In / Out 0/10  0/10 "fatigued"   Subjective Functional Status/Changes: Pt reports weaning out of her neck brace for short times but has to wear it at work. 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 She has no pain just her neck feels tired and she is cautious.     She is getting sensation back around her scar, it feels so weird. She gets a light tingling sensation which she thinks is her nerves waking up.      TREATMENT AREA =  Spinal stenosis, unspecified spinal region    OBJECTIVE    Therapeutic Procedures:    Tx Min Billable or 1:1 Min (if diff from Tx Min) Procedure, Rationale, Specifics   26  97110 Therapeutic Exercise (timed):  increase ROM, strength, coordination, balance, and proprioception to improve patient's ability to progress to PLOF and address remaining functional goals. (see flow sheet as applicable)     Details if applicable:  see FS     12  97530 Therapeutic Activity (timed):  use of dynamic activities replicating functional movements to increase ROM, strength, coordination, balance, and proprioception in order to improve patient's ability to progress to PLOF and address remaining functional goals.  (see flow sheet as applicable)     Details if applicable:  HEP, UBE retro     10  97112 Neuromuscular Re-Education (timed):  improve balance, coordination, kinesthetic sense, posture, core stability and proprioception to improve patient's ability to develop conscious control of individual muscles and awareness of position of extremities in order to progress to PLOF and address remaining functional goals. (see flow sheet as applicable)     Details if applicable:  postural re-ed            Details  if applicable:            Details if applicable:     48  MC BC Totals Reminder: bill using total billable min of TIMED therapeutic procedures (example: do not include dry needle or estim unattended, both untimed codes, in totals to left)  8-22 min = 1 unit; 23-37 min = 2 units; 38-52 min = 3 units; 53-67 min = 4 units; 68-82 min = 5 units   Total Total     Charge Capture    [x]   Patient Education billed concurrently with other procedures   [x]  Review HEP    [x]  Progressed/Changed HEP, detail:  [id number]   []  Other detail:       Objective Information/Functional Measures/Assessment  - initial discomfort in right shoulder with UBE, eased as she progressed with it  - c/s isometrics with sub max pressure  - several frequent quick seated breaks d/t muscle fatigue  - moderate cuing for neutral c/s with therex especially with wall push-up and cat/cow  - issued initial c/s HEP, reviewed, pt verbalizes understanding  - pot declined modalities    Initiated PT POC today per flow sheet, requiring vc and demo 100% of the time for proper form and technique with TE. Plan to assess for DOMS and  f/u with patient NV, on effects of first f/u session.      Patient will continue to benefit from skilled PT / OT services to modify and progress therapeutic interventions, analyze and address functional mobility deficits, analyze and address ROM deficits, analyze and address strength deficits, analyze and address soft tissue restrictions, analyze and cue for proper movement patterns, analyze and modify for postural abnormalities, analyze and address imbalance/dizziness, and instruct in home and community integration to address functional deficits and attain remaining goals.    Progress toward goals / Updated goals:  []   See Progress Note/Recertification    Short Term Goals: To be accomplished in 4 weeks  Patient to be adherent to HEP to facilitate pain control with ADL's.  -Status at IE- HEP initiated.  Current: unsure if she was given HEP,  she is doing what is in her book (LE HEP); issued initial c/s HEP [id number] (04/10/24)    2.   Patient to maintain compliance with precautions/protocol to ensure proper healing and progression of function  -Status at IE- reviewed patient's post-op precautions and protocol  Current: Progressing: reports compliance, she is cautious; min cuing during session (04/10/24)    3.   Patient to demonstrate dynamic sitting/standing tolerance out of brace in clinic of > 30' to facilitate tolerance and performance of light household chores.   -Status at IE- Restricted in hard cervical collar at all times aside from eating.   Current: MET: no collar during session today (04/10/24)     Long Term Goals: To be accomplished in 8-12 weeks  Patient to be Safe and Independent with HEP to self-manage/prevent symptoms after DC.    Patient to improve NDI score to < 24% indicate improved functional status and independence.  -Status at IE- NDI score = 40%    3.  Patient to demonstrate cervical AROM rotation to > 40 degrees bilaterally to allow her to safely scan her environment  -Status at IE- limited to 3 and 5 degrees right and left cervical AROM respectively.     Next PN 04/21/24  Auth due (visit number/ date) 15v exp 01/23/24 - 05/22/24    PLAN  - Continue Plan of Care    Rex Castor, PTA    04/10/2024    6:56 AM  If an interpreting service was utilized for treatment of this patient, the contents of this document represent the material reviewed with the patient via the interpreter.     Future Appointments   Date Time Provider Department Center   04/10/2024  7:20 AM Maryanne Smiles [facility]   04/10/2024  9:20 AM Raeanne Bull, [place] [facility]   04/12/2024  8:40 AM Maryanne Smiles [facility]   04/12/2024 10:40 AM Raeanne Bull, [place] [facility]   04/17/2024  9:20 AM Rex Castor, PTA [facility]   04/17/2024 10:00 AM Raeanne Bull, [place] [facility]   04/19/2024  8:00 AM Maryanne Smiles [facility]   04/19/2024   8:40 AM Raeanne Bull, [place] [facility]   04/24/2024  9:20 AM Rex Castor, PTA [facility]   04/24/2024 10:00 AM Raeanne Bull, [place] [facility]   04/26/2024  8:00 AM Lear Prosper, PT [facility]   04/26/2024  8:40 AM Raeanne Bull, [place] [facility]   05/01/2024  9:20 AM Raeanne Bull, [place] [facility]   05/01/2024 10:00 AM Rex Castor, PTA [facility]   05/03/2024  8:00 AM Valma Gazella, PTA [facility]  05/03/2024  8:40 AM Raeanne Bull, OT [facility]   05/08/2024  8:40 AM Rex Castor, PTA [facility]   05/08/2024  9:20 AM Raeanne Bull, [place] [facility]   05/10/2024  8:00 AM Lear Prosper, PT [facility]   05/10/2024  8:40 AM Raeanne Bull, [place] [facility]   05/15/2024  8:40 AM Lear Prosper, PT [facility]   05/15/2024  9:20 AM Raeanne Bull, [place] [facility]   05/17/2024  8:00 AM Lear Prosper, PT [facility]   05/17/2024  8:40 AM Christophe Cram [place], [place] [facility]

## 2024-04-10 NOTE — Progress Notes (Signed)
 OCCUPATIONAL THERAPY - DAILY TREATMENT NOTE    Patient Name: Colleen [name]    Date: 04/10/2024    DOB: September 11, 1961  Insurance: Payor: TRICARE EAST / Plan: TRICARE EAST PRIME / Product Type: *No Product type* /        Ins Auth:  15 visits expires 05/22/24             Next PN Due By:                    04/19/24         Patient DOB verified Yes     Visit #   Current / Total 2 12   Time   In / Out 920 1000   Total Treatment Time 40   Pain   In / Out 0 0   Subjective Functional Status/Changes: Took neck brace off and now I'm a bit guarded      TREATMENT AREA =  Spinal stenosis, site unspecified [M48.00]  Generalized muscle weakness [M62.81]    OBJECTIVE      Therapeutic Procedures:  Tx Min Billable or 1:1 Min (if diff from Tx Min) Procedure, Rationale, Specifics   38  97110 Therapeutic Exercise (timed):  increase ROM, strength, coordination, and proprioception to grip (see flow sheet as applicable)    right  Clips and sponges 4# clip 3pt pinch x 50  Digit extensions x10   Towel slides shoulder abduction x10  Towel slides  IR/ER, flex/ext, x15  Clips and sponges 4#, x50    bil  AROM Shoulder circles forward x10, backwards x10 Shoulder Shrugs x 15  Scapular retraction x15                                  TOTAL 1:1 TREATMENT TIME:  (Total Time - Paraffin/Vaso/Fluido)        40         Billed concurrently with other treatments Patient Education:  Reviewed HEP     Objective Information/Functional Measures/Assessment    Pt arrived with a 13# backpack, however pt has a 10# weight restriction     No p! With AROM , towel slides, or shoulder shrugs and retractions     Fatigue during tx but no p!    Pt provide shouder exercise HEP          Assessment/Plan:      Continue AROM to bil shoulder and elbow , stretches and use of 4# clip for pinch exercises               []   See Progress Note/Re certification     If an interpreting service was utilized for treatment of this patient, the contents of this document represent the material reviewed  with the patient via the interpreter.      Patient will continue to benefit from skilled OT services to modify and progress therapeutic interventions, analyze and address ROM deficits, analyze and address strength deficits, analyze and cue for proper movement patterns, and instruct in home and community integration  to attain remaining goals.   Progress toward goals / Updated goals:      Patient will be independent with HEP for bilateral UE ROM/stretches/strengthening to increase ROM and strength for ADL/IADL tasks.   Status at Eval: food putty item HEP with medium soft putty      Patient will be independent in demonstrating and performing activity modification strategies to aid  in success for work, self-care, meal preparation and home management tasks.   Status at Eval: Not initiated        Long Term Goals: To be accomplished in 4-6 weeks  Patient will show a 10% or more improvement in QuickDASH score, demonstrating improve function to complete ADLs/IADLs.  Status at Eval: 39%     Pt will increase right shoulder abduction and IR by 15 deg or greater in order to return to PLOF.   Status at eval: abduction= 87 deg; IR= 10 degp!   Current Status (04/10/2024): pt able to perform towel slides with tension but no p!    Pt will increase strength of Right  UEs by 1/2 MMT grade or more in order to be able to push up from a chair.   Status at Eval: see MMT chart     Patient will increase Right hand grip strength to 55# or greater without increased pain to improve grasp on objects during ADL/IADL tasks.   Status at Eval: 44#p!      Patient will increase  Right hand 3pt, lateral, and tip pinch strength by 3 pounds or greater to improve participation in meal preparation and home management tasks.  Status at Eval: 3pt= 6#; lateral= 13#; tip= 5#  Current Status (04/10/2024): pt able to use 4# clip with 3pt pinch with no p!       PLAN  [x]   Continue Plan of Care  []   Upgrade activities as tolerated    []   Discharge due to :    []    Other:      Therapist: Twanna Galas, OTAS     Date: 04/10/2024 Time: 8:49 AM     Future Appointments   Date Time Provider Department Center   04/10/2024  9:20 AM Raeanne Bull, [place] [facility]   04/10/2024 10:40 AM Rex Castor, PTA [facility]   04/12/2024  8:40 AM Rex Castor, PTA [facility]   04/12/2024 10:40 AM Raeanne Bull, [place] [facility]   04/17/2024  9:20 AM Rex Castor, PTA [facility]   04/17/2024 10:00 AM Raeanne Bull, [place] [facility]   04/19/2024  8:00 AM Rex Castor, PTA [facility]   04/19/2024  8:40 AM Raeanne Bull, [place] [facility]   04/24/2024  9:20 AM Rex Castor, PTA [facility]   04/24/2024 10:00 AM Raeanne Bull, [place] [facility]   04/26/2024  8:00 AM Lear Prosper, PT [facility]   04/26/2024  8:40 AM Raeanne Bull, [place] [facility]   05/01/2024  9:20 AM Raeanne Bull, [place] [facility]   05/01/2024 10:00 AM Rex Castor, PTA [facility]   05/03/2024  8:00 AM Valma Gazella, PTA [facility]   05/03/2024  8:40 AM Raeanne Bull, OT [facility]   05/08/2024  8:40 AM Rex Castor, PTA [facility]   05/08/2024  9:20 AM Raeanne Bull, [place] [facility]   05/10/2024  8:00 AM Lear Prosper, PT [facility]   05/10/2024  8:40 AM Raeanne Bull, [place] [facility]   05/15/2024  8:40 AM Lear Prosper, PT [facility]   05/15/2024  9:20 AM Raeanne Bull, [place] [facility]   05/17/2024  8:00 AM Lear Prosper, PT [facility]   05/17/2024  8:40 AM Raeanne Bull, OT [facility]

## 2024-04-12 ENCOUNTER — Inpatient Hospital Stay: Admit: 2024-04-12 | Payer: TRICARE (CHAMPUS) | Primary: Surgical

## 2024-04-12 NOTE — Progress Notes (Addendum)
 OCCUPATIONAL THERAPY - DAILY TREATMENT NOTE    Patient Name: Colleen [name]    Date: 04/12/2024    DOB: Dec 16, 1961  Insurance: Payor: [employer]ARE EAST / Plan: [employer] PRIME / Product Type: *No Product type* /        Ins Auth:  15 visits expires 05/22/24             Next PN Due By:                    04/19/24         Patient DOB verified Yes     Visit #   Current / Total 3 12   Time   In / Out 1041 1129   Total Treatment Time 50   Pain   In / Out 0 0   Subjective Functional Status/Changes: My backpack is lighter makes me hurt less    It feels stretchy. In reference to towel slides      TREATMENT AREA =  Spinal stenosis, site unspecified [M48.00]  Generalized muscle weakness [M62.81]    OBJECTIVE      Modalities Rationale:   decrease inflammation, decrease pain, increase tissue extensibility, and increase muscle contraction/control to improve the patient's ability to relax shoulder post Tx                                                                    1:1 time/Billable      min []  Estim, type/location:       [x]   att       []   w/ice    []   w/heat    min []   Ultrasound: Duty Cycle:  Frequency:  W/cm2                                                               Non 1:1 time/Non billable   8   min []   Ice     [x]   Heat     Location/Position:  Bil shoulders                                                                 Non 1:1 time/Billable    min []   Paraffin:       Location:      min []   Vasopneumatic Device Pressure/Temp:  Location:  Pre:  Post:      min []   Fluido/[place]ir[place]: Location:  Position: AROM   [x]  Skin assessment post-treatment (if applicable):    []   intact    []   redness- no adverse reaction     [] redness - adverse reaction:          Therapeutic Procedures:  Tx Min Billable or 1:1 Min (if diff from Tx Min) Procedure, Rationale, Specifics   42  [id number] Therapeutic Exercise (timed):  increase ROM, strength, coordination, and proprioception to GRIP (see flow sheet as applicable)    RIGHT  Clips and sponges 4# clip  tip pinch x 50  Digit extensions x10 with 5 sec hold   Towel slides shoulder horz abduction x20, flex/ext, x20 with 5 sec hold        BIL  AROM Shoulder circles forward x10, backwards x10 Shoulder Shrugs x 20 with 5 sec hold   Scapular retraction x20 with 5 sec hold                                   TOTAL 1:1 TREATMENT TIME:  (Total Time - Paraffin/Vaso/Fluido)        42         Billed concurrently with other treatments Patient Education:  Reviewed HEP     Objective Information/Functional Measures/Assessment    No p! With 4# clip in tip inch exercise    Breaks (2) required during shoulder shrugs @10 , and @15     Breaks (2) required during shoulder retractions @ 10, @ 15    Breaks required during towel slides     Pt feeling unwell at end of session, reports she is diabetic but ate this morning, however only protein, provided pt with apple sauce and pt felt better after eating. - education on importance of keeping some snacks with her for her blood sugar        Assessment/Plan:    Increase to 6# clip next session     Continue Shoulder ROM exercises, increase Reps as needed     Continue towel slides for ROM in shoulder              []   See Progress Note/Re certification     If an interpreting service was utilized for treatment of this patient, the contents of this document represent the material reviewed with the patient via the interpreter.      Patient will continue to benefit from skilled OT services to modify and progress therapeutic interventions, analyze and address ROM deficits, analyze and address strength deficits, analyze and cue for proper movement patterns, analyze and modify for postural abnormalities, and instruct in home and community integration  to attain remaining goals.   Progress toward goals / Updated goals:        Patient will be independent with HEP for bilateral UE ROM/stretches/strengthening to increase ROM and strength for ADL/IADL tasks.   Status at Eval: food putty item HEP with medium soft  putty      Patient will be independent in demonstrating and performing activity modification strategies to aid in success for work, self-care, meal preparation and home management tasks.   Status at Eval: Not initiated        Long Term Goals: To be accomplished in 4-6 weeks  Patient will show a 10% or more improvement in QuickDASH score, demonstrating improve function to complete ADLs/IADLs.  Status at Eval: 39%     Pt will increase right shoulder abduction and IR by 15 deg or greater in order to return to PLOF.   Status at eval: abduction= 87 deg; IR= 10 degp!   Current Status (04/10/2024): pt able to perform towel slides with tension but no p!   Current Status (04/12/2024): pt able to perform towel slides with tension but no p!    Pt will increase strength of Right  UEs by 1/2 MMT grade or more  in order to be able to push up from a chair.   Status at Eval: see MMT chart     Patient will increase Right hand grip strength to 55# or greater without increased pain to improve grasp on objects during ADL/IADL tasks.   Status at Eval: 44#p!        Patient will increase  Right hand 3pt, lateral, and tip pinch strength by 3 pounds or greater to improve participation in meal preparation and home management tasks.  Status at Eval: 3pt= 6#; lateral= 13#; tip= 5#  Current Status (04/10/2024): pt able to use 4# clip with 3pt pinch with no p!   Current Status (04/12/2024): pt able to tip inch into 4# clip with no p!        PLAN  [x]   Continue Plan of Care  []   Upgrade activities as tolerated    []   Discharge due to :    []   Other:      Therapist: Twanna Galas, OTAS    I was present during the entire treatment, directing and participating in the treatment.  Raeanne Bull OTR/L      Date: 04/12/2024 Time: 8:27 AM     Future Appointments   Date Time Provider Department Center   04/12/2024  8:40 AM Lear Prosper, PT [facility]   04/12/2024 10:40 AM Raeanne Bull, [place] [facility]   04/17/2024  9:20 AM Rex Castor, PTA [facility]   04/17/2024 10:00 AM Raeanne Bull, [place] [facility]   04/19/2024  8:00 AM Rex Castor, PTA [facility]   04/19/2024  8:40 AM Raeanne Bull, [place] [facility]   04/24/2024  9:20 AM Rex Castor, PTA [facility]   04/24/2024 10:00 AM Raeanne Bull, [place] [facility]   04/26/2024  8:00 AM Lear Prosper, PT [facility]   04/26/2024  8:40 AM Raeanne Bull, [place] [facility]   05/01/2024  9:20 AM Raeanne Bull, [place] [facility]   05/01/2024 10:00 AM Rex Castor, PTA [facility]   05/03/2024  8:00 AM Valma Gazella, PTA [facility]   05/03/2024  8:40 AM Raeanne Bull, OT [facility]   05/08/2024  8:40 AM Rex Castor, PTA [facility]   05/08/2024  9:20 AM Raeanne Bull, [place] [facility]   05/10/2024  8:00 AM Lear Prosper, PT [facility]   05/10/2024  8:40 AM Raeanne Bull, [place] [facility]   05/15/2024  8:40 AM Lear Prosper, PT [facility]   05/15/2024  9:20 AM Raeanne Bull, [place] [facility]   05/17/2024  8:00 AM Lear Prosper, PT [facility]   05/17/2024  8:40 AM Raeanne Bull, OT [facility]

## 2024-04-12 NOTE — Progress Notes (Signed)
 PHYSICAL THERAPY - DAILY TREATMENT NOTE (updated 1/23)    Patient Name: Colleen [name]    Date: 04/12/2024    DOB: May 14, 1961  Insurance: Payor: [employer]ARE EAST / Plan: [employer]ARE [employer]AST PRIME / Product Type: *No Product type* /      Patient DOB verified yes     Visit #   Current / Total 3 24   Time   In / Out 840 930   Pain   In / Out 0 0   Subjective Functional Status/Changes: "I took a lot of things out of my bag like you guys said to do. Check it out. "     TREATMENT AREA =  Spinal stenosis, unspecified spinal region      Next PN 04/21/24  Auth due (visit number/ date) 15v exp 01/23/24 - 05/22/24    OBJECTIVE    Therapeutic Procedures:  Tx Min Procedure, Rationale, Specifics   15 [id number] Therapeutic Exercise (timed):  increase ROM, strength, coordination, balance, and proprioception to improve patient's ability to progress to PLOF and address remaining functional goals. (see flow sheet as applicable)     Details if applicable:       10 97530 Therapeutic Activity (timed):  use of dynamic activities replicating functional movements to increase ROM, strength, coordination, balance, and proprioception in order to improve patient's ability to progress to PLOF and address remaining functional goals.  (see flow sheet as applicable)     Details if applicable:     15 97112 Neuromuscular Re-Education (timed):  improve balance, coordination, kinesthetic sense, posture, core stability and proprioception to improve patient's ability to develop conscious control of individual muscles and awareness of position of extremities in order to progress to PLOF and address remaining functional goals. (see flow sheet as applicable)     Details if applicable:     Total  40     Heat (UNBILLED):  location/position: CS. Supine with wedge .    Min Rationale   10 decrease pain to improve patient's ability to progress to PLOF and address remaining functional goals.     Skin assessment post-treatment:   Intact     Charge Capture    [x]   Patient Education billed  concurrently with other procedures   [x]  Review HEP    []  Progressed/Changed HEP  []  Other:    Objective Information/Functional Measures/Assessment    Added and advanced therex per flow sheet.  Tactile cueing provided with scapular clock   Added head lift in supine.   Educated patient re: proprioception and kinesthetic awareness in relation to C/S and head positioning  Initiated C/S rotation in sitting.  Patient will continue to benefit from skilled PT services to modify and progress therapeutic interventions, analyze and address functional mobility deficits, analyze and address ROM deficits, analyze and address strength deficits, analyze and address soft tissue restrictions, analyze and cue for proper movement patterns, and instruct in home and community integration to address functional deficits and attain remaining goals.    Progress toward goals / Updated goals:  []   See Progress Note/Recertification    Added new TE/TA to promote C/S rotation and stability   Sitting tolerance improving per patient report as she is weaning herself from the cervical hard collar.  s    PLAN  yes Continue plan of care  []   Upgrade activities as tolerated  []   Discharge: See DC Note  []   Other:    Chalice Colt "BJ" Bobi Daudelin, DPT, Cert. MDT, Cert. DN, Cert. SMT, Dip. Osteopractic  04/12/2024    8:54 AM  If an interpreting service was utilized for treatment of this patient, the contents of this document represent the material reviewed with the patient via the interpreter.  Future Appointments   Date Time Provider Department Center   04/12/2024 10:40 AM Raeanne Bull, [place] [facility]   04/17/2024  9:20 AM Maryanne Smiles [facility]   04/17/2024 10:00 AM Raeanne Bull, [place] [facility]   04/19/2024  8:00 AM Maryanne Smiles [facility]   04/19/2024  8:40 AM Raeanne Bull, [place] [facility]   04/24/2024  9:20 AM Rex Castor, PTA [facility]   04/24/2024 10:00 AM Raeanne Bull, [place] [facility]   04/26/2024  8:00 AM Lear Prosper, PT  [facility]   04/26/2024  8:40 AM Raeanne Bull, [place] [facility]   05/01/2024  9:20 AM Raeanne Bull, [place] [facility]   05/01/2024 10:00 AM Rex Castor, PTA [facility]   05/03/2024  8:00 AM Valma Gazella, PTA [facility]   05/03/2024  8:40 AM Raeanne Bull, [place] [facility]   05/08/2024  8:40 AM Maryanne Smiles [facility]   05/08/2024  9:20 AM Raeanne Bull, [place] [facility]   05/10/2024  8:00 AM Lear Prosper, PT [facility]   05/10/2024  8:40 AM Raeanne Bull, [place] [facility]   05/15/2024  8:40 AM Lear Prosper, PT [facility]   05/15/2024  9:20 AM Raeanne Bull, [place] [facility]   05/17/2024  8:00 AM Lear Prosper, PT [facility]   05/17/2024  8:40 AM Raeanne Bull, OT [facility]

## 2024-04-17 ENCOUNTER — Inpatient Hospital Stay: Payer: TRICARE (CHAMPUS) | Primary: Surgical

## 2024-04-17 NOTE — Telephone Encounter (Signed)
 Attempted to call pt re: NS, received "person you are calling cannot reeive calls at thsi time" message.

## 2024-04-19 ENCOUNTER — Inpatient Hospital Stay: Payer: TRICARE (CHAMPUS) | Primary: Surgical

## 2024-04-19 DIAGNOSIS — M4800 Spinal stenosis, site unspecified: Secondary | ICD-10-CM

## 2024-04-19 NOTE — Telephone Encounter (Signed)
 Attempted to call pt re: NS: phone message states this person is unable to receive calls.

## 2024-04-19 NOTE — Progress Notes (Incomplete)
 OCCUPATIONAL THERAPY - DAILY TREATMENT NOTE    Patient Name: Colleen [name]    Date: 04/19/2024    DOB: Jul 14, 1961  Insurance: Payor: [employer]ARE EAST / Plan: [employer]ARE [employer] PRIME / Product Type: *No Product type* /        Ins Auth:  ***          Next PN Due By:                    ***         Patient DOB verified Yes     Visit #   Current / Total *** ***   Time   In / Out *** ***   Total Treatment Time ***   Pain   In / Out *** ***   Subjective Functional Status/Changes: ***     TREATMENT AREA =  Spinal stenosis, site unspecified [M48.00]  Generalized muscle weakness [M62.81]    OBJECTIVE      Modalities Rationale:   {BSHSI INMOTION MODALITIES:41261} to improve the patient's ability to ***                                                                    1:1 time/Billable      min []  Estim, type/location:       [x]   att       []   w/ice    []   w/heat    min []   Ultrasound: Duty Cycle:  Frequency:  W/cm2                                                               Non 1:1 time/Non billable      min []   Ice     []   Heat     Location/Position:                                                                Non 1:1 time/Billable    min []   Paraffin:       Location:      min []   Vasopneumatic Device Pressure/Temp:  Location:  Pre:  Post:      min []   Fluido/[place]ir[place]: Location:  Position: AROM   [x]  Skin assessment post-treatment (if applicable):    []   intact    []   redness- no adverse reaction     [] redness - adverse reaction:          Therapeutic Procedures:  Tx Min Billable or 1:1 Min (if diff from Tx Min) Procedure, Rationale, Specifics     97110 Therapeutic Exercise (timed):  increase ROM, strength, coordination, and proprioception to grip  (see flow sheet as applicable)    Recheck for PN         {InMotion Ther Procedures (Optional):56147}    ***       {  InMotion Ther Procedures (Optional):56147}     {InMotion Ther Procedures (Optional):56147}       {InMotion Ther Procedures (Optional):56147}       {InMotion Ther Procedures  (Optional):56147}     TOTAL 1:1 TREATMENT TIME:  (Total Time - Paraffin/Vaso/Fluido)                 Billed concurrently with other treatments Patient Education:  Reviewed HEP     Objective Information/Functional Measures/Assessment    HEP: ***  Activity modification:***  Quickdash: ***%    RIGHT  Shoulder Ab:***degrees  Shoulder IR: *** degrees  MMT: ***  Grip: ***#  3pt pinch:***#  Lateral pinch:***#  Tip pinch: ***#         Assessment/Plan:    Patient has been present for *** visits with one missed visit. They *** compliant with HEP and activity modification. Their Quickdash score is ***% (*** of ***%). Their right shoulder abduction is *** degrees (*** of *** deg.). they have *** degrees of right shoulder IR (*** of *** deg.). Their right grip strength is ***# (*** of ***#). Their pinch strengths are- 3pt = ***# (*** of ***#), lateral = ***# (*** of ***#), and Tip = ***#(*** of ***#).              []   See Progress Note/Re certification     If an interpreting service was utilized for treatment of this patient, the contents of this document represent the material reviewed with the patient via the interpreter.      Patient will continue to benefit from skilled OT services to modify and progress therapeutic interventions, analyze and address ROM deficits, analyze and address strength deficits, analyze and cue for proper movement patterns, analyze and modify for postural abnormalities, and instruct in home and community integration  to attain remaining goals.   Progress toward goals / Updated goals:    Patient will be independent with HEP for bilateral UE ROM/stretches/strengthening to increase ROM and strength for ADL/IADL tasks.   Status at Eval: food putty item HEP with medium soft putty    Status at PN (04/19/24): ***    Patient will be independent in demonstrating and performing activity modification strategies to aid in success for work, self-care, meal preparation and home management tasks.   Status at Eval: Not  initiated    Status at PN (04/19/24): ***     Long Term Goals: To be accomplished in 4-6 weeks  Patient will show a 10% or more improvement in QuickDASH score, demonstrating improve function to complete ADLs/IADLs.  Status at Eval: 39%    Status at PN (04/19/24): ***    Pt will increase right shoulder abduction and IR by 15 deg or greater in order to return to PLOF.   Status at eval: abduction= 87 deg; IR= 10 degp!   Current Status (04/10/2024): pt able to perform towel slides with tension but no p!   Current Status (04/12/2024): pt able to perform towel slides with tension but no p!    Status at PN (04/19/24): ***    Pt will increase strength of Right  UEs by 1/2 MMT grade or more in order to be able to push up from a chair.   Status at Eval: see MMT chart    Status at PN (04/19/24): ***    Patient will increase Right hand grip strength to 55# or greater without increased pain to improve grasp on objects during ADL/IADL tasks.   Status at Eval: 44#p!  Status at PN (04/19/24): ***       Patient will increase  Right hand 3pt, lateral, and tip pinch strength by 3 pounds or greater to improve participation in meal preparation and home management tasks.  Status at Eval: 3pt= 6#; lateral= 13#; tip= 5#  Current Status (04/10/2024): pt able to use 4# clip with 3pt pinch with no p!   Current Status (04/12/2024): pt able to tip inch into 4# clip with no p!    Status at PN (04/19/24): ***       PLAN  [x]   Continue Plan of Care  []   Upgrade activities as tolerated    []   Discharge due to :    []   Other:      Therapist: Twanna Galas, OTAS    Date: 04/19/2024 Time: 7:31 AM     Future Appointments   Date Time Provider Department Center   04/19/2024  8:00 AM Rex Castor, PTA [facility]   04/19/2024  8:40 AM Raeanne Bull, [place] [facility]   04/24/2024  9:20 AM Rex Castor, PTA [facility]   04/24/2024 10:00 AM Raeanne Bull, [place] [facility]   04/26/2024  8:00 AM Lear Prosper, PT [facility]   04/26/2024  8:40 AM Raeanne Bull, [place]  [facility]   05/01/2024  9:20 AM Raeanne Bull, [place] [facility]   05/01/2024 10:00 AM Rex Castor, PTA [facility]   05/03/2024  8:00 AM Valma Gazella, PTA [facility]   05/03/2024  8:40 AM Raeanne Bull, OT [facility]   05/08/2024  8:40 AM Rex Castor, PTA [facility]   05/08/2024  9:20 AM Raeanne Bull, [place] [facility]   05/10/2024  8:00 AM Lear Prosper, PT [facility]   05/10/2024  8:40 AM Raeanne Bull, [place] [facility]   05/15/2024  8:40 AM Lear Prosper, PT [facility]   05/15/2024  9:20 AM Raeanne Bull, [place] [facility]   05/17/2024  8:00 AM Lear Prosper, PT [facility]   05/17/2024  8:40 AM Raeanne Bull, OT [facility]   .

## 2024-04-24 ENCOUNTER — Inpatient Hospital Stay: Payer: TRICARE (CHAMPUS) | Primary: Surgical

## 2024-04-24 NOTE — Progress Notes (Incomplete)
 OCCUPATIONAL THERAPY - DAILY TREATMENT NOTE    Patient Name: Colleen [name]    Date: 04/24/2024    DOB: 01-Mar-1961  Insurance: Payor: [employer] / Plan: [employer] PRIME / Product Type: *No Product type* /        Ins Auth:  15 visits expires 05/22/24           Next PN Due By:                    04/24/24         Patient DOB verified Yes     Visit #   Current / Total 4 12   Time   In / Out 1040 1120   Total Treatment Time ***   Pain   In / Out *** ***   Subjective Functional Status/Changes: ***     TREATMENT AREA =  Spinal stenosis, site unspecified [M48.00]  Generalized muscle weakness [M62.81]    OBJECTIVE      Modalities Rationale:   {BSHSI INMOTION MODALITIES:41261} to improve the patient's ability to ***                                                                    1:1 time/Billable      min []  Estim, type/location:       [x]   att       []   w/ice    []   w/heat    min []   Ultrasound: Duty Cycle:  Frequency:  W/cm2                                                               Non 1:1 time/Non billable      min []   Ice     []   Heat     Location/Position:                                                                Non 1:1 time/Billable    min []   Paraffin:       Location:      min []   Vasopneumatic Device Pressure/Temp:  Location:  Pre:  Post:      min []   Fluido/[place]irpool: Location:  Position: AROM   [x]  Skin assessment post-treatment (if applicable):    []   intact    []   redness- no adverse reaction     [] redness - adverse reaction:          Therapeutic Procedures:  Tx Min Billable or 1:1 Min (if diff from Tx Min) Procedure, Rationale, Specifics     97110 Therapeutic Exercise (timed):  increase ROM, strength, coordination, and proprioception to grip  (see flow sheet as applicable)    Recheck for PN         {InMotion Ther Procedures (Optional):56147}    ***       {  InMotion Ther Procedures (Optional):56147}     {InMotion Ther Procedures (Optional):56147}       {InMotion Ther Procedures (Optional):56147}        {InMotion Ther Procedures (Optional):56147}     TOTAL 1:1 TREATMENT TIME:  (Total Time - Paraffin/Vaso/Fluido)                 Billed concurrently with other treatments Patient Education:  Reviewed HEP     Objective Information/Functional Measures/Assessment    HEP: ***  Activity modification:***  Quickdash: ***%    RIGHT  Shoulder Ab:***degrees  Shoulder IR: *** degrees  MMT: ***  Grip: ***#  3pt pinch:***#  Lateral pinch:***#  Tip pinch: ***#         Assessment/Plan:    Patient has been present for *** visits with one missed visit. They *** compliant with HEP and activity modification. Their Quickdash score is ***% (*** of ***%). Their right shoulder abduction is *** degrees (*** of *** deg.). they have *** degrees of right shoulder IR (*** of *** deg.). Their right grip strength is ***# (*** of ***#). Their pinch strengths are- 3pt = ***# (*** of ***#), lateral = ***# (*** of ***#), and Tip = ***#(*** of ***#).              []   See Progress Note/Re certification     If an interpreting service was utilized for treatment of this patient, the contents of this document represent the material reviewed with the patient via the interpreter.      Patient will continue to benefit from skilled OT services to modify and progress therapeutic interventions, analyze and address ROM deficits, analyze and address strength deficits, analyze and cue for proper movement patterns, analyze and modify for postural abnormalities, and instruct in home and community integration  to attain remaining goals.   Progress toward goals / Updated goals:    Patient will be independent with HEP for bilateral UE ROM/stretches/strengthening to increase ROM and strength for ADL/IADL tasks.   Status at Eval: food putty item HEP with medium soft putty    Status at PN (04/19/24): ***    Patient will be independent in demonstrating and performing activity modification strategies to aid in success for work, self-care, meal preparation and home management  tasks.   Status at Eval: Not initiated    Status at PN (04/19/24): ***     Long Term Goals: To be accomplished in 4-6 weeks  Patient will show a 10% or more improvement in QuickDASH score, demonstrating improve function to complete ADLs/IADLs.  Status at Eval: 39%    Status at PN (04/19/24): ***    Pt will increase right shoulder abduction and IR by 15 deg or greater in order to return to PLOF.   Status at eval: abduction= 87 deg; IR= 10 degp!   Current Status (04/10/2024): pt able to perform towel slides with tension but no p!   Current Status (04/12/2024): pt able to perform towel slides with tension but no p!    Status at PN (04/19/24): ***    Pt will increase strength of Right  UEs by 1/2 MMT grade or more in order to be able to push up from a chair.   Status at Eval: see MMT chart    Status at PN (04/19/24): ***    Patient will increase Right hand grip strength to 55# or greater without increased pain to improve grasp on objects during ADL/IADL tasks.   Status at Eval: 44#p!  Status at PN (04/19/24): ***       Patient will increase  Right hand 3pt, lateral, and tip pinch strength by 3 pounds or greater to improve participation in meal preparation and home management tasks.  Status at Eval: 3pt= 6#; lateral= 13#; tip= 5#  Current Status (04/10/2024): pt able to use 4# clip with 3pt pinch with no p!   Current Status (04/12/2024): pt able to tip inch into 4# clip with no p!    Status at PN (04/19/24): ***       PLAN  [x]   Continue Plan of Care  []   Upgrade activities as tolerated    []   Discharge due to :    []   Other:      Therapist: Twanna Galas, OTAS    Date: 04/24/2024 Time: 8:37 AM     Future Appointments   Date Time Provider Department Center   04/24/2024  9:20 AM Rex Castor, PTA [facility]   04/24/2024 10:00 AM Raeanne Bull, [place] [facility]   04/26/2024  8:00 AM Lear Prosper, PT [facility]   04/26/2024  8:40 AM Raeanne Bull, OT [facility]   05/01/2024  9:20 AM Raeanne Bull, [place] [facility]   05/01/2024 10:00  AM Rex Castor, PTA [facility]   05/03/2024  8:00 AM Valma Gazella, PTA [facility]   05/03/2024  8:40 AM Raeanne Bull, OT [facility]   05/08/2024  8:40 AM Rex Castor, PTA [facility]   05/08/2024  9:20 AM Raeanne Bull, [place] [facility]   05/10/2024  8:00 AM Lear Prosper, PT [facility]   05/10/2024  8:40 AM Raeanne Bull, [place] [facility]   05/15/2024  8:40 AM Lear Prosper, PT [facility]   05/15/2024  9:20 AM Raeanne Bull, [place] [facility]   05/17/2024  8:00 AM Lear Prosper, PT [facility]   05/17/2024  8:40 AM Raeanne Bull, OT [facility]   .

## 2024-04-24 NOTE — Progress Notes (Incomplete)
 OCCUPATIONAL THERAPY - DAILY TREATMENT NOTE    Patient Name: Colleen [name]    Date: 04/24/2024    DOB: [birth year]  Insurance: Payor: [employer] / Plan: [employer] PRIME / Product Type: *No Product type* /        Ins Auth:  15 visits expires 05/22/24             Next PN Due By:                    04/19/24         Patient DOB verified Yes     Visit #   Current / Total 3 12   Time   In / Out 1041 1129   Total Treatment Time 50   Pain   In / Out 0 0   Subjective Functional Status/Changes: My backpack is lighter makes me hurt less    It feels stretchy. In reference to towel slides      TREATMENT AREA =  Spinal stenosis, site unspecified [M48.00]  Generalized muscle weakness [M62.81]    OBJECTIVE      Modalities Rationale:   decrease inflammation, decrease pain, increase tissue extensibility, and increase muscle contraction/control to improve the patient's ability to relax shoulder post Tx                                                                    1:1 time/Billable      min []  Estim, type/location:       [x]   att       []   w/ice    []   w/heat    min []   Ultrasound: Duty Cycle:  Frequency:  W/cm2                                                               Non 1:1 time/Non billable   8   min []   Ice     [x]   Heat     Location/Position:  Bil shoulders                                                                 Non 1:1 time/Billable    min []   Paraffin:       Location:      min []   Vasopneumatic Device Pressure/Temp:  Location:  Pre:  Post:      min []   Fluido/Whirpool: Location:  Position: AROM   [x]  Skin assessment post-treatment (if applicable):    []   intact    []   redness- no adverse reaction     [] redness - adverse reaction:          Therapeutic Procedures:  Tx Min Billable or 1:1 Min (if diff from Tx Min) Procedure, Rationale, Specifics   42  97110 Therapeutic Exercise (timed):  increase ROM, strength, coordination, and proprioception to GRIP (see flow sheet as applicable)    RIGHT  Clips and sponges 4# clip  tip pinch x 50  Digit extensions x10 with 5 sec hold   Towel slides shoulder horz abduction x20, flex/ext, x20 with 5 sec hold        BIL  AROM Shoulder circles forward x10, backwards x10 Shoulder Shrugs x 20 with 5 sec hold   Scapular retraction x20 with 5 sec hold                                   TOTAL 1:1 TREATMENT TIME:  (Total Time - Paraffin/Vaso/Fluido)        42         Billed concurrently with other treatments Patient Education:  Reviewed HEP     Objective Information/Functional Measures/Assessment    No p! With 4# clip in tip inch exercise    Breaks (2) required during shoulder shrugs @10 , and @15     Breaks (2) required during shoulder retractions @ 10, @ 15    Breaks required during towel slides     Pt feeling unwell at end of session, reports she is diabetic but ate this morning, however only protein, provided pt with apple sauce and pt felt better after eating. - education on importance of keeping some snacks with her for her blood sugar        Assessment/Plan:    Increase to 6# clip next session     Continue Shoulder ROM exercises, increase Reps as needed     Continue towel slides for ROM in shoulder              []   See Progress Note/Re certification     If an interpreting service was utilized for treatment of this patient, the contents of this document represent the material reviewed with the patient via the interpreter.      Patient will continue to benefit from skilled OT services to modify and progress therapeutic interventions, analyze and address ROM deficits, analyze and address strength deficits, analyze and cue for proper movement patterns, analyze and modify for postural abnormalities, and instruct in home and community integration  to attain remaining goals.   Progress toward goals / Updated goals:    Patient will be independent with HEP for bilateral UE ROM/stretches/strengthening to increase ROM and strength for ADL/IADL tasks.   Status at Eval: food putty item HEP with medium soft putty    Status at PN (04/24/24):    Patient will be independent in demonstrating and performing activity modification strategies to aid in success for work, self-care, meal preparation and home management tasks.   Status at Eval: Not initiated  Status at PN (04/24/24):       Long Term Goals: To be accomplished in 4-6 weeks  Patient will show a 10% or more improvement in QuickDASH score, demonstrating improve function to complete ADLs/IADLs.  Status at Eval: 39%  Status at PN (04/24/24):     Pt will increase right shoulder abduction and IR by 15 deg or greater in order to return to PLOF.   Status at eval: abduction= 87 deg; IR= 10 degp!   Current Status (04/10/2024): pt able to perform towel slides with tension but no p!   Current Status (04/12/2024): pt able to perform towel slides with tension but no p!     Pt will increase strength  of Right  UEs by 1/2 MMT grade or more in order to be able to push up from a chair.   Status at Eval: see MMT chart  Status at PN (04/24/24):     Patient will increase Right hand grip strength to 55# or greater without increased pain to improve grasp on objects during ADL/IADL tasks.   Status at Eval: 44#p!  Status at PN (04/24/24):      Patient will increase  Right hand 3pt, lateral, and tip pinch strength by 3 pounds or greater to improve participation in meal preparation and home management tasks.  Status at Eval: 3pt= 6#; lateral= 13#; tip= 5#  Current Status (04/10/2024): pt able to use 4# clip with 3pt pinch with no p!   Current Status (04/12/2024): pt able to tip inch into 4# clip with no p!   Status at PN (04/24/24):       PLAN  [x]   Continue Plan of Care  []   Upgrade activities as tolerated    []   Discharge due to :    []   Other:      Therapist: Raeanne Bull, OT    Date: 04/24/2024 Time: 9:10 AM     Future Appointments   Date Time Provider Department Center   04/24/2024  9:20 AM Rex Castor, PTA [facility]   04/24/2024 10:00 AM Raeanne Bull, [place] [facility]   04/26/2024  8:00 AM Lear Prosper,  PT [facility]   04/26/2024  8:40 AM Raeanne Bull, OT [facility]   05/01/2024  9:20 AM Raeanne Bull, [place] [facility]   05/01/2024 10:00 AM Rex Castor, PTA [facility]   05/03/2024  8:00 AM Valma Gazella, PTA [facility]   05/03/2024  8:40 AM Raeanne Bull, OT [facility]   05/08/2024  8:40 AM Rex Castor, PTA [facility]   05/08/2024  9:20 AM Raeanne Bull, [place] [facility]   05/10/2024  8:00 AM Lear Prosper, PT [facility]   05/10/2024  8:40 AM Raeanne Bull, [place] [facility]   05/15/2024  8:40 AM Lear Prosper, PT [facility]   05/15/2024  9:20 AM Raeanne Bull, [place] [facility]   05/17/2024  8:00 AM Lear Prosper, PT [facility]   05/17/2024  8:40 AM Raeanne Bull, OT [facility]

## 2024-04-24 NOTE — Telephone Encounter (Signed)
 Spoke with pt re: NS x 3, she states [facility] put her on hold because she could not afford copays, she is trying to save up and may need to drop down to 1x week when she does return. transferred to FO to discuss applying for finacial aide as well.

## 2024-04-26 ENCOUNTER — Inpatient Hospital Stay: Admit: 2024-04-26 | Payer: TRICARE (CHAMPUS) | Primary: Surgical

## 2024-04-26 ENCOUNTER — Inpatient Hospital Stay: Payer: TRICARE (CHAMPUS) | Primary: Surgical

## 2024-04-26 NOTE — Progress Notes (Addendum)
 OCCUPATIONAL THERAPY - DAILY TREATMENT NOTE    Patient Name: Colleen [name]    Date: 04/26/2024    DOB: 1961-09-11  Insurance: Payor: [employer] / Plan: [employer] PRIME / Product Type: *No Product type* /        Ins Auth:  15 visits expires 05/22/24           Next PN Due By:                    04/26/24         Patient DOB verified Yes     Visit #   Current / Total 4 12   Time   In / Out 840 920   Total Treatment Time 40   Pain   In / Out 5, in neck at surgery site  3   Subjective Functional Status/Changes: My numbness is gine but now I can feel the p!      TREATMENT AREA =  Spinal stenosis, site unspecified [M48.00]  Generalized muscle weakness [M62.81]    OBJECTIVE      Therapeutic Procedures:  Tx Min Billable or 1:1 Min (if diff from Tx Min) Procedure, Rationale, Specifics   30  97110 Therapeutic Exercise (timed):  increase ROM, strength, coordination, and proprioception to grip  (see flow sheet as applicable)    Recheck for PN       10  97535 Self Care/Home Management (timed):  improve patient knowledge and understanding of home injury/symptom/pain management, positioning, and activity modification  to increase ability to complete ADLs/IADLs independently  (see flow sheet as applicable)    Recheck for PN  QuickDASH                         TOTAL 1:1 TREATMENT TIME:  (Total Time - Paraffin/Vaso/Fluido)        40         Billed concurrently with other treatments Patient Education:  Reviewed HEP     Objective Information/Functional Measures/Assessment    HEP: compliant  Activity modification:compliant   Quickdash: 61%    RIGHT  Shoulder Ab: 135 degrees  Shoulder IR: 85 degrees    MMT:   Shoulder flex: 4-/4+  Shoulder ext: 4+  Shoulder ab: 3  Shoulder add: 3  FA Sup: 5  FA PRON: 5  Wrist flex: 4+/5  Wrist ext 4+/5  RD: 4+/5  UD: 4+/5  Elbow flex: 4+/5  Elbow ext 4+/5    Grip: 49#  3pt pinch:6#  Lateral pinch:9#  Tip pinch: 3#         Assessment/Plan:    Patient has been present for 4 visits with 2 missed visits d/t  economic reasons. They are compliant with HEP and activity modification. Their Quickdash score is 61%% (regression  of 22%). Their right shoulder abduction is 135 degrees (increase of 48 deg.). They have 85 degrees of right shoulder IR (increase  of 75 deg.). Their MMT scores all maintained or improved (detailed in chart). Their right grip strength is 49# (increase of 5#). Their pinch strengths are- 3pt = 6# (maintained), lateral = 9# (decrease of 4#), and Tip = 3#(decrease of 2#).              []   See Progress Note/Re certification     If an interpreting service was utilized for treatment of this patient, the contents of this document represent the material reviewed with the patient via the interpreter.  Patient will continue to benefit from skilled OT services to modify and progress therapeutic interventions, analyze and address ROM deficits, analyze and address strength deficits, analyze and cue for proper movement patterns, analyze and modify for postural abnormalities, and instruct in home and community integration  to attain remaining goals.   Progress toward goals / Updated goals:    Patient will be independent with HEP for bilateral UE ROM/stretches/strengthening to increase ROM and strength for ADL/IADL tasks.   Status at Eval: food putty item HEP with medium soft putty    Status at PN (04/19/24): compliant, goal met    Patient will be independent in demonstrating and performing activity modification strategies to aid in success for work, self-care, meal preparation and home management tasks.   Status at Eval: Not initiated    Status at PN (04/19/24): compliant, goal met      Long Term Goals: To be accomplished in 4-6 weeks  Patient will show a 10% or more improvement in QuickDASH score, demonstrating improve function to complete ADLs/IADLs.  Status at Eval: 39%    Status at PN (04/19/24): 61%    Pt will increase right shoulder abduction and IR by 15 deg or greater in order to return to PLOF.   Status at  eval: abduction= 87 deg; IR= 10 degp!   Current Status (04/10/2024): pt able to perform towel slides with tension but no p!   Current Status (04/12/2024): pt able to perform towel slides with tension but no p!    Status at PN (04/19/24): abduction 135deg. Goal met, IR 85 deg. Goal met     Pt will increase strength of Right  UEs by 1/2 MMT grade or more in order to be able to push up from a chair.   Status at Eval: see MMT chart    Status at PN (04/19/24): see chart         Patient will increase Right hand grip strength to 55# or greater without increased pain to improve grasp on objects during ADL/IADL tasks.   Status at Eval: 44#p!    Status at PN (04/19/24): 49# , goal not met        Patient will increase  Right hand 3pt, lateral, and tip pinch strength by 3 pounds or greater to improve participation in meal preparation and home management tasks.  Status at Eval: 3pt= 6#; lateral= 13#; tip= 5#  Current Status (04/10/2024): pt able to use 4# clip with 3pt pinch with no p!   Current Status (04/12/2024): pt able to tip inch into 4# clip with no p!    Status at PN (04/19/24): 3pt pinch:6#  Lateral pinch:9#  Tip pinch: 3#, goal not met        PLAN  [x]   Continue Plan of Care  []   Upgrade activities as tolerated    []   Discharge due to :    []   Other:      Therapist: Twanna Galas, OTAS    Date: 04/26/2024 Time: 7:30 AM     Future Appointments   Date Time Provider Department Center   04/26/2024  8:00 AM Cinthia Creek, PT [facility]   04/26/2024  8:40 AM Raeanne Bull, OT [facility]   05/01/2024  9:20 AM Raeanne Bull, OT [facility]   05/01/2024 10:00 AM Rex Castor, PTA [facility]   05/03/2024  8:00 AM Valma Gazella, PTA [facility]   05/03/2024  8:40 AM Raeanne Bull, OT [facility]   05/08/2024  8:40 AM Maryanne Smiles [facility]   05/08/2024  9:20 AM Raeanne Bull, [place] [facility]   05/10/2024  8:00 AM Lear Prosper, PT [facility]   05/10/2024  8:40 AM Raeanne Bull, [place] [facility]   05/15/2024   8:40 AM Lear Prosper, PT [facility]   05/15/2024  9:20 AM Raeanne Bull, [place] [facility]   05/17/2024  8:00 AM Lear Prosper, PT [facility]   05/17/2024  8:40 AM Christophe Cram [place], [place] [facility]   .

## 2024-04-26 NOTE — Progress Notes (Signed)
 [facility] AT [facility]  [address] [place], [place] [address] [phone] Fx: [phone]  PROGRESS NOTE  Patient Name: Colleen [name] DOB: [birth year]   Treatment/Medical Diagnosis: Spinal stenosis, site unspecified [M48.00]  Neck pain [M54.2]   Referral Source: Bernita Bristle, PA     Date of Initial Visit: 03/21/23 Attended Visits: 4 Missed Visits: 3     SUMMARY OF TREATMENT  Patient's POC has consisted of therex, therapeutic activities, manual therapy prn, modalities prn, pt. education, and a comprehensive HEP. Treatment strategies used to address functional mobility deficits, ROM deficits, strength deficits, analyze and address soft tissue restrictions, analyze and cue movement patterns, analyze and modify body mechanics/ergonomics, assess and modify postural abnormalities and instruct in home and community integration.     CURRENT STATUS  Patient attended only 3 follow up sessions since her SOC due to financial difficulties. She reports regression in her recovery re: pain, but has improved in her ROM and strength as she has been compliant towards her HEP. She currently demonstrates notable improvement in C/S rotation as follows:   AROM Left Rotation = 52 degrees, AROM Right Rotation = 60 degrees  Patient is now out of cervical collar, with primary complaint being stiffness and fatigue.      GOALS/MEASURE OF PROGRESS Goal Met?     Short Term Goals  Patient to be adherent to HEP to facilitate pain control with ADL's.  -Status at IE- HEP initiated.  2.   Patient to maintain compliance with precautions/protocol to ensure proper healing and progression of function  -Status at IE- reviewed patient's post-op precautions and protocol  3.   Patient to demonstrate dynamic sitting/standing tolerance out of brace in clinic of > 30' to facilitate tolerance and performance of light household chores.   -Status at IE- Restricted in hard  cervical collar at all times aside from eating.   Long Term Goals: To be accomplished in 8-12 weeks  Patient to be Safe and Independent with HEP to self-manage/prevent symptoms after DC.  Patient to improve NDI score to < 24% indicate improved functional status and independence.  -Status at IE- NDI score = 40%  3.  Patient to demonstrate cervical AROM rotation to > 40 degrees bilaterally to allow her to safely scan her environment  -Status at IE- limited to 3 and 5 degrees right and left cervical AROM respectively.    MET      MET          MET                  ONGOING        NO      MET     Non-Medicare, can change goals, can adjust or add frequency duration, no signature required      New Goals to be achieved in __4__ weeks  1. Patient to be Safe and Independent with HEP to self-manage symptoms and progress gains after DC.  2. Patient to improve NDI score to < 24% indicate improved functional status and independence.  3. Patient to report > 70% tolerance towards driving.    Frequency / Duration:   Patient to be seen   2   times per week for   4    weeks:    RECOMMENDATIONS  We would like to continue therapy for progress to goals stated above.      If you have any questions/comments please contact us  directly  at [phone].  Thank you for allowing us  to assist in the care of your patient.    Waldine Zenz "BJ" Nason Conradt, DPT, Cert. MDT, Cert. DN, Cert. SMT, Dip. Osteopractic       04/26/2024       10:07 AM

## 2024-04-26 NOTE — Progress Notes (Incomplete)
 PHYSICAL / OCCUPATIONAL THERAPY - DAILY TREATMENT NOTE    Patient Name: Colleen [name]    Date: 04/26/2024    DOB: 06-05-1961  Insurance: Payor: TRICARE EAST / Plan: TRICARE EAST PRIME / Product Type: *No Product type* /      Patient DOB verified {YES/NO:20444}     Visit #   Current / Total 4 24   Time   In / Out *** ***   Pain   In / Out *** ***   Subjective Functional Status/Changes: ***     TREATMENT AREA =  Spinal stenosis, unspecified spinal region    OBJECTIVE    {[employer] Modality AVWU:98119}     Therapeutic Procedures:    Tx Min Billable or 1:1 Min (if diff from Tx Min) Procedure, Rationale, Specifics     {In[employer] Ther Procedures:54979}     Details if applicable:         {[employer] Ther Procedures:54979}     Details if applicable:       {In[employer] Ther Procedures:54979}     Details if applicable:       {In[employer] Ther Procedures:54979}     Details if applicable:       {In[employer] Ther Procedures:54979}     Details if applicable:       [facility] BC Totals Reminder: bill using total billable min of TIMED therapeutic procedures (example: do not include dry needle or estim unattended, both untimed codes, in totals to left)  8-22 min = 1 unit; 23-37 min = 2 units; 38-52 min = 3 units; 53-67 min = 4 units; 68-82 min = 5 units   Total Total     Charge Capture    [x]   Patient Education billed concurrently with other procedures   [x]  Review HEP    []  Progressed/Changed HEP, detail:    []  Other detail:       Objective Information/Functional Measures/Assessment    ***  HEP? ***  NDI:  Cervical rotation AROM right: left: ***    Patient will continue to benefit from skilled PT / OT services to {[employer] Skilled Services:54981} to address functional deficits and attain remaining goals.    Progress toward goals / Updated goals:  [x]   See Progress Note/Recertification      PLAN  {[employer] JYNW:29562}    Cinthia Creek, PT    04/26/2024    6:44 AM  If an interpreting service was utilized for treatment of this patient, the contents of  this document represent the material reviewed with the patient via the interpreter.     Future Appointments   Date Time Provider Department Center   04/26/2024  8:00 AM Cinthia Creek, PT [facility]   04/26/2024  8:40 AM Raeanne Bull, [place] [facility]   05/01/2024  9:20 AM Raeanne Bull, [place] [facility]   05/01/2024 10:00 AM Rex Castor, PTA [facility]   05/03/2024  8:00 AM Valma Gazella, PTA [facility]   05/03/2024  8:40 AM Raeanne Bull, [place] [facility]   05/08/2024  8:40 AM Maryanne Smiles [facility]   05/08/2024  9:20 AM Raeanne Bull, [place] [facility]   05/10/2024  8:00 AM Lear Prosper, PT [facility]   05/10/2024  8:40 AM Raeanne Bull, [place] [facility]   05/15/2024  8:40 AM Lear Prosper, PT [facility]   05/15/2024  9:20 AM Raeanne Bull, [place] [facility]   05/17/2024  8:00 AM Lear Prosper, PT [facility]   05/17/2024  8:40 AM  [place], [place], [place] [facility]

## 2024-04-26 NOTE — Progress Notes (Signed)
 PHYSICAL THERAPY - DAILY TREATMENT NOTE (updated 1/23)    Patient Name: Colleen [name]    Date: 04/26/2024    DOB: 10/12/1961  Insurance: Payor: TRICARE EAST / Plan: TRICARE EAST PRIME / Product Type: *No Product type* /      Patient DOB verified yes     Visit #   Current / Total 4 12   Time   In / Out 9:57 1057   Pain   In / Out 5 0   Subjective Functional Status/Changes: "I'm no longer numb, but I'm achey. "     TREATMENT AREA =  Spinal stenosis, unspecified spinal region    Next PN due TODAY    OBJECTIVE    Therapeutic Procedures:  Tx Min Procedure, Rationale, Specifics   24 [id number] Therapeutic Exercise (timed):  increase ROM, strength, coordination, balance, and proprioception to improve patient's ability to progress to PLOF and address remaining functional goals. (see flow sheet as applicable)     Details if applicable:       10 97530 Therapeutic Activity (timed):  use of dynamic activities replicating functional movements to increase ROM, strength, coordination, balance, and proprioception in order to improve patient's ability to progress to PLOF and address remaining functional goals.  (see flow sheet as applicable)     Details if applicable:     8 97112 Neuromuscular Re-Education (timed):  improve balance, coordination, kinesthetic sense, posture, core stability and proprioception to improve patient's ability to develop conscious control of individual muscles and awareness of position of extremities in order to progress to PLOF and address remaining functional goals. (see flow sheet as applicable)     Details if applicable:     8 97535 Self Care/Home Management (timed):  improve patient knowledge and understanding of home injury/symptom/pain management, positioning, posture/ergonomics, home safety, activity modification, transfer techniques, and joint protection strategies  to improve patient's ability to progress to PLOF and address remaining functional goals.  (see flow sheet as applicable)     Details if  applicable:     Total  50     Heat (UNBILLED):  location/position: C/S, supine with wedge .    Min Rationale   10 decrease pain and increase tissue extensibility to improve patient's ability to progress to PLOF and address remaining functional goals.     Skin assessment post-treatment:   Intact     Charge Capture    [x]   Patient Education billed concurrently with other procedures   [x]  Review HEP    []  Progressed/Changed HEP  []  Other:    Objective Information/Functional Measures/Assessment  Patient's surgeon's office contacted her during treatment to inform her that PT may now progress to allow for use of weights at clinician's discretion    See PN    Patient will continue to benefit from skilled PT services to modify and progress therapeutic interventions, analyze and address functional mobility deficits, analyze and address ROM deficits, analyze and address strength deficits, analyze and address soft tissue restrictions, analyze and cue for proper movement patterns, and instruct in home and community integration to address functional deficits and attain remaining goals.    Progress toward goals / Updated goals:  []   See Progress Note/Recertification    See PN    PLAN  yes Continue plan of care  []   Upgrade activities as tolerated  []   Discharge: See DC Note  []   Other:    Chalice Colt "BJ" Keandre Linden, DPT, Cert. MDT, Cert. DN, Cert. SMT, Dip. Osteopractic  04/26/2024    10:04 AM  If an interpreting service was utilized for treatment of this patient, the contents of this document represent the material reviewed with the patient via the interpreter.  Future Appointments   Date Time Provider Department Center   05/01/2024  9:20 AM Raeanne Bull, [place] [facility]    05/01/2024 10:00 AM Rex Castor, PTA [facility]   05/03/2024  8:00 AM Valma Gazella, PTA [facility]   05/03/2024  8:40 AM Raeanne Bull, [place] [facility]   05/08/2024  8:40 AM Rex Castor, PTA [facility]   05/08/2024  9:20 AM Raeanne Bull, [place]  [facility]   05/10/2024  8:00 AM Lear Prosper, PT [facility]   05/10/2024  8:40 AM Raeanne Bull, [place] [facility]   05/15/2024  8:40 AM Lear Prosper, PT [facility]   05/15/2024  9:20 AM Raeanne Bull, [place] [facility]   05/17/2024  8:00 AM Lear Prosper, PT [facility]   05/17/2024  8:40 AM Raeanne Bull, [place] [facility]

## 2024-04-26 NOTE — Progress Notes (Signed)
 [facility] - [facility]  [address] 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 They are compliant with HEP and activity modification. Their Quickdash score is 61%% (regression of 22%). Their right shoulder abduction is 135 degrees (increase of 48 deg.). They have 85 degrees of right shoulder IR (increase  of 75 deg.). Their MMT scores all maintained or improved (detailed in chart). Their right grip strength is 49# (increase of 5#). Their pinch strengths are- 3pt = 6# (maintained), lateral = 9# (decrease of 4#), and Tip = 3#(decrease of 2#). Pt would benefit from continued OT services to address UB strength       CURRENT STATUS  Patient will be independent with HEP for bilateral UE ROM/stretches/strengthening to increase ROM and strength for ADL/IADL tasks.   Status at Eval: food putty item HEP with medium soft putty    Status at PN (04/19/24): compliant, goal met     Patient will be independent in demonstrating and  performing activity modification strategies to aid in success for work, self-care, meal preparation and home management tasks.   Status at Eval: Not initiated    Status at PN (04/19/24): compliant, goal met      Long Term Goals: To be accomplished in 4-6 weeks  Patient will show a 10% or more improvement in QuickDASH score, demonstrating improve function to complete ADLs/IADLs.  Status at Eval: 39%    Status at PN (04/19/24): 61%     Pt will increase right shoulder abduction and IR by 15 deg or greater in order to return to PLOF.   Status at eval: abduction= 87 deg; IR= 10 degp!   Current Status (04/10/2024): pt able to perform towel slides with tension but no p!   Current Status (04/12/2024): pt able to perform towel slides with tension but no p!    Status at PN (04/19/24): abduction 135deg. Goal met, IR 85 deg. Goal met      Pt will increase strength of Right  UEs by 1/2 MMT grade or more in order to be able to push up from a chair.   Status at Eval: see MMT chart    Status at PN (04/19/24): see chart       Patient will increase Right hand grip strength to 55# or greater without increased pain to improve grasp on objects during ADL/IADL tasks.   Status at Eval: 44#p!    Status at PN (04/19/24): 49# , goal not met  Patient will increase  Right hand 3pt, lateral, and tip pinch strength by 3 pounds or greater to improve participation in meal preparation and home management tasks.  Status at Eval: 3pt= 6#; lateral= 13#; tip= 5#  Current Status (04/10/2024): pt able to use 4# clip with 3pt pinch with no p!   Current Status (04/12/2024): pt able to tip inch into 4# clip with no p!    Status at PN (04/19/24): 3pt pinch:6#  Lateral pinch:9#  Tip pinch: 3#, goal not met        Non-Medicare, ADD NEW GOALS, can adjust or add frequency duration, no signature required      New Goals to be achieved in __4__ WEEKS  Pt will increase strength of Right  UEs by 1/2 MMT grade or more in order to be able to push up from a chair.   Status at  Eval: see MMT chart    Status at PN (04/19/24): see chart       Patient will increase Right hand grip strength to 55# or greater without increased pain to improve grasp on objects during ADL/IADL tasks.   Status at Eval: 44#p!  Status at PN (04/19/24): 49# , goal not met       Patient will increase  Right hand 3pt, lateral, and tip pinch strength by 3 pounds or greater to improve participation in meal preparation and home management tasks.  Status at Eval: 3pt= 6#; lateral= 13#; tip= 5#  Status at PN (04/19/24): 3pt pinch:6#, Lateral pinch:9#, Tip pinch: 3#, goal not met     Frequency / Duration:   Patient to be seen   1-2   times per week for   4     WEEKS    RECOMMENDATIONS  Patient would benefit from the continuation of skilled rehab interventions for functional progress to achieving above stated clinically significant goals.  Continue per initial Plan of Care.    If you have any questions/comments please contact us  directly.  Thank you for allowing us  to assist in the care of your patient.    [place], [place]       04/26/2024       11:59 AM

## 2024-05-01 ENCOUNTER — Inpatient Hospital Stay: Payer: TRICARE (CHAMPUS) | Primary: Surgical

## 2024-05-01 ENCOUNTER — Inpatient Hospital Stay: Admit: 2024-05-01 | Payer: TRICARE (CHAMPUS) | Primary: Surgical

## 2024-05-01 NOTE — Progress Notes (Signed)
 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  Pt reports her ortho doc is proud of her progress with her neck  Soft neck brace that she wears at work and while sleeping  Shortened session d/t pt's late arrival off the bus     TREATMENT AREA =  Spinal stenosis, site unspecified [M48.00]  Generalized muscle weakness [M62.81]    OBJECTIVE      Therapeutic Procedures:  Tx Min Billable or 1:1 Min (if diff from Tx Min) Procedure, Rationale, Specifics   25  97110 Therapeutic Exercise (timed):  increase ROM, strength, coordination, and proprioception to grip  (see flow sheet as applicable)    Removal of 1" pegs with 50# gripper x30   UB theraband HEP with red band x10 each      10  97530 Therapeutic Activity (timed):  use of dynamic activities replicating functional movements to increase ability to complete ADLs/IADLs independently (see flow sheet as applicable)    1" pegs sets of 3-4 palm>digit x30                           TOTAL 1:1 TREATMENT TIME:  (Total Time - Paraffin/Vaso/Fluido)        35         Billed concurrently with other treatments Patient Education:  Reviewed HEP     Objective Information/Functional Measures/Assessment    Minimal drops with 1" pegs sets of 3-4   No difficulty with 50# gripper          Assessment/Plan:    Provided firm putty to mix with medium soft for just right challenge at home             []   See Progress Note/Re certification     If an interpreting service was utilized for treatment of this patient, the contents of  this document represent the material reviewed with the patient via the interpreter.      Patient will continue to benefit from skilled OT services to modify and progress therapeutic interventions, analyze and address ROM deficits, analyze and address strength deficits, analyze and cue for proper movement patterns, analyze and modify for postural abnormalities, and instruct in home and community integration  to attain remaining goals.   Progress toward goals / Updated goals:  Pt will increase strength of Right  UEs by 1/2 MMT grade or more in order to be able to push up from a chair.   Status at Eval: see MMT chart    Status at PN (04/19/24): see chart    05/01/24: initiated UB theraband HEP  Patient will increase Right hand grip strength to 55# or greater without increased pain to improve grasp on objects during ADL/IADL tasks.   Status at Eval: 44#p!  Status at PN (04/19/24): 49# , goal not met       Patient will increase  Right hand 3pt, lateral, and tip pinch strength by 3 pounds or greater to improve participation in meal preparation and home management tasks.  Status at Eval: 3pt= 6#; lateral= 13#; tip= 5#  Status at PN (04/19/24): 3pt pinch:6#, Lateral pinch:9#, Tip pinch: 3#, goal not met        PLAN  [x]   Continue Plan of Care  []   Upgrade activities as tolerated    []   Discharge due to :    []   Other:      Therapist: Raeanne Bull, OT    Date: 05/01/2024 Time: 10:14 AM     Future Appointments   Date Time Provider Department Center   05/01/2024 10:40 AM Rex Castor, PTA [facility]   05/03/2024  8:00 AM Valma Gazella, PTA [facility]   05/03/2024  8:40 AM Raeanne Bull, OT [facility]   05/08/2024  8:40 AM Rex Castor, PTA [facility]   05/08/2024  9:20 AM Raeanne Bull, [place] [facility]   05/10/2024  8:00 AM Lear Prosper, PT [facility]   05/10/2024  8:40 AM Raeanne Bull, [place] [facility]   05/15/2024  8:40 AM Lear Prosper, PT [facility]   05/15/2024  9:20 AM Raeanne Bull, OT [facility]    05/17/2024  8:00 AM Lear Prosper, PT [facility]   05/17/2024  8:40 AM [facility] PT [address] [facility]   .

## 2024-05-01 NOTE — Progress Notes (Signed)
 PHYSICAL THERAPY - DAILY TREATMENT NOTE (updated 1/23)    Patient Name: Colleen [name]    Date: 05/01/2024    DOB: 1961-10-15  Insurance: Payor: [employer]ARE EAST / Plan: [employer]ARE [employer] PRIME / Product Type: *No Product type* /      Patient DOB verified yes     Visit #   Current / Total 1 8   Time   In / Out 10:40 11:22   Pain   In / Out 0/10 0/10   Subjective Functional Status/Changes: Pt reports HEP is going well. She has no pain right now. Her MD gave her permission to use some weight.      TREATMENT AREA =  Spinal stenosis, unspecified spinal region    Next PN due: 05/26/24    OBJECTIVE    Therapeutic Procedures:  Tx Min Procedure, Rationale, Specifics   12 [id number] Therapeutic Exercise (timed):  increase ROM, strength, coordination, balance, and proprioception to improve patient's ability to progress to PLOF and address remaining functional goals. (see flow sheet as applicable)     Details if applicable:  seeFS     12 97530 Therapeutic Activity (timed):  use of dynamic activities replicating functional movements to increase ROM, strength, coordination, balance, and proprioception in order to improve patient's ability to progress to PLOF and address remaining functional goals.  (see flow sheet as applicable)     Details if applicable:  UBE, stretches     10 97112 Neuromuscular Re-Education (timed):  improve balance, coordination, kinesthetic sense, posture, core stability and proprioception to improve patient's ability to develop conscious control of individual muscles and awareness of position of extremities in order to progress to PLOF and address remaining functional goals. (see flow sheet as applicable)     Details if applicable:  scap re-ed, posture re-ed     8 97535 Self Care/Home Management (timed):  improve patient knowledge and understanding of home injury/symptom/pain management, positioning, posture/ergonomics, home safety, activity modification, transfer techniques, and joint protection strategies  to improve  patient's ability to progress to PLOF and address remaining functional goals.  (see flow sheet as applicable)     Details if applicable:  review HEP, added scap clocks, updated bands     Total42     Heat (UNBILLED):  location/position: C/S, supine with wedge .    Min Rationale   PD decrease pain and increase tissue extensibility to improve patient's ability to progress to PLOF and address remaining functional goals.     Skin assessment post-treatment:   Intact     Charge Capture    [x]   Patient Education billed concurrently with other procedures   [x]  Review HEP    []  Progressed/Changed HEP  [x]  Other: added RTB scapular clocks, showed her how to use [employer] GO    Objective Information/Functional Measures/Assessment  - challenged with unilateral shrug with wall wash OH  - pleased with posterior shoulder rolls  - initiated scapular clocks with RTB  - progressed to GTB supine scap stabs    Pt is progressing well in therapy. She is compliant with HEP which was updated today. Cervical ROM is progressing as well, required gentle TC to prevent compensations, and educated on performing to tolerance for stretch, not pain. Pt pleased that the numbness is gone, she initially had some pain then but that has resolved now too.     Patient will continue to benefit from skilled PT services to modify and progress therapeutic interventions, analyze and address functional mobility deficits, analyze and address  ROM deficits, analyze and address strength deficits, analyze and address soft tissue restrictions, analyze and cue for proper movement patterns, and instruct in home and community integration to address functional deficits and attain remaining goals.    Progress toward goals / Updated goals:  []   See Progress Note/Recertification  1. Patient to be Safe and Independent with HEP to self-manage symptoms and progress gains after DC.  Current: updated (05/01/24)    2. Patient to improve NDI score to < 24% indicate improved  functional status and independence.    3. Patient to report > 70% tolerance towards driving.    PLAN  yes Continue plan of care  []   Upgrade activities as tolerated  []   Discharge: See DC Note  []   Other:    Sheilda Deputy, LPTA    05/01/2024    6:57 AM  If an interpreting service was utilized for treatment of this patient, the contents of this document represent the material reviewed with the patient via the interpreter.  Future Appointments   Date Time Provider Department Center   05/01/2024  9:20 AM Raeanne Bull, [place] [facility]   05/01/2024 10:00 AM Rex Castor, PTA [facility]   05/03/2024  8:00 AM Valma Gazella, PTA [facility]   05/03/2024  8:40 AM Raeanne Bull, [place] [facility]   05/08/2024  8:40 AM Rex Castor, PTA [facility]   05/08/2024  9:20 AM Raeanne Bull, [place] [facility]   05/10/2024  8:00 AM Lear Prosper, PT [facility]   05/10/2024  8:40 AM Raeanne Bull, [place] [facility]   05/15/2024  8:40 AM Lear Prosper, PT [facility]   05/15/2024  9:20 AM Raeanne Bull, [place] [facility]   05/17/2024  8:00 AM Lear Prosper, PT [facility]   05/17/2024  8:40 AM [facility] PT [address] [facility]

## 2024-05-01 NOTE — Telephone Encounter (Signed)
 Spoke with patient about missed appt. Pt reports that she spoke with someone at the front desk last time she was here and requested to move all her appts until after 10 AM. Pt unsure of who she spoke to. Confirmed she was on her way for her PT appointment.

## 2024-05-03 ENCOUNTER — Inpatient Hospital Stay: Admit: 2024-05-03 | Payer: TRICARE (CHAMPUS) | Primary: Surgical

## 2024-05-03 NOTE — Progress Notes (Addendum)
 PHYSICAL THERAPY - DAILY TREATMENT NOTE (updated 1/23)    Patient Name: Colleen [name]    Date: 05/01/2024    DOB: 04/21/1961  Insurance: Payor: TRICARE EAST / Plan: [employer]ARE EAST PRIME / Product Type: *No Product type* /      Patient DOB verified yes     Visit #   Current / Total 2 8   Time   In / Out 8:00 8:40   Pain   In / Out 0/10 0/10   Subjective Functional Status/Changes: Pt reports she needs to get better at turning her head to look over her shoulder for return to driving. 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      TREATMENT AREA =  Spinal stenosis, unspecified spinal region    Next PN due: 05/26/24    OBJECTIVE    Therapeutic Procedures:  Tx Min Procedure, Rationale, Specifics   10 [id number] Therapeutic Exercise (timed):  increase ROM, strength, coordination, balance, and proprioception to improve patient's ability to progress to PLOF and address remaining functional goals. (see flow sheet as applicable)     Details if applicable:       15 97530 Therapeutic Activity (timed):  use of dynamic activities replicating functional movements to increase ROM, strength, coordination, balance, and proprioception in order to improve patient's ability to progress to PLOF and address remaining functional goals.  (see flow sheet as applicable)     Details if applicable:  open books for t/s mobility     15 97535 Self Care/Home Management (timed):  improve patient knowledge and understanding of home injury/symptom/pain management, positioning, posture/ergonomics, home safety, activity modification, transfer techniques, and joint protection strategies  to improve patient's ability to progress to PLOF and address remaining functional goals.  (see flow sheet as applicable)     Details if applicable:  Updated HEP with new interventions     Total 40       Charge  Capture    [x]   Patient Education billed concurrently with other procedures   [x]  Review HEP    []  Progressed/Changed HEP  []  Other:    Objective Information/Functional Measures/Assessment  Educated on c/s MET to improve rotation mobility; performed sitting and supine to block t/s rotation  Added in open books for t/s mobility  Educated on UT stretching by holding under chair and leaning away  Educated on lat stretch to improve OH reaching mobility on right  Provided BTB for home program    Pt progressing well towards all remaining goals in therapy. She needs to progress c/s rotation ROM in order to return to driving. She is learning ways to stretch muscles at insertions due to fusion limiting c/s AROM. 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     Patient will continue to benefit from skilled PT services to modify and progress therapeutic interventions, analyze and address functional mobility deficits, analyze and address ROM deficits, analyze and address strength deficits, analyze and address soft tissue restrictions, analyze and cue for proper movement patterns, and instruct in home and community integration to address functional deficits and attain remaining goals.    Progress toward goals / Updated goals:  [x]   See Progress Note/Recertification  1. Patient to be Safe and Independent with HEP to self-manage symptoms and progress gains after DC.  Current: updated (05/01/24)    2. Patient to improve NDI score to < 24% indicate improved functional status and independence.    3. Patient to report > 70% tolerance towards driving.    PLAN  yes Continue plan of care  []   Upgrade activities as tolerated  []   Discharge: See DC Note  []   Other:    Roby Chris, LPTA    05/01/2024    6:57 AM  If an interpreting service was utilized for treatment of this patient, the contents of this document represent the material reviewed with the patient via the  interpreter.  Future Appointments   Date Time Provider Department Center   05/01/2024  9:20 AM Raeanne Bull, [place] [facility]   05/01/2024 10:00 AM Rex Castor, PTA [facility]   05/03/2024  8:00 AM Valma Gazella, PTA [facility]   05/03/2024  8:40 AM Raeanne Bull, [place] [facility]   05/08/2024  8:40 AM Rex Castor, PTA [facility]   05/08/2024  9:20 AM Raeanne Bull, [place] [facility]   05/10/2024  8:00 AM Lear Prosper, PT [facility]   05/10/2024  8:40 AM Raeanne Bull, [place] [facility]   05/15/2024  8:40 AM Lear Prosper, PT [facility]   05/15/2024  9:20 AM Raeanne Bull, [place] [facility]   05/17/2024  8:00 AM Lear Prosper, PT [facility]   05/17/2024  8:40 AM [facility] PT [address] [facility]

## 2024-05-03 NOTE — Progress Notes (Signed)
 OCCUPATIONAL THERAPY - DAILY TREATMENT NOTE    Patient Name: Colleen [name]    Date: 05/03/2024    DOB: [birth year]  Insurance: Payor: TRICARE EAST / Plan: TR[employer]ARE EAST PRIME / Product Type: *No Product type* /        Ins Auth:  15 visits expires 05/22/24           Next PN Due By:                             Patient DOB verified Yes     Visit #   Current / Total 2 8   Time   In / Out 840 920   Total Treatment Time 40   Pain   In / Out 0 0   Subjective Functional Status/Changes: Ms. Colleen [name] gave my some great tips and tricks for stretching  Pt reports she has to type 225 words per minute      TREATMENT AREA =  Spinal stenosis, site unspecified [M48.00]  Generalized muscle weakness [M62.81]    OBJECTIVE      Therapeutic Procedures:  Tx Min Billable or 1:1 Min (if diff from Tx Min) Procedure, Rationale, Specifics   25  97110 Therapeutic Exercise (timed):  increase ROM, strength, coordination, and proprioception to grip  (see flow sheet as applicable)  Bilateral:  Towel slides with holds   Therabar 3 ways: green x10 with 2-3 second holds    Right: Removal of perfection pieces with 8# clip 3pt pinch    15  97530 Therapeutic Activity (timed):  use of dynamic activities replicating functional movements to increase ability to complete ADLs/IADLs independently (see flow sheet as applicable)  Bilateral:  Perfection with 60 second timer individual   Perfection sets of 3 palm>digit with timer                         TOTAL 1:1 TREATMENT TIME:  (Total Time - Paraffin/Vaso/Fluido)        40         Billed concurrently with other treatments Patient Education:  Reviewed HEP     Objective Information/Functional Measures/Assessment    Perfection in 60 seconds =12 pieces  Perfection sets of 3 palm>digit - 2 mins 1 second         Assessment/Plan:    Continue with therabar exercises            []   See Progress Note/Re certification     If an interpreting service was utilized for treatment of this patient, the contents of this document represent  the material reviewed with the patient via the interpreter.      Patient will continue to benefit from skilled OT services to modify and progress therapeutic interventions, analyze and address ROM deficits, analyze and address strength deficits, analyze and cue for proper movement patterns, analyze and modify for postural abnormalities, and instruct in home and community integration  to attain remaining goals.   Progress toward goals / Updated goals:  Pt will increase strength of Right  UEs by 1/2 MMT grade or more in order to be able to push up from a chair.   Status at Eval: see MMT chart    Status at PN (04/19/24): see chart    05/01/24: initiated UB theraband HEP      Patient will increase Right hand grip strength to 55# or greater without increased pain to improve grasp on objects  during ADL/IADL tasks.   Status at Eval: 44#p!  Status at PN (04/19/24): 49# , goal not met       Patient will increase  Right hand 3pt, lateral, and tip pinch strength by 3 pounds or greater to improve participation in meal preparation and home management tasks.  Status at Eval: 3pt= 6#; lateral= 13#; tip= 5#  Status at PN (04/19/24): 3pt pinch:6#, Lateral pinch:9#, Tip pinch: 3#, goal not met   05/03/24: perfection pieces removal with 3pt and 8# clip x30 without pain or difficulty        PLAN  [x]   Continue Plan of Care  []   Upgrade activities as tolerated    []   Discharge due to :    []   Other:      Therapist: Raeanne Bull, OT    Date: 05/03/2024 Time: 7:42 AM     Future Appointments   Date Time Provider Department Center   05/03/2024  8:00 AM Valma Gazella, PTA [facility]   05/03/2024  8:40 AM Raeanne Bull, OT [facility]   05/08/2024  8:40 AM Rex Castor, PTA [facility]   05/08/2024  9:20 AM Raeanne Bull, [place] [facility]   05/10/2024  8:00 AM Lear Prosper, PT [facility]   05/10/2024  8:40 AM Raeanne Bull, [place] [facility]   05/15/2024  8:40 AM Lear Prosper, PT [facility]   05/15/2024  9:20 AM Raeanne Bull, OT [facility]   05/17/2024  8:00 AM Lear Prosper, PT [facility]   05/17/2024  8:40 AM [facility] PT [address] [facility]   .

## 2024-05-08 ENCOUNTER — Inpatient Hospital Stay: Admit: 2024-05-08 | Payer: TRICARE (CHAMPUS) | Primary: Surgical

## 2024-05-08 NOTE — Progress Notes (Signed)
 OCCUPATIONAL THERAPY - DAILY TREATMENT NOTE    Patient Name: Colleen [name]    Date: 05/08/2024    DOB: 1961-06-11  Insurance: Payor: TRICARE EAST / Plan: TR[employer]ARE EAST PRIME / Product Type: *No Product type* /        Ins Auth:  15 visits expires 05/22/24          Next PN Due By:                             Patient DOB verified Yes     Visit #   Current / Total 3 8   Time   In / Out 0920 1000   Total Treatment Time 40   Pain   In / Out 0 0   Subjective Functional Status/Changes: I stretched really good during my PT session and feel really loose and my made 200 words!     TREATMENT AREA =  Spinal stenosis, site unspecified [M48.00]  Generalized muscle weakness [M62.81]    OBJECTIVE        Therapeutic Procedures:  Tx Min Billable or 1:1 Min (if diff from Tx Min) Procedure, Rationale, Specifics   32  97110 Therapeutic Exercise (timed):  increase ROM, strength, coordination, and proprioception to grip (see flow sheet as applicable)    Bilateral:  Velcro-board: 5x  back and forth with lateral pinch - small strip  Theraband wall: Shoulder flexion/abduction 10x ea - red theraband  Therabar 3 ways: green bar 15# 10x    Right:  Removal of 1" pegs with 55# gripper 15x  Removal of perfection pieces with 8# clip tip pinch - 25 pieces     8  97530 Therapeutic Activity (timed):  use of dynamic activities replicating functional movements to increase independence with ADLs/IADLs (see flow sheet as applicable)    Right:  1" pegs sets of 3 palm>digit x15  Perfection with 60 second timer  Perfection sets of 3 palm>digit without timer - 13 pieces                           TOTAL 1:1 TREATMENT TIME:  (Total Time - Paraffin/Vaso/Fluido)        40         Billed concurrently with other treatments Patient Education:  Reviewed HEP     Objective Information/Functional Measures/Assessment    Perfection in 60 seconds = 12 pieces  Fatigue after 10 pieces with 55# gripper. Able to complete after short rest           Assessment/Plan:    Discussed 3 more  visits left             []   See Progress Note/Re certification     If an interpreting service was utilized for treatment of this patient, the contents of this document represent the material reviewed with the patient via the interpreter.      Patient will continue to benefit from skilled OT services to modify and progress therapeutic interventions, analyze and address ROM deficits, analyze and address strength deficits, analyze and cue for proper movement patterns, analyze and modify for postural abnormalities, and instruct in home and community integration  to attain remaining goals.   Progress toward goals / Updated goals:    Pt will increase strength of Right  UEs by 1/2 MMT grade or more in order to be able to push up from a chair.   Status  at Eval: see MMT chart    Status at PN (04/19/24): see chart    05/01/24: initiated UB theraband HEP      Patient will increase Right hand grip strength to 55# or greater without increased pain to improve grasp on objects during ADL/IADL tasks.   Status at Eval: 44#p!  Status at PN (04/19/24): 49# , goal not met    05/08/24: 1" peg removal with 55# gripper 15x; fatigue but able to complete    Patient will increase  Right hand 3pt, lateral, and tip pinch strength by 3 pounds or greater to improve participation in meal preparation and home management tasks.  Status at Eval: 3pt= 6#; lateral= 13#; tip= 5#  Status at PN (04/19/24): 3pt pinch:6#, Lateral pinch:9#, Tip pinch: 3#, goal not met   05/03/24: perfection pieces removal with 3pt and 8# clip x30 without pain or difficulty   05/08/24: perfection pieces removal with tip pinch and 8# clips x15 without pain or difficulty     PLAN  [x]   Continue Plan of Care  []   Upgrade activities as tolerated    []   Discharge due to :    []   Other:      Therapist: Ettie Hermanns OTS    I was present during the entire treatment, directing and participating in the treatment.  Raeanne Bull OTR/L      Date: 05/08/2024 Time: 9:14 AM     Future  Appointments   Date Time Provider Department Center   05/08/2024  9:20 AM Raeanne Bull, [place] [facility]   05/10/2024  8:00 AM Lear Prosper, PT [facility]   05/10/2024  8:40 AM Raeanne Bull, [place] [facility]   05/15/2024  8:40 AM Lear Prosper, PT [facility]   05/15/2024  9:20 AM Raeanne Bull, [place] [facility]   05/17/2024  8:00 AM Lear Prosper, PT [facility]   05/17/2024  8:40 AM [facility] PT [address] [facility]

## 2024-05-08 NOTE — Progress Notes (Signed)
 PHYSICAL THERAPY - DAILY TREATMENT NOTE (updated 1/23)    Patient Name: Colleen [name]    Date: 05/01/2024    DOB: Feb 10, 1961  Insurance: Payor: TRICARE EAST / Plan: [employer]ARE [place] PRIME / Product Type: *No Product type* /      Patient DOB verified yes     Visit #   Current / Total 3 8   Time   In / Out 8:40 9:18   Pain   In / Out aching 0/10   Subjective Functional Status/Changes: Pt reports her legs are aching from the weather, she has arthritis.      TREATMENT AREA =  Spinal stenosis, unspecified spinal region    Next PN due: 05/26/24  Auth due (visit number/ date) 15v exp 01/23/24 - 05/22/24    OBJECTIVE    Therapeutic Procedures:  Tx Min Procedure, Rationale, Specifics   18 [id number] Therapeutic Exercise (timed):  increase ROM, strength, coordination, balance, and proprioception to improve patient's ability to progress to PLOF and address remaining functional goals. (see flow sheet as applicable)     Details if applicable:  see FS     10 97530 Therapeutic Activity (timed):  use of dynamic activities replicating functional movements to increase ROM, strength, coordination, balance, and proprioception in order to improve patient's ability to progress to PLOF and address remaining functional goals.  (see flow sheet as applicable)     Details if applicable:  open books for t/s mobility, UT S     10 97112 Neuromuscular Re-Education (timed):  improve balance, coordination, kinesthetic sense, posture, core stability and proprioception to improve patient's ability to develop conscious control of individual muscles and awareness of position of extremities in order to progress to PLOF and address remaining functional goals. (see flow sheet as applicable)    Details if applicable:  scap re-ed     Total   38       Charge Capture    [x]   Patient Education billed concurrently with other procedures   [x]  Review HEP    []  Progressed/Changed HEP  []  Other:    Objective Information/Functional Measures/Assessment  - increased to L3 UBE  - multi  set of shoulder shrugs to decrease tension  - pleased with open books  - fatigued with wall wash with OH shrug  - fatigued with RTB wall clocks  - initiated GTB pallof and (B)(U) TB chest press  - progressed to GTB row/ext  - cuing to prevent lateral trunk lean with UT S    Pt cont to progress well in therapy and remains compliant with HEP. Educated on making sure not add weight to her lanyard to prevent pressure on c/s. Fatigued with current program but no pain. Pleased with increased mobility at end session, stated she fells "loosey goosey." Will continue to progress as tolerated, with focus on increasing c/s rotation to aide in return to driving.     Patient will continue to benefit from skilled PT services to modify and progress therapeutic interventions, analyze and address functional mobility deficits, analyze and address ROM deficits, analyze and address strength deficits, analyze and address soft tissue restrictions, analyze and cue for proper movement patterns, and instruct in home and community integration to address functional deficits and attain remaining goals.    Progress toward goals / Updated goals:  [x]   See Progress Note/Recertification  1. Patient to be Safe and Independent with HEP to self-manage symptoms and progress gains after DC.  Current: updated (05/01/24)    2. Patient  to improve NDI score to < 24% indicate improved functional status and independence.    3. Patient to report > 70% tolerance towards driving.  Current: working on increasing c/s rotation to improve driving (0/45/40)    PLAN  yes Continue plan of care  []   Upgrade activities as tolerated  []   Discharge: See DC Note  []   Other:    Sheilda Deputy, LPTA   05/08/24   6:57 AM  If an interpreting service was utilized for treatment of this patient, the contents of this document represent the material reviewed with the patient via the interpreter.  Future Appointments   Date Time Provider Department Center   05/01/2024  9:20 AM  Raeanne Bull, [place] [facility]   05/01/2024 10:00 AM Rex Castor, PTA [facility] [place]   05/03/2024  8:00 AM Valma Gazella, PTA [facility]   05/03/2024  8:40 AM Raeanne Bull, [place] [facility]   05/08/2024  8:40 AM Rex Castor, PTA [facility]   05/08/2024  9:20 AM Raeanne Bull, [place] [facility]   05/10/2024  8:00 AM Lear Prosper, PT [facility]   05/10/2024  8:40 AM Raeanne Bull, [place] [facility]   05/15/2024  8:40 AM Lear Prosper, PT [facility]   05/15/2024  9:20 AM Raeanne Bull, [place] [facility]   05/17/2024  8:00 AM Lear Prosper, PT [facility]   05/17/2024  8:40 AM [facility] PT [address] [facility]

## 2024-05-10 ENCOUNTER — Inpatient Hospital Stay: Admit: 2024-05-10 | Payer: TRICARE (CHAMPUS) | Primary: Surgical

## 2024-05-10 NOTE — Progress Notes (Signed)
 OCCUPATIONAL THERAPY - DAILY TREATMENT NOTE    Patient Name: Colleen [name]    Date: 05/10/2024    DOB: [birth year]  Insurance: Payor: TRICARE EAST / Plan: [employer]ARE [employer] PRIME / Product Type: *No Product type* /        Ins Auth:  15 visits 05/22/24          Next PN Due By:                             Patient DOB verified Yes     Visit #   Current / Total 4 8   Time   In / Out 838 918   Total Treatment Time 40   Pain   In / Out 0 0   Subjective Functional Status/Changes: I feel worked today which is good because that means healing     TREATMENT AREA =  Spinal stenosis, site unspecified [M48.00]  Generalized muscle weakness [M62.81]    OBJECTIVE      Modalities Rationale:   decrease pain, increase tissue extensibility, and increase muscle contraction/control to improve the patient's ability to participation in Tx                                                                    1:1 time/Billable      min []  Estim, type/location:       [x]   att       []   w/ice    []   w/heat    min []   Ultrasound: Duty Cycle:  Frequency:  W/cm2                                                               Non 1:1 time/Non billable   Concurrent with TE min []   Ice     [x]   Heat     Location/Position:  Bilateral shoulders: Concurrent with TE                                                                Non 1:1 time/Billable    min []   Paraffin:       Location:      min []   Vasopneumatic Device Pressure/Temp:  Location:  Pre:  Post:      min []   Fluido/[place]: Location:  Position: AROM   [x]  Skin assessment post-treatment (if applicable):    [x]   intact    []   redness- no adverse reaction     [] redness - adverse reaction:          Therapeutic Procedures:  Tx Min Billable or 1:1 Min (if diff from Tx Min) Procedure, Rationale, Specifics   40  97110 Therapeutic Exercise (timed):  increase ROM, strength, coordination, and proprioception to grip (see flow sheet as applicable)  Bilateral:  Therabar 3 ways: blue bar 25# 10x each  Medium putty find  with bilateral hands to reveal and remove x15 1/4" pegs    Right:  Large hammer: Pronation/supination 10x each direction  Digiflex: green - 10x ea finger  Shoulder arc: 3# weight to biceps/triceps 5x ea arc                                TOTAL 1:1 TREATMENT TIME:  (Total Time - Paraffin/Vaso/Fluido)        40         Billed concurrently with other treatments Patient Education:  Reviewed HEP     Objective Information/Functional Measures/Assessment    Fatigue at 3 reps during exercise. Moved up the handle and did 4 more reps. Able to complete after short rest.         Assessment/Plan:    DC in 2 visits             []   See Progress Note/Re certification     If an interpreting service was utilized for treatment of this patient, the contents of this document represent the material reviewed with the patient via the interpreter.      Patient will continue to benefit from skilled OT services to modify and progress therapeutic interventions, analyze and address ROM deficits, analyze and address strength deficits, analyze and cue for proper movement patterns, analyze and modify for postural abnormalities, and instruct in home and community integration  to attain remaining goals.   Progress toward goals / Updated goals:    will increase strength of Right  UEs by 1/2 MMT grade or more in order to be able to push up from a chair.   Status at Eval: see MMT chart    Status at PN (04/19/24): see chart    05/01/24: initiated UB theraband HEP   05/10/24: Genella Kendall 3 way with blue therabar 25# 10x                Shoulder arc with 3# weight to biceps/triceps 5x ea arc    Patient will increase Right hand grip strength to 55# or greater without increased pain to improve grasp on objects during ADL/IADL tasks.   Status at Eval: 44#p!  Status at PN (04/19/24): 49# , goal not met    05/08/24: 1" peg removal with 55# gripper 15x; fatigue but able to complete  05/10/24: Large hammer - pronation/supination 10x ea     Patient will increase  Right hand  3pt, lateral, and tip pinch strength by 3 pounds or greater to improve participation in meal preparation and home management tasks.  Status at Eval: 3pt= 6#; lateral= 13#; tip= 5#  Status at PN (04/19/24): 3pt pinch:6#, Lateral pinch:9#, Tip pinch: 3#, goal not met   05/03/24: perfection pieces removal with 3pt and 8# clip x30 without pain or difficulty   05/08/24: perfection pieces removal with tip pinch and 8# clips x15 without pain or difficulty  05/10/24: Digiflex :green - 10x ea finger     PLAN  [x]   Continue Plan of Care  []   Upgrade activities as tolerated    []   Discharge due to :    []   Other:      Therapist: Ettie Hermanns, OTS    Date: 05/10/2024 Time: 7:44 AM     Future Appointments   Date Time Provider Department Center   05/10/2024  8:00 AM Aznar,  Chalice Colt, PT [facility]   05/10/2024  8:40 AM Raeanne Bull, [place] [facility]   05/15/2024  8:40 AM Lear Prosper, PT [facility]   05/15/2024  9:20 AM Raeanne Bull, [place] [facility]   05/17/2024  8:00 AM Lear Prosper, PT [facility]   05/17/2024  8:40 AM [facility] PT [address] [facility]

## 2024-05-10 NOTE — Progress Notes (Signed)
 PHYSICAL THERAPY - DAILY TREATMENT NOTE (updated 1/23)    Patient Name: Colleen [name]    Date: 05/10/2024    DOB: February 07, 1961  Insurance: Payor: TRICARE EAST / Plan: [employer]ARE [employer]AST PRIME / Product Type: *No Product type* /      Patient DOB verified yes     Visit #   Current / Total 8 8   Time   In / Out 7:50 8:30   Pain   In / Out 0/10 0/10   Subjective Functional Status/Changes: "I feel really good today, I was sore for about a day or two afterwards, but I am feeling like I am getting stronger. "     TREATMENT AREA =  Spinal stenosis, unspecified spinal region    Next PN due: 05/26/24  Auth due (visit number/ date) 15v exp 01/23/24 - 05/22/24    OBJECTIVE    Therapeutic Procedures:  Tx Min Procedure, Rationale, Specifics   10 [id number] Therapeutic Exercise (timed):  increase ROM, strength, coordination, balance, and proprioception to improve patient's ability to progress to PLOF and address remaining functional goals. (see flow sheet as applicable)     Details if applicable:    See flow sheet    10 97530 Therapeutic Activity (timed):  use of dynamic activities replicating functional movements to increase ROM, strength, coordination, balance, and proprioception in order to improve patient's ability to progress to PLOF and address remaining functional goals.  (see flow sheet as applicable)     Details if applicable:    Standing open books    10 97112 Neuromuscular Re-Education (timed):  improve balance, coordination, kinesthetic sense, posture, core stability and proprioception to improve patient's ability to develop conscious control of individual muscles and awareness of position of extremities in order to progress to PLOF and address remaining functional goals. (see flow sheet as applicable)     Details if applicable:     Total  30     Heat (UNBILLED):  location/position: Seated cervical spine  .    Min Rationale   10 decrease inflammation to improve patient's ability to progress to PLOF and address remaining functional goals.      Skin assessment post-treatment:   Intact     Charge Capture    [x]   Patient Education billed concurrently with other procedures   [x]  Review HEP    []  Progressed/Changed HEP  []  Other:    Objective Information/Functional Measures/Assessment    Progression go standing open books, require verbal cueing for proper foot set up and sequencing of movement. Pt noted having mild discomfort in the shoulder when at end range thoracic rotation. Relieved with staying within pain free ROM.     Progression as per flow sheet, with emphasis on standing upright tolerance and improve cervical proprioceptive during UE exercises.     PT responded well to progressions and verbalized good understanding of HEP and expectations of PT. Pt continues to be highly motivated to progress towards unmet goals.     Patient will continue to benefit from skilled PT services to modify and progress therapeutic interventions, analyze and address functional mobility deficits, analyze and address strength deficits, and analyze and cue for proper movement patterns to address functional deficits and attain remaining goals.    Progress toward goals / Updated goals:  []   See Progress Note/Recertification    1. Patient to be Safe and Independent with HEP to self-manage symptoms and progress gains after DC.       2. Patient to improve NDI  score to < 24% indicate improved functional status and independence.     3. Patient to report > 70% tolerance towards driving.  Current: Progressed to standing open books (05/10/24)    PLAN  yes Continue plan of care  [x]   Upgrade activities as tolerated  []   Discharge: See DC Note  []   Other:    Matias Soman SPT  Dave "BJ" Aznar, DPT, Cert. MDT, Cert. DN, Cert. SMT, Dip. Osteopractic    05/10/2024    7:51 AM  If an interpreting service was utilized for treatment of this patient, the contents of this document represent the material reviewed with the patient via the interpreter.  Future Appointments   Date Time Provider  Department Center   05/10/2024  8:00 AM Lear Prosper, PT [facility]   05/10/2024  8:40 AM Raeanne Bull, [place] [facility]   05/15/2024  8:40 AM Lear Prosper, PT [facility]   05/15/2024  9:20 AM Raeanne Bull, [place] [facility]   05/17/2024  8:00 AM Lear Prosper, PT [facility]   05/17/2024  8:40 AM [facility] PT [address] [facility]

## 2024-05-15 ENCOUNTER — Inpatient Hospital Stay: Admit: 2024-05-15 | Payer: TRICARE (CHAMPUS) | Primary: Surgical

## 2024-05-15 NOTE — Progress Notes (Signed)
 PHYSICAL THERAPY - DAILY TREATMENT NOTE (updated 1/23)    Patient Name: Colleen [name]    Date: 05/15/2024    DOB: 12-Nov-1961  Insurance: Payor: TRICARE EAST / Plan: TR[employer]ARE EAST PRIME / Product Type: *No Product type* /      Patient DOB verified yes     Visit #   Current / Total 9 15   Time   In / Out 8:40 9:15   Pain   In / Out 0/10 0/10   Subjective Functional Status/Changes: "I am feeling tierd today and having some neck stiffness. I feel like it is because of the weather. I feel like I'm having difficulty with turning my head during the stormy weather. "     TREATMENT AREA =  Spinal stenosis, unspecified spinal region    Next PN due: 05/26/24   Auth due (visit number/ date) 15v exp 01/23/24 - 05/22/24   OBJECTIVE    Therapeutic Procedures:  Tx Min Procedure, Rationale, Specifics   10 [id number] Therapeutic Exercise (timed):  increase ROM, strength, coordination, balance, and proprioception to improve patient's ability to progress to PLOF and address remaining functional goals. (see flow sheet as applicable)     Details if applicable:    See flow sheet    10 97530 Therapeutic Activity (timed):  use of dynamic activities replicating functional movements to increase ROM, strength, coordination, balance, and proprioception in order to improve patient's ability to progress to PLOF and address remaining functional goals.  (see flow sheet as applicable)     Details if applicable:       10 97535 Self Care/Home Management (timed):  improve patient knowledge and understanding of activity modification  to improve patient's ability to progress to PLOF and address remaining functional goals.  (see flow sheet as applicable)     Details if applicable:     Total  30     Heat (UNBILLED):  location/position: Supine  .    Min Rationale   10 increase tissue extensibility to improve patient's ability to progress to PLOF and address remaining functional goals.     Skin assessment post-treatment:   Intact     Charge Capture    [x]   Patient  Education billed concurrently with other procedures   [x]  Review HEP    []  Progressed/Changed HEP  []  Other:    Objective Information/Functional Measures/Assessment  Pt noted difficulty with rotating neck L>R. Re-introduced cervical retractions in seating and UT Multi Shrugs and Rolls aiding in alleviating cervical stiffness.     Added Seated thoracic extensions w/ ball, to improve thoracic mobility in the sagittal plane. Pt noted relief of upper thoracic mobility.    Pt educated on positional adjustment of HEP to allow pt to perform HEP whenever s/x's occur. Pt verbalized compliance and understanding of PT expectations.     Pt demonstrated overall good tolerance to today's session. Pt continues to display difficulty with scapular stabilization/proprioception , highlighted with VC/TC for scapular positioning during wall wash and scapular clocks.     Patient will continue to benefit from skilled PT services to modify and progress therapeutic interventions, analyze and address functional mobility deficits, analyze and address ROM deficits, and analyze and address soft tissue restrictions to address functional deficits and attain remaining goals.    Progress toward goals / Updated goals:  []   See Progress Note/Recertification      1. Patient to be Safe and Independent with HEP to self-manage symptoms and progress gains after DC.   -Pt  verbalized HEP routine and demonstrated good recall of exercises performed. (05/15/24)     2. Patient to improve NDI score to < 24% indicate improved functional status and independence.     3. Patient to report > 70% tolerance towards driving.  Current: Progressed to standing open books (05/10/24)    PLAN  yes Continue plan of care  [x]   Upgrade activities as tolerated  []   Discharge: See DC Note  []   Other:    Matias Soman SPT   Ladd Cen "BJ" Jodel Mayhall, DPT, Cert. MDT, Cert. DN, Cert. SMT, Dip. Osteopractic    05/15/2024    8:44 AM  If an interpreting service was utilized for treatment of this  patient, the contents of this document represent the material reviewed with the patient via the interpreter.  Future Appointments   Date Time Provider Department Center   05/15/2024  9:20 AM Raeanne Bull, [place] [facility]   05/17/2024  8:00 AM Lear Prosper, PT [facility]   05/17/2024  8:40 AM [facility] [address] [facility]

## 2024-05-15 NOTE — Progress Notes (Signed)
 OCCUPATIONAL THERAPY - DAILY TREATMENT NOTE    Patient Name: Colleen [name]    Date: 05/15/2024    DOB: [birth year]  Insurance: Payor: TRICARE EAST / Plan: [employer]ARE [employer]AST PRIME / Product Type: *No Product type* /        Ins Auth:  15 visits 05/22/24          Next PN Due By:                    05/17/24         Patient DOB verified Yes     Visit #   Current / Total 5 8   Time   In / Out 920 1000   Total Treatment Time 40   Pain   In / Out 0 0   Subjective Functional Status/Changes: I woke up feeling a little sore today in my neck     TREATMENT AREA =  Spinal stenosis, site unspecified [M48.00]  Generalized muscle weakness [M62.81]    OBJECTIVE        Therapeutic Procedures:  Tx Min Billable or 1:1 Min (if diff from Tx Min) Procedure, Rationale, Specifics   32  97110 Therapeutic Exercise (timed):  increase ROM, strength, coordination, and proprioception to grip (see flow sheet as applicable)    Bilateral:  Shoulder shrugs 20x  Therabar 3 ways: blue bar 25# 10x each    Right:  [place]o tree - 3# weight to biceps/triceps 1x up/down  Large hammer: pronation/supination 10x ea  Squigz - 15x using 3pt pinch  1" Pegs and gripper - 55# 15x to remove pegs  Clips and sponges - 20x with 8# clip using right tip pinch       8  97535 Self Care/Home Management (timed):  improve patient knowledge and understanding of home injury/symptom/pain management, positioning, posture/ergonomics, and activity modification  to increase ability to complete ADLs/IADLs independently  (see flow sheet as applicable)     Discussed proper breathing techniques, posture, postioning, and activity modification for self care and HEP                           TOTAL 1:1 TREATMENT TIME:  (Total Time - Paraffin/Vaso/Fluido)        40         Billed concurrently with other treatments Patient Education:  Reviewed HEP     Objective Information/Functional Measures/Assessment    Fatigue at 3 reps during hammer exercise. Moved hand up and was able to complete last 7  repetitions  Fatigue and rest breaks during 55# gripper and peg removal         Assessment/Plan:    DC NEXT VISIT             []   See Progress Note/Re certification     If an interpreting service was utilized for treatment of this patient, the contents of this document represent the material reviewed with the patient via the interpreter.      Patient will continue to benefit from skilled OT services to modify and progress therapeutic interventions, analyze and address ROM deficits, analyze and address strength deficits, analyze and cue for proper movement patterns, analyze and modify for postural abnormalities, and instruct in home and community integration  to attain remaining goals.   Progress toward goals / Updated goals:    4. Patient will increase strength of Right  UEs by 1/2 MMT grade or more in order to be able to  push up from a chair.   Status at Eval: see MMT chart    Status at PN (04/19/24): see chart    05/01/24: initiated UB theraband HEP   05/10/24: Genella Kendall 3 way with blue therabar 25# 10x                Shoulder arc with 3# weight to biceps/triceps 5x ea arc  05/15/24: Adolph Hoop tree  3# weight to biceps/triceps without difficulty     Patient will increase Right hand grip strength to 55# or greater without increased pain to improve grasp on objects during ADL/IADL tasks.   Status at Eval: 44#p!  Status at PN (04/19/24): 49# , goal not met    05/08/24: 1" peg removal with 55# gripper 15x; fatigue but able to complete  05/10/24: Large hammer - pronation/supination 10x ea  05/15/24: fatigue with large hammer sup/pro x10     Patient will increase  Right hand 3pt, lateral, and tip pinch strength by 3 pounds or greater to improve participation in meal preparation and home management tasks.  Status at Eval: 3pt= 6#; lateral= 13#; tip= 5#  Status at PN (04/19/24): 3pt pinch:6#, Lateral pinch:9#, Tip pinch: 3#, goal not met   05/03/24: perfection pieces removal with 3pt and 8# clip x30 without pain or difficulty   05/08/24:  perfection pieces removal with tip pinch and 8# clips x15 without pain or difficulty  05/10/24: Digiflex :green - 10x ea finger  05/15/24: Clips & sponges 8# clip 20x, pt reported no difficulty, pain, or fatigue     PLAN  [x]   Continue Plan of Care  []   Upgrade activities as tolerated    []   Discharge due to :    []   Other:      Therapist: Ettie Hermanns, OTS    Date: 05/15/2024 Time: 8:54 AM     Future Appointments   Date Time Provider Department Center   05/15/2024  9:20 AM Raeanne Bull, OT [facility]   05/17/2024  8:00 AM Lear Prosper, PT [facility]   05/17/2024  8:40 AM [facility] [address] [facility]

## 2024-05-17 ENCOUNTER — Inpatient Hospital Stay: Admit: 2024-05-17 | Payer: TRICARE (CHAMPUS) | Primary: Surgical

## 2024-05-17 NOTE — Progress Notes (Cosign Needed Addendum)
 [facility] PHYSICAL THERAPY  [address] [place], [place] [address] [phone] Fx: [phone]  DISCHARGE SUMMARY  Patient Name: Colleen [name] DOB: December 04, 1961   Treatment/Medical Diagnosis: Spinal stenosis, site unspecified [M48.00]  Neck pain [M54.2]   Referral Source: Meridee Standing I*     Date of Initial Visit: 03/22/24 Attended Visits: 10 Missed Visits: 3     SUMMARY OF TREATMENT  Patient's POC has consisted of therex, therapeutic activities, manual therapy prn, modalities prn, pt. education, and a comprehensive HEP. Treatment strategies used to address functional mobility deficits, ROM deficits, strength deficits, analyze and address soft tissue restrictions, analyze and cue movement patterns, analyze and modify body mechanics/ergonomics, assess and modify postural abnormalities and instruct in home and community integration.   CURRENT STATUS  Discussed with patient continuation of HEP and strategies of modification of program to progress/regress. Pt informed about required  AROM cervical rotation in order to drive safely and check blind spots. Pt reported good understanding of requirements, but verbalized not wanting to drive prior to clearance from surgeon in July. Pt demonstrates good understanding of recommendations and DPT/SPT expectations for DC.     GOALS/MEASURE OF PROGRESS Goal Met?   1. Patient to be Safe and Independent with HEP to self-manage symptoms and progress gains after DC.   -Pt verbalized HEP routine and demonstrated good recall of exercises performed. (05/15/24)     2. Patient to improve NDI score to < 24% indicate improved functional status and independence.     3. Patient to report > 70% tolerance towards driving.  Current: Progressed to standing open books (05/10/24)     MET          MET          MET     RECOMMENDATIONS  Pt is to DC from POC and continue with updated HEP as tolerated. Pt advised for trial of participation of pool  activities and returning to light gym activities with supervision.     If you have any questions/comments please contact us  directly at [phone].   Thank you for allowing us  to assist in the care of your patient.  Therapist Signature: Chalice Colt "BJ" Kayon Dozier, DPT, Cert. MDT, Cert. DN, Cert. SMT, Dip. Osteopractic  Matias Soman SPT Date: 05/17/24   Reporting Period:  Certification Period: 04/26/24-05/17/24  N/a   Time: 8:02 AM     NOTE TO PHYSICIAN:  PLEASE COMPLETE THE ORDERS BELOW AND FAX TO   [facility]: [phone]  If you are unable to process this request in 24 hours please contact our office: [phone]    ___ I have read the above report and request that my patient continue as recommended.   ___ I have read the above report and request that my patient continue therapy with the following changes/special instructions:_________________________________________________________   ___ I have read the above report and request that my patient be discharged from therapy.     Physician Signature:        Date:     Time:

## 2024-05-17 NOTE — Progress Notes (Addendum)
 PHYSICAL THERAPY - DAILY TREATMENT NOTE (updated 1/23)    Patient Name: Colleen [name]    Date: 05/17/2024    DOB: 1961-10-18  Insurance: Payor: [employer] / Plan: [employer] PRIME / Product Type: *No Product type* /      Patient DOB verified yes     Visit #   Current / Total 10 15   Time   In / Out 7:55 8:40   Pain   In / Out 0 0   Subjective Functional Status/Changes: "I feel good today, not having any soreness with my Upper body. I'm having some soreness on my inner thigh, but nothing to write off about."     TREATMENT AREA =  Spinal stenosis, unspecified spinal region    Next PN due: 05/26/24   Auth due (visit number/ date) 15v exp 01/23/24 - 05/22/24     OBJECTIVE    Therapeutic Procedures:  Tx Min Billable or 1:1 Min (if diff from Tx Min) Procedure, Rationale, Specifics   12  97710 Therapeutic Exercise (timed):  increase ROM, strength, coordination, balance, and proprioception to improve patient's ability to progress to PLOF and address remaining functional goals. (see flow sheet as applicable)     Details if applicable:       8  97530 Therapeutic Activity (timed):  use of dynamic activities replicating functional movements to increase ROM, strength, coordination, balance, and proprioception in order to improve patient's ability to progress to PLOF and address remaining functional goals.  (see flow sheet as applicable)     Details if applicable:     10  97535 Self Care/Home Management (timed):  improve patient knowledge and understanding of home injury/symptom/pain management, positioning, posture/ergonomics, home safety, activity modification, transfer techniques, and joint protection strategies  to improve patient's ability to progress to PLOF and address remaining functional goals.  (see flow sheet as applicable)     Details if applicable:     30  [facility] BC Totals Reminder: bill using total billable min of TIMED therapeutic procedures (example: do not include dry needle or estim unattended, both untimed codes, in  totals to left)  8-22 min = 1 unit; 23-37 min = 2 units; 38-52 min = 3 units; 53-67 min = 4 units; 68-82 min = 5 units   Total Total       Heat (UNBILLED):  location/position: Seated Cervical  .    Min Rationale   10 decrease inflammation and increase tissue extensibility to improve patient's ability to progress to PLOF and address remaining functional goals.     Skin assessment post-treatment:   Intact     Charge Capture    [x]   Patient Education billed concurrently with other procedures   [x]  Review HEP    []  Progressed/Changed HEP  []  Other:    Objective Information/Functional Measures/Assessment    See DC     Progress toward goals / Updated goals:  []   See Progress Note/Recertification    See DC     PLAN  no Continue plan of care  []   Upgrade activities as tolerated  [x]   Discharge: See DC Note  []   Other:    Matias Soman SPT  Dewarren Ledbetter "BJ" Che Below, DPT, Cert. MDT, Cert. DN, Cert. SMT, Dip. Osteopractic    05/17/2024    8:00 AM  If an interpreting service was utilized for treatment of this patient, the contents of this document represent the material reviewed with the patient via the interpreter.  Future Appointments   Date  Time Provider Department Center   05/17/2024  8:40 AM [facility] [address] [facility]

## 2024-05-17 NOTE — Progress Notes (Signed)
 Occupational Therapy Discharge Instructions      [facility] - [facility]  [address]  [place], [place] [address]  [phone]  [phone] fax    Patient: Colleen [name]  DOB: 10-Dec-1961      Continue Home Exercise Program 2 times per day for 4 weeks, then decrease to 1-2 times per week      Continue with    []  Ice  as needed 1-2 times per day     [x]  Heat           Follow up with MD:     []  Upon completion of therapy     [x]  As needed      Recommendations:     [x]    Return to activity with home program    []    Return to activity with the following modifications:       [] Post Rehab Program    [] Join Independent aquatic program     [] Return to/join local gym        Additional Comments:     Continue with exercises and rest breaks when needed.   Thank you for your participations!          Anniece Base, OTA 05/17/2024 8:54 AM

## 2024-05-17 NOTE — Progress Notes (Signed)
 Physical Therapy Discharge Instructions      [facility] - [facility]   [address]  [place]leen [name] [address]  [phone] fax            Continue Home Exercise Program 1-3 times per day for 4 weeks, then decrease to 3-5 times per week      Continue with    []  Ice  as needed or 2-3 times per day     [x]  Heat           Follow up with MD:     []  Upon completion of therapy     [x]  As needed      Recommendations:     [x]    Return to activity with home program    [x]    Return to activity with the following modifications:       [] Post Rehab Program    [] Join Independent aquatic program     [x] Return to/join local gym        Additional Comments:           Colleen [name], DPT, Cert. MDT, Cert. DN, Cert. SMT, Dip. Osteopractic  Matias Soman SPT  Date/Time:  05/17/24 8:40

## 2024-05-17 NOTE — Progress Notes (Addendum)
 OT DISCHARGE DAILY NOTE AND SUMMARY 01-23    Date:05/17/2024  Patient name: Colleen [name] Start of Care: 03/22/2024    Referral source: Colleen [name]*  PCP: Colleen Flick, PA DOB: Dec 27, 1960   Medical/Treatment Diagnosis: Spinal stenosis, site unspecified [M48.00]  Generalized muscle weakness [M62.81] Onset Date:01/11/24      Prior Hospitalization: see medical history Provider#: [id number]       Comorbidities: C/S radiculopathy, Diabetes 2, Hyperlipidemia, LBP, Diabetic Peripheral Neuropathy, GERD, Anxiety, Bipolar disorder   Prior Level of Function:cleared to drive (patient can walk to work), Tax adviser, sign language; lives alone on second floor apartment, has adult twin daughters, 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  If yes, update Summary List no       TREATMENT AREA =  Spinal stenosis, site unspecified [M48.00]  Generalized muscle weakness [M62.81]    OBJECTIVE        Therapeutic Procedures:  Tx Min Billable or 1:1 Min (if diff from Tx Min) Procedure, Rationale, Specifics   25  97110 Therapeutic Exercise (timed):  increase ROM, strength, coordination, and proprioception to  increase independence for ADL/IADL tasks (see flow sheet as applicable)    HEP's   Recheck for discharge            17  97535 Self Care/Home Management (timed):  improve patient knowledge and understanding of home injury/symptom/pain management, positioning, posture/ergonomics, and activity modification  to increase ability to complete ADLs/IADLs independently  (see flow sheet as applicable)    Recheck for discharge                42  [facility] BC Totals Reminder: bill using total billable min of TIMED therapeutic procedures (example: do not include dry needle or estim unattended, both untimed codes, in totals to left)  8-22 min = 1 unit; 23-37 min = 2 units; 38-52 min = 3 units; 53-67 min = 4 units; 68-82 min = 5 units   Total Total     [x]   Patient Education billed concurrently with other procedures   [x]  Review HEP    [x]  Progressed/Changed HEP, detail:  upgraded resistance therex band for UE strengthening HEP.   []  Other detail:       Objective Information/Functional Measures/Assessment    Right hand pinch strength: tip 9# , 3pt 10#, lateral 14#  Right hand grip:  71# (+27)    MMT:  right UE shoulder flexion 4+, extension 5 , shoulder abduction 4- , 4 shoulder adduction, 5 biceps flexion and extension, 5 wrist flexion/extension.    Assessment: Patient currently has full ROM of bilateral Ue's,  pt demonstrates increased right hand grip strength, increased in right UE strength (shoulder, elbow, wrist), increased right hand pinch strength, compliant with HEP's, patient educated on activity modification strategies and implements at home, increased independence with ADL's.     GOALS    4. Patient will increase strength  of Right  UEs by 1/2 MMT grade or more in order to be able to push up from a chair.   Status at Eval: see MMT chart    Status at PN (04/19/24): see chart    05/01/24: initiated UB theraband HEP   05/10/24: Genella Kendall 3 way with blue therabar 25# 10x                Shoulder arc with 3# weight to biceps/triceps 5x ea arc  05/15/24: Saebo tree  3# weight to biceps/triceps without difficulty  Status at discharge 05/17/2024: improved, right UE shoulder flexion 4+, extension 5 , shoulder abduction 4- , 4 shoulder adduction, 5 biceps flexion and extension, 5 wrist flexion/extension. Goal met.      Patient will increase Right hand grip strength to 55# or greater without increased pain to improve grasp on objects during ADL/IADL tasks.   Status at  Eval: 44#p!  Status at PN (04/19/24): 49# , goal not met    05/08/24: 1" peg removal with 55# gripper 15x; fatigue but able to complete  05/10/24: Large hammer - pronation/supination 10x ea  05/15/24: fatigue with large hammer sup/pro x10  Status at discharge 05/17/2024: 71# (+27). Goal met.      Patient will increase  Right hand 3pt, lateral, and tip pinch strength by 3 pounds or greater to improve participation in meal preparation and home management tasks.  Status at Eval: 3pt= 6#; lateral= 13#; tip= 5#  Status at PN (04/19/24): 3pt pinch:6#, Lateral pinch:9#, Tip pinch: 3#, goal not met   05/03/24: perfection pieces removal with 3pt and 8# clip x30 without pain or difficulty   05/08/24: perfection pieces removal with tip pinch and 8# clips x15 without pain or difficulty  05/10/24: Digiflex :green - 10x ea finger  05/15/24: Clips & sponges 8# clip 20x, pt reported no difficulty, pain, or fatigue  Status at discharge 05/17/2024: tip 9# (+4) , 3pt 10# (+4), lateral 14# (+1).  Goal met for tip and 3pt pinch.        RECOMMENDATIONS  Discontinue therapy. Progressing towards or have reached established goals.    Certification period: n/a    Anniece Base, OTA       05/17/2024       8:46 AM    Thank you for this referral!
